# Patient Record
Sex: Male | Born: 1940
Health system: Southern US, Community
[De-identification: ages and names within clinical notes are randomized; demographics above are authoritative.]

## PROBLEM LIST (undated history)

## (undated) DIAGNOSIS — Z8679 Personal history of other diseases of the circulatory system: Secondary | ICD-10-CM

## (undated) DIAGNOSIS — I639 Cerebral infarction, unspecified: Secondary | ICD-10-CM

## (undated) DIAGNOSIS — E785 Hyperlipidemia, unspecified: Secondary | ICD-10-CM

## (undated) DIAGNOSIS — G4733 Obstructive sleep apnea (adult) (pediatric): Secondary | ICD-10-CM

## (undated) DIAGNOSIS — F329 Major depressive disorder, single episode, unspecified: Secondary | ICD-10-CM

## (undated) DIAGNOSIS — H9190 Unspecified hearing loss, unspecified ear: Secondary | ICD-10-CM

## (undated) DIAGNOSIS — F4310 Post-traumatic stress disorder, unspecified: Secondary | ICD-10-CM

## (undated) HISTORY — PX: KNEE SURGERY: SHX244

## (undated) HISTORY — PX: HAND SURGERY: SHX662

## (undated) HISTORY — DX: Personal history of other diseases of the circulatory system: Z86.79

## (undated) HISTORY — DX: Hyperlipidemia, unspecified: E78.5

## (undated) HISTORY — PX: SHOULDER ARTHROSCOPY WITH ROTATOR CUFF REPAIR: SHX5685

## (undated) HISTORY — DX: Obstructive sleep apnea (adult) (pediatric): G47.33

## (undated) HISTORY — DX: Major depressive disorder, single episode, unspecified: F32.9

## (undated) HISTORY — DX: Unspecified hearing loss, unspecified ear: H91.90

## (undated) HISTORY — DX: Cerebral infarction, unspecified: I63.9

---

## 1960-11-04 HISTORY — PX: BACK SURGERY: SHX140

## 2004-04-18 ENCOUNTER — Encounter: Admission: RE | Admit: 2004-04-18 | Discharge: 2004-06-29 | Payer: Self-pay | Admitting: Orthopedic Surgery

## 2010-05-16 ENCOUNTER — Encounter: Payer: Self-pay | Admitting: Family Medicine

## 2010-05-20 ENCOUNTER — Encounter: Payer: Self-pay | Admitting: Emergency Medicine

## 2010-05-20 ENCOUNTER — Inpatient Hospital Stay (HOSPITAL_COMMUNITY): Admission: EM | Admit: 2010-05-20 | Discharge: 2010-05-22 | Payer: Self-pay | Admitting: Internal Medicine

## 2010-05-21 ENCOUNTER — Encounter (INDEPENDENT_AMBULATORY_CARE_PROVIDER_SITE_OTHER): Payer: Self-pay | Admitting: Internal Medicine

## 2010-05-22 ENCOUNTER — Ambulatory Visit: Payer: Self-pay | Admitting: Physical Medicine & Rehabilitation

## 2010-11-29 ENCOUNTER — Ambulatory Visit
Admission: RE | Admit: 2010-11-29 | Discharge: 2010-11-29 | Payer: Self-pay | Source: Home / Self Care | Attending: Family Medicine | Admitting: Family Medicine

## 2010-11-29 DIAGNOSIS — Z8679 Personal history of other diseases of the circulatory system: Secondary | ICD-10-CM | POA: Insufficient documentation

## 2010-11-29 DIAGNOSIS — F3289 Other specified depressive episodes: Secondary | ICD-10-CM

## 2010-11-29 DIAGNOSIS — G4733 Obstructive sleep apnea (adult) (pediatric): Secondary | ICD-10-CM | POA: Insufficient documentation

## 2010-11-29 DIAGNOSIS — F329 Major depressive disorder, single episode, unspecified: Secondary | ICD-10-CM | POA: Insufficient documentation

## 2010-11-29 DIAGNOSIS — F32A Depression, unspecified: Secondary | ICD-10-CM | POA: Insufficient documentation

## 2010-11-29 DIAGNOSIS — E785 Hyperlipidemia, unspecified: Secondary | ICD-10-CM | POA: Insufficient documentation

## 2010-11-29 HISTORY — DX: Hyperlipidemia, unspecified: E78.5

## 2010-11-29 HISTORY — DX: Other specified depressive episodes: F32.89

## 2010-11-29 HISTORY — DX: Obstructive sleep apnea (adult) (pediatric): G47.33

## 2010-11-29 HISTORY — DX: Major depressive disorder, single episode, unspecified: F32.9

## 2010-11-29 HISTORY — DX: Personal history of other diseases of the circulatory system: Z86.79

## 2010-12-06 NOTE — Assessment & Plan Note (Signed)
Summary: to be est/medicare pt/njr   Vital Signs:  Patient profile:   70 year old male Height:      68.25 inches Weight:      234 pounds BMI:     35.45 Temp:     98.1 degrees F oral Pulse rate:   72 / minute Pulse rhythm:   regular Resp:     12 per minute BP sitting:   100 / 70  (left arm) Cuff size:   large  Vitals Entered By: Sid Falcon LPN (November 29, 2010 11:17 AM)  Nutrition Counseling: Patient's BMI is greater than 25 and therefore counseled on weight management options.   History of Present Illness: New pt to establish care.  PMH reviewed. Had R CVA 7/11 with L hemiplegia.  Has had about 75 % recovery.. Pt had OT and PT and continues with several; home exercises. Recent change Simvastatin to Lipitor for lipids.  Tolerating well. This was just changed about one month ago and pt is nonfasting at this time. LDL was above goal of <100 prompting change. Hx hyperlipidema, CVA, depression, and OSA.  Uses CPAP and that is working well.  Depression is stable on medication.  Preventive Screening-Counseling & Management  Alcohol-Tobacco     Smoking Status: quit     Year Started: 1953     Year Quit: 1985  Caffeine-Diet-Exercise     Does Patient Exercise: no  Allergies (verified): No Known Drug Allergies  Past History:  Family History: Last updated: 11/29/2010 Adopted.  Social History: Last updated: 11/29/2010 Married Former Smoker Alcohol use-no Regular exercise-no  Risk Factors: Exercise: no (11/29/2010)  Risk Factors: Smoking Status: quit (11/29/2010)  Past Medical History: Chicken pos depression  Hyperlipidemia Cerebrovascular accident, hx of, 05/18/2010, left arm weakness left hemiplegia Obstructive sleep apnea PMH-FH-SH reviewed for relevance  Family History: Adopted.  Social History: Married Former Smoker Alcohol use-no Regular exercise-no Smoking Status:  quit Does Patient Exercise:  no  Review of Systems  The patient denies  anorexia, fever, weight loss, weight gain, vision loss, hoarseness, chest pain, syncope, dyspnea on exertion, peripheral edema, prolonged cough, headaches, hemoptysis, abdominal pain, melena, hematochezia, severe indigestion/heartburn, hematuria, incontinence, muscle weakness, suspicious skin lesions, transient blindness, difficulty walking, depression, unusual weight change, abnormal bleeding, enlarged lymph nodes, and testicular masses.    Physical Exam  General:  Well-developed,well-nourished,in no acute distress; alert,appropriate and cooperative throughout examination Head:  normocephalic and atraumatic.   Eyes:  pupils equal, pupils round, and pupils reactive to light.   Ears:  External ear exam shows no significant lesions or deformities.  Otoscopic examination reveals clear canals, tympanic membranes are intact bilaterally without bulging, retraction, inflammation or discharge. Hearing is grossly normal bilaterally. Mouth:  Oral mucosa and oropharynx without lesions or exudates.  Teeth in good repair. Neck:  No deformities, masses, or tenderness noted. Lungs:  Normal respiratory effort, chest expands symmetrically. Lungs are clear to auscultation, no crackles or wheezes. Heart:  normal rate and regular rhythm.   Neurologic:  mild weakness LUE and LLE c/w R side.alert & oriented X3 and cranial nerves II-XII intact.     Impression & Recommendations:  Problem # 1:  CEREBROVASCULAR ACCIDENT, HX OF (ICD-V12.50)  Problem # 2:  HYPERLIPIDEMIA (ICD-272.4)  His updated medication list for this problem includes:    Lipitor 40 Mg Tabs (Atorvastatin calcium) ..... Once daily  Problem # 3:  OBSTRUCTIVE SLEEP APNEA (ICD-327.23)  Problem # 4:  DEPRESSION (ICD-311)  His updated medication list for this problem includes:  Celexa 20 Mg Tabs (Citalopram hydrobromide) ..... Once daily  Complete Medication List: 1)  Lipitor 40 Mg Tabs (Atorvastatin calcium) .... Once daily 2)  Plavix 75 Mg Tabs  (Clopidogrel bisulfate) .... Once daily 3)  Celexa 20 Mg Tabs (Citalopram hydrobromide) .... Once daily 4)  Mens Multivitamin Plus Tabs (Multiple vitamins-minerals) .... Once daily 5)  Flonase 50 Mcg/act Susp (Fluticasone propionate) .... 2 spays to each nostril at bedtime 6)  Vitamin D3 5000 Unit Tabs (Cholecalciferol) .... Once daily 7)  Fish Oil 1200 Mg Caps (Omega-3 fatty acids) .... 2 daily 8)  Super B Complex Tabs (B complex-c) .... Once daily 9)  Calcium 600 1500 Mg Tabs (Calcium carbonate) .... Once daily  Patient Instructions: 1)  Please schedule a follow-up appointment in 3 months .    Orders Added: 1)  New Patient Level III [99203]   Immunization History:  Tetanus/Td Immunization History:    Tetanus/Td:  historical (11/04/2005)  Influenza Immunization History:    Influenza:  historical (09/04/2010)  Pneumovax Immunization History:    Pneumovax:  historical (11/04/2009)   Immunization History:  Tetanus/Td Immunization History:    Tetanus/Td:  Historical (11/04/2005)  Influenza Immunization History:    Influenza:  Historical (09/04/2010)  Pneumovax Immunization History:    Pneumovax:  Historical (11/04/2009)

## 2011-01-19 LAB — DIFFERENTIAL
Eosinophils Relative: 1 % (ref 0–5)
Lymphocytes Relative: 20 % (ref 12–46)
Monocytes Absolute: 0.4 10*3/uL (ref 0.1–1.0)
Monocytes Relative: 6 % (ref 3–12)
Neutrophils Relative %: 73 % (ref 43–77)
Smear Review: ADEQUATE

## 2011-01-19 LAB — COMPREHENSIVE METABOLIC PANEL
ALT: 19 U/L (ref 0–53)
AST: 19 U/L (ref 0–37)
Albumin: 3.3 g/dL — ABNORMAL LOW (ref 3.5–5.2)
CO2: 25 mEq/L (ref 19–32)
Calcium: 8.2 mg/dL — ABNORMAL LOW (ref 8.4–10.5)
Chloride: 101 mEq/L (ref 96–112)
Creatinine, Ser: 0.82 mg/dL (ref 0.4–1.5)
GFR calc Af Amer: >60 mL/min (ref >60)
GFR calc non Af Amer: >60 mL/min (ref >60)
Sodium: 134 mEq/L — ABNORMAL LOW (ref 135–145)

## 2011-01-19 LAB — CBC
MCH: 34.7 pg — ABNORMAL HIGH (ref 26.0–34.0)
MCV: 98.1 fL (ref 78.0–100.0)
WBC: 6.6 10*3/uL (ref 4.0–10.5)

## 2011-01-19 LAB — CARDIAC PANEL(CRET KIN+CKTOT+MB+TROPI)
CK, MB: 1.2 ng/mL (ref 0.3–4.0)
Relative Index: INVALID (ref 0.0–2.5)
Relative Index: INVALID (ref 0.0–2.5)
Relative Index: INVALID (ref 0.0–2.5)
Total CK: 85 U/L (ref 7–232)
Total CK: 91 U/L (ref 7–232)
Troponin I: 0.01 ng/mL (ref 0.00–0.06)
Troponin I: <0.01 ng/mL (ref 0.00–0.06)

## 2011-01-19 LAB — LIPID PANEL: HDL: 47 mg/dL (ref >39)

## 2011-01-19 LAB — PROTIME-INR
INR: 0.98 (ref 0.00–1.49)
Prothrombin Time: 12.9 seconds (ref 11.6–15.2)

## 2011-01-19 LAB — BASIC METABOLIC PANEL
Chloride: 98 mEq/L (ref 96–112)
GFR calc Af Amer: >60 mL/min (ref >60)
GFR calc non Af Amer: >60 mL/min (ref >60)

## 2011-01-19 LAB — POCT CARDIAC MARKERS: Myoglobin, poc: 83.4 ng/mL (ref 12–200)

## 2011-01-19 LAB — HOMOCYSTEINE: Homocysteine: 6.9 umol/L (ref 4.0–15.4)

## 2011-02-22 ENCOUNTER — Encounter: Payer: Self-pay | Admitting: Family Medicine

## 2011-02-27 ENCOUNTER — Ambulatory Visit: Payer: Self-pay | Admitting: Family Medicine

## 2011-02-28 ENCOUNTER — Encounter: Payer: Self-pay | Admitting: Family Medicine

## 2011-03-01 ENCOUNTER — Ambulatory Visit (INDEPENDENT_AMBULATORY_CARE_PROVIDER_SITE_OTHER): Payer: Medicare Other | Admitting: Family Medicine

## 2011-03-01 ENCOUNTER — Encounter: Payer: Self-pay | Admitting: Family Medicine

## 2011-03-01 DIAGNOSIS — G4733 Obstructive sleep apnea (adult) (pediatric): Secondary | ICD-10-CM

## 2011-03-01 DIAGNOSIS — E785 Hyperlipidemia, unspecified: Secondary | ICD-10-CM

## 2011-03-01 DIAGNOSIS — R7309 Other abnormal glucose: Secondary | ICD-10-CM

## 2011-03-01 DIAGNOSIS — Z8679 Personal history of other diseases of the circulatory system: Secondary | ICD-10-CM

## 2011-03-01 DIAGNOSIS — R739 Hyperglycemia, unspecified: Secondary | ICD-10-CM

## 2011-03-01 NOTE — Progress Notes (Signed)
  Subjective:    Patient ID: Richard Fernandez, male    DOB: April 17, 1941, 70 y.o.   MRN: [id number]  HPI Patient has history of cerebrovascular disease with stroke last summer, hyperlipidemia, depression, and obstructive sleep apnea. He takes Nuvigil per neurologist for daytime somnolence and that seems to be helping. Uses CPAP. Had changed from simvastatin to Lipitor several months ago secondary to elevated LDL. No followup lipid since then. Also a prior history of hyperglycemia but no diagnosis of diabetes. No symptoms of hyperglycemia. Continues to do home exercises. No recent focal weakness   Review of Systems  Constitutional: Negative for appetite change and unexpected weight change.  Respiratory: Negative for shortness of breath.   Cardiovascular: Negative for chest pain.  Neurological: Negative for dizziness and weakness.       Objective:   Physical Exam  Constitutional: He appears well-developed and well-nourished.  HENT:  Mouth/Throat: Oropharynx is clear and moist. No oropharyngeal exudate.  Cardiovascular: Normal rate, regular rhythm and normal heart sounds.   Pulmonary/Chest: Effort normal and breath sounds normal. No respiratory distress. He has no wheezes. He has no rales.  Musculoskeletal: He exhibits no edema.          Assessment & Plan:  #1 hyperlipidemia. Schedule fasting lipid and hepatic panel. #2 history cerebrovascular disease. #3 history of reported prediabetes. Schedule followup basic metabolic panel fasting

## 2011-03-14 ENCOUNTER — Other Ambulatory Visit (INDEPENDENT_AMBULATORY_CARE_PROVIDER_SITE_OTHER): Payer: Medicare Other | Admitting: Family Medicine

## 2011-03-14 DIAGNOSIS — R739 Hyperglycemia, unspecified: Secondary | ICD-10-CM

## 2011-03-14 DIAGNOSIS — E785 Hyperlipidemia, unspecified: Secondary | ICD-10-CM

## 2011-03-14 DIAGNOSIS — R7309 Other abnormal glucose: Secondary | ICD-10-CM

## 2011-03-14 LAB — BASIC METABOLIC PANEL
BUN: 13 mg/dL (ref 6–23)
Calcium: 9 mg/dL (ref 8.4–10.5)
GFR: 124.67 mL/min (ref >60.00)
Glucose, Bld: 97 mg/dL (ref 70–99)
Sodium: 138 mEq/L (ref 135–145)

## 2011-03-14 LAB — HEPATIC FUNCTION PANEL
ALT: 28 U/L (ref 0–53)
AST: 20 U/L (ref 0–37)
Bilirubin, Direct: 0.1 mg/dL (ref 0.0–0.3)
Total Bilirubin: 0.6 mg/dL (ref 0.3–1.2)

## 2011-03-15 NOTE — Progress Notes (Signed)
Quick Note:  Pt informed on home VM ______ 

## 2011-03-21 ENCOUNTER — Telehealth: Payer: Self-pay | Admitting: Family Medicine

## 2011-03-21 NOTE — Telephone Encounter (Signed)
Requesting lab results from last week.

## 2011-03-21 NOTE — Telephone Encounter (Signed)
Message left on [phone] (number was not on his chart, so I added it as his cell).  Informed pt on VM that his labs are OK and message was left on him home phon VM on 5/11.

## 2011-03-29 ENCOUNTER — Other Ambulatory Visit: Payer: Self-pay | Admitting: Family Medicine

## 2011-04-22 ENCOUNTER — Other Ambulatory Visit: Payer: Self-pay | Admitting: Family Medicine

## 2011-05-20 ENCOUNTER — Ambulatory Visit (HOSPITAL_BASED_OUTPATIENT_CLINIC_OR_DEPARTMENT_OTHER)
Admission: RE | Admit: 2011-05-20 | Discharge: 2011-05-20 | Disposition: A | Discharge status: Home / Self Care | Payer: Medicare Other | Source: Ambulatory Visit | Attending: Orthopedic Surgery | Admitting: Orthopedic Surgery

## 2011-05-20 DIAGNOSIS — M65839 Other synovitis and tenosynovitis, unspecified forearm: Secondary | ICD-10-CM | POA: Insufficient documentation

## 2011-05-20 LAB — POCT I-STAT, CHEM 8
BUN: 14 mg/dL (ref 6–23)
Calcium, Ion: 0.97 mmol/L — ABNORMAL LOW (ref 1.12–1.32)
Chloride: 104 meq/L (ref 96–112)
Creatinine, Ser: 0.7 mg/dL (ref 0.50–1.35)
Glucose, Bld: 103 mg/dL — ABNORMAL HIGH (ref 70–99)
HCT: 41 % (ref 39.0–52.0)
Hemoglobin: 13.9 g/dL (ref 13.0–17.0)
Potassium: 4.2 meq/L (ref 3.5–5.1)
Sodium: 135 meq/L (ref 135–145)
TCO2: 26 mmol/L (ref 0–100)

## 2011-05-24 NOTE — Op Note (Signed)
Richard Fernandez, Richard Fernandez                ACCOUNT NO.:  [id number]  MEDICAL RECORD NO.:  [id number]  LOCATION:                                 FACILITY:  PHYSICIAN:  Cindee Salt, M.D.            DATE OF BIRTH:  DATE OF PROCEDURE:  05/20/2011 DATE OF DISCHARGE:                              OPERATIVE REPORT   PREOPERATIVE DIAGNOSIS:  Stenosing tenosynovitis, left middle, ring and little fingers.  POSTOPERATIVE DIAGNOSIS:  Stenosing tenosynovitis, left middle, ring and little fingers.  OPERATION:  Release of A1 pulley, left middle, ring, and little fingers.  SURGEON:  Cindee Salt, MD  ANESTHESIA:  Forearm-based IV regional with local infiltration.  HISTORY:  The patient is a 70 year old male with a history of triggering of his left middle, ring, and little finger.  This has not responded to conservative treatment.  He has elected to undergo surgical release of each.  Pre and postoperative course have been discussed along with risks and complications.  He is aware that there is no guarantee with surgery, possibility of infection, recurrence, injury to arteries, nerves, and tendons, incomplete relief of symptoms, and dystrophy.  In the preoperative area, the patient is seen.  The extremity marked by both the patient and surgeon.  Antibiotic given.  PROCEDURE:  The patient is brought to the operating room where a forearm- based IV regional anesthetic was carried out without difficulty.  He was prepped using ChloraPrep, supine position, left arm free.  A 3-minute dry time was allowed.  Time-out taken confirming the patient and procedure.  An oblique incision was made over the left little finger, carried down through subcutaneous tissue.  Bleeders were electrocauterized with bipolar.  The neurovascular bundle was identified.  The A1 pulley identified and released in its radial aspect. Small incision made centrally in A2.  A partial tenosynovectomy was performed and there was adherence of  the superficialis profundus. Finger placed through full range of motion.  No further triggering was noted.  A separate incision was then made over the ring finger and again carried down through subcutaneous tissue.  Bleeders again electrocauterized with bipolar.  Neurovascular structures identified and protected with retractors.  An incision was made on the radial aspect of the A1 pulley.  A small incision made centrally in A2.  Partial tenosynovectomy performed.  Again, full range of motion was noted.  A separate incision was then made over the left middle finger A1 pulley, carried down through subcutaneous tissue.  Bleeders were again electrocauterized with bipolar.  The A1 pulley incised on its radial aspect after protection of neurovascular bundles radially and ulnarly. A small incision was made centrally in A2.  Partial tenosynovectomy was performed.  The finger placed through full range motion.  No further triggering was noted.  The wounds were copiously irrigated with saline and closed with interrupted 4-0 Vicryl Rapide suture.  Local infiltration with 0.25% Marcaine without epinephrine, 7 mL was used.  A sterile compressive dressing was applied.  On deflation of tourniquet, all fingers immediately pinked. He could fully flex and extend his fingers without triggering.  The patient tolerated the procedure well and was taken  to the recovery room for observation in satisfactory condition.  He will be discharged home to return to [facility] [place] in 1 week on Vicodin.          ______________________________ Cindee Salt, M.D.     GK/MEDQ  D:  05/20/2011  T:  05/21/2011  Job:  [id number]  Electronically Signed by Cindee Salt M.D. on 05/24/2011 09:20:28 AM

## 2011-05-28 ENCOUNTER — Other Ambulatory Visit: Payer: Self-pay | Admitting: Family Medicine

## 2011-07-23 ENCOUNTER — Telehealth: Payer: Self-pay | Admitting: Family Medicine

## 2011-07-23 NOTE — Telephone Encounter (Signed)
Would not recommend going up to 60mg .  Too much risk of side effects, esp fatigue at this dose. Recommend office follow up to discuss options.

## 2011-07-23 NOTE — Telephone Encounter (Signed)
Pt would like to increase generic celexa 40 mg to ?60 mg. Pharm [facility] [address] [phone]

## 2011-07-24 NOTE — Telephone Encounter (Signed)
Pt is sch for ov 07-25-2011 115pm

## 2011-07-25 ENCOUNTER — Encounter: Payer: Self-pay | Admitting: Family Medicine

## 2011-07-25 ENCOUNTER — Telehealth: Payer: Self-pay | Admitting: Family Medicine

## 2011-07-25 ENCOUNTER — Ambulatory Visit (INDEPENDENT_AMBULATORY_CARE_PROVIDER_SITE_OTHER): Payer: Medicare Other | Admitting: Family Medicine

## 2011-07-25 VITALS — BP 110/68 | Temp 97.7°F | Wt 236.0 lb

## 2011-07-25 DIAGNOSIS — Z23 Encounter for immunization: Secondary | ICD-10-CM

## 2011-07-25 DIAGNOSIS — F329 Major depressive disorder, single episode, unspecified: Secondary | ICD-10-CM

## 2011-07-25 MED ORDER — CITALOPRAM HYDROBROMIDE 40 MG PO TABS: 40.0000 mg | ORAL_TABLET | Freq: Every day | ORAL | Status: DC

## 2011-07-25 MED ORDER — BUPROPION HCL ER (XL) 150 MG PO TB24: 150.0000 mg | ORAL_TABLET | Freq: Every day | ORAL | Status: DC

## 2011-07-25 NOTE — Telephone Encounter (Signed)
Pt called and said that his Celexa is supposed to be 40 mg not 20 mg. Pls correct to 40 mg and send to [facility] on [address].

## 2011-07-25 NOTE — Telephone Encounter (Signed)
Updated dose sent to pt pharmacy

## 2011-07-25 NOTE — Progress Notes (Signed)
  Subjective:    Patient ID: Richard Fernandez, male    DOB: 11/16/1940, 70 y.o.   MRN: [id number]  HPI Patient has history of obstructive sleep apnea, depression, hyperlipidemia, and CVA. He has seen neurologist regarding his obstructive sleep apnea. He takes Provigil but has had some recent issues of cost. He has had some recent depression issues but no suicidal ideation. Currently takes Celexa 40 mg daily. Still feels anxious at times and depressed mood. General sense of fatigue.  Medications reviewed.  Compliant with all with the exception of difficulties getting Provigil as above   Review of Systems  Constitutional: Positive for fatigue. Negative for fever, chills and unexpected weight change.  Respiratory: Negative for shortness of breath.   Cardiovascular: Negative for chest pain, palpitations and leg swelling.  Gastrointestinal: Negative for abdominal pain.  Neurological: Negative for seizures and headaches.  Psychiatric/Behavioral: Positive for dysphoric mood.       Objective:   Physical Exam  Constitutional: He appears well-developed and well-nourished. No distress.  Cardiovascular: Normal rate and regular rhythm.   Pulmonary/Chest: Effort normal and breath sounds normal. No respiratory distress. He has no wheezes. He has no rales.  Psychiatric: His behavior is normal. Thought content normal.          Assessment & Plan:  #1 depression. Recent increase in depressed mood and some increased anxiety as well. Add Wellbutrin XL 150 mg once daily and reassess one month. Flu vaccine given

## 2011-08-23 ENCOUNTER — Ambulatory Visit: Payer: Medicare Other | Admitting: Family Medicine

## 2011-08-27 ENCOUNTER — Encounter: Payer: Self-pay | Admitting: Family Medicine

## 2011-08-27 ENCOUNTER — Ambulatory Visit (INDEPENDENT_AMBULATORY_CARE_PROVIDER_SITE_OTHER): Payer: Medicare Other | Admitting: Family Medicine

## 2011-08-27 VITALS — BP 110/70 | Temp 98.3°F | Wt 236.0 lb

## 2011-08-27 DIAGNOSIS — F329 Major depressive disorder, single episode, unspecified: Secondary | ICD-10-CM

## 2011-08-27 NOTE — Progress Notes (Signed)
  Subjective:    Patient ID: Richard Fernandez, male    DOB: 12-21-1940, 70 y.o.   MRN: [id number]  HPI  Patient seen for followup. History of depression. Added Wellbutrin last visit and is seeing great improvement. No side effects. Previously had taken Provigil per neurologist but cost was prohibitive and he had side effects. He is very pleased with combination of Celexa and Wellbutrin. Patient is focused now on losing some weight. Requesting information regarding this. Exercising 3 days per week.   Review of Systems  Constitutional: Negative for appetite change and unexpected weight change.  Respiratory: Negative for cough and shortness of breath.   Cardiovascular: Negative for chest pain.  Neurological: Negative for headaches.  Psychiatric/Behavioral: Negative for dysphoric mood and agitation. The patient is not nervous/anxious.        Objective:   Physical Exam  Constitutional: He is oriented to person, place, and time. He appears well-developed and well-nourished.  Neck: Neck supple.  Cardiovascular: Normal rate and regular rhythm.   Pulmonary/Chest: Effort normal and breath sounds normal. No respiratory distress. He has no wheezes. He has no rales.  Neurological: He is alert and oriented to person, place, and time.  Psychiatric: He has a normal mood and affect. His behavior is normal.          Assessment & Plan:  Depression improved. Continue current medication. Patient given information on low calorie diet

## 2011-08-27 NOTE — Patient Instructions (Signed)

## 2011-11-28 ENCOUNTER — Other Ambulatory Visit: Payer: Self-pay | Admitting: *Deleted

## 2011-11-28 MED ORDER — CLOPIDOGREL BISULFATE 75 MG PO TABS: 75.0000 mg | ORAL_TABLET | Freq: Every day | ORAL | Status: DC

## 2012-01-28 ENCOUNTER — Encounter: Payer: Self-pay | Admitting: Internal Medicine

## 2012-01-28 ENCOUNTER — Ambulatory Visit (INDEPENDENT_AMBULATORY_CARE_PROVIDER_SITE_OTHER): Payer: Medicare Other | Admitting: Internal Medicine

## 2012-01-28 VITALS — BP 120/70 | Temp 98.3°F | Wt 243.0 lb

## 2012-01-28 DIAGNOSIS — Z8679 Personal history of other diseases of the circulatory system: Secondary | ICD-10-CM

## 2012-01-28 DIAGNOSIS — J069 Acute upper respiratory infection, unspecified: Secondary | ICD-10-CM

## 2012-01-28 MED ORDER — HYDROCODONE-HOMATROPINE 5-1.5 MG/5ML PO SYRP: 5.0000 mL | ORAL_SOLUTION | Freq: Four times a day (QID) | ORAL | Status: AC | PRN

## 2012-01-28 NOTE — Patient Instructions (Signed)
Get plenty of rest, Drink lots of  clear liquids, and use Tylenol or ibuprofen for fever and discomfort.    Use saline irrigation, warm  moist compresses and over-the-counter decongestants only as directed.  Call if there is no improvement in 5 to 7 days, or sooner if you develop increasing pain, fever, or any new symptoms. 

## 2012-01-28 NOTE — Progress Notes (Signed)
  Subjective:    Patient ID: Richard Fernandez, male    DOB: Feb 02, 1941, 71 y.o.   MRN: [id number]  HPI   71 year old patient who presents with a three-day history of cough congestion and episodic nausea. There's been considerable secretions with postnasal drip and he has been experiencing some early morning nausea. 2 days ago there is some brief fever but more recently has been afebrile. He generally feels a bit unwell. He has not missed any work. He has a history of cerebral vascular disease   Review of Systems  Constitutional: Negative for fever, chills, appetite change and fatigue.  HENT: Positive for congestion, rhinorrhea and postnasal drip. Negative for hearing loss, ear pain, sore throat, trouble swallowing, neck stiffness, dental problem, voice change and tinnitus.   Eyes: Negative for pain, discharge and visual disturbance.  Respiratory: Positive for cough. Negative for chest tightness, wheezing and stridor.   Cardiovascular: Negative for chest pain, palpitations and leg swelling.  Gastrointestinal: Positive for nausea. Negative for vomiting, abdominal pain, diarrhea, constipation, blood in stool and abdominal distention.  Genitourinary: Negative for urgency, hematuria, flank pain, discharge, difficulty urinating and genital sores.  Musculoskeletal: Negative for myalgias, back pain, joint swelling, arthralgias and gait problem.  Skin: Negative for rash.  Neurological: Negative for dizziness, syncope, speech difficulty, weakness, numbness and headaches.  Hematological: Negative for adenopathy. Does not bruise/bleed easily.  Psychiatric/Behavioral: Negative for behavioral problems and dysphoric mood. The patient is not nervous/anxious.        Objective:   Physical Exam  Constitutional: He is oriented to person, place, and time. He appears well-developed.       Overweight. No distress Afebrile. Blood pressure 120/70  HENT:  Head: Normocephalic.  Right Ear: External ear normal.  Left  Ear: External ear normal.       Hearing aid present on the right  Eyes: Conjunctivae and EOM are normal.  Neck: Normal range of motion.  Cardiovascular: Normal rate and regular rhythm.   Murmur heard.      Grade 2/6 systolic murmur heard loudest at the base  Pulmonary/Chest: Breath sounds normal.  Abdominal: Bowel sounds are normal. He exhibits no distension. There is no tenderness. There is no rebound.  Musculoskeletal: Normal range of motion. He exhibits no edema and no tenderness.  Neurological: He is alert and oriented to person, place, and time.  Psychiatric: He has a normal mood and affect. His behavior is normal.          Assessment & Plan:   Viral syndrome with cough. Nausea probably secondary to copious secretions. We'll treat with Hydromet and Tylenol and clinically observe and

## 2012-02-25 ENCOUNTER — Ambulatory Visit: Payer: Medicare Other | Admitting: Family Medicine

## 2012-02-26 ENCOUNTER — Encounter: Payer: Self-pay | Admitting: Family Medicine

## 2012-02-26 ENCOUNTER — Ambulatory Visit (INDEPENDENT_AMBULATORY_CARE_PROVIDER_SITE_OTHER): Payer: Medicare Other | Admitting: Family Medicine

## 2012-02-26 VITALS — BP 100/70 | Temp 98.0°F | Wt 239.0 lb

## 2012-02-26 DIAGNOSIS — Z8679 Personal history of other diseases of the circulatory system: Secondary | ICD-10-CM

## 2012-02-26 DIAGNOSIS — E785 Hyperlipidemia, unspecified: Secondary | ICD-10-CM

## 2012-02-26 DIAGNOSIS — F329 Major depressive disorder, single episode, unspecified: Secondary | ICD-10-CM

## 2012-02-26 LAB — BASIC METABOLIC PANEL
CO2: 29 mEq/L (ref 19–32)
Calcium: 8.8 mg/dL (ref 8.4–10.5)
Creatinine, Ser: 0.7 mg/dL (ref 0.4–1.5)
Glucose, Bld: 71 mg/dL (ref 70–99)
Sodium: 136 mEq/L (ref 135–145)

## 2012-02-26 LAB — HEPATIC FUNCTION PANEL
AST: 22 U/L (ref 0–37)
Alkaline Phosphatase: 49 U/L (ref 39–117)
Total Bilirubin: 0.6 mg/dL (ref 0.3–1.2)

## 2012-02-26 LAB — LIPID PANEL: Total CHOL/HDL Ratio: 3

## 2012-02-26 MED ORDER — BUPROPION HCL ER (XL) 300 MG PO TB24: 300.0000 mg | ORAL_TABLET | Freq: Every day | ORAL | Status: AC

## 2012-02-26 NOTE — Progress Notes (Signed)
  Subjective:    Patient ID: Richard Fernandez, male    DOB: [birth year], 71 y.o.   MRN: [id number]  HPI  Patient seen for followup regarding his hyperlipidemia, history of cerebrovascular disease, and depression. Also has obstructive sleep apnea and uses CPAP regularly.  Has later day fatigue > somnolence.  He's had some late day fatigue. Overall mood fairly stable. He would like to consider titrating Wellbutrin. He noticed some improvement in reduction of fatigue when starting 150 mg. No side effects.  Blood pressures have never been elevated. No history of diabetes. Compliant with all medications. Denies side effects.  Past Medical History  Diagnosis Date  . HYPERLIPIDEMIA 11/29/2010  . DEPRESSION 11/29/2010  . OBSTRUCTIVE SLEEP APNEA 11/29/2010  . CEREBROVASCULAR ACCIDENT, HX OF 11/29/2010   No past surgical history on file.  reports that he has quit smoking. He does not have any smokeless tobacco history on file. He reports that he does not drink alcohol or use illicit drugs. family history is not on file.  He is adopted. No Known Allergies    Review of Systems  Constitutional: Negative for appetite change, fatigue and unexpected weight change.  Eyes: Negative for visual disturbance.  Respiratory: Negative for cough, chest tightness and shortness of breath.   Cardiovascular: Negative for chest pain, palpitations and leg swelling.  Gastrointestinal: Negative for abdominal pain.  Neurological: Negative for dizziness, syncope, weakness, light-headedness and headaches.  Psychiatric/Behavioral: Positive for dysphoric mood.       Objective:   Physical Exam  Constitutional: He is oriented to person, place, and time. He appears well-developed and well-nourished.  Neck: Neck supple. No thyromegaly present.  Cardiovascular: Normal rate and regular rhythm.   Pulmonary/Chest: Effort normal and breath sounds normal. No respiratory distress. He has no wheezes. He has no rales.  Musculoskeletal:  He exhibits no edema.  Neurological: He is alert and oriented to person, place, and time. No cranial nerve deficit.          Assessment & Plan:  #1 hyperlipidemia. Check lipid and hepatic panel #2 history of depression. He has ongoing fatigue issues and some low motivation. Titrate Wellbutrin XL 300 mg daily. Follow up 3-4 weeks if no improvement #3 history of cerebrovascular disease. Continue Plavix. Blood pressure well-controlled.  #4 obstructive sleep apnea on CPAP

## 2012-02-27 ENCOUNTER — Telehealth: Payer: Self-pay | Admitting: Family Medicine

## 2012-02-27 NOTE — Progress Notes (Signed)
Quick Note:  Pt informed ______ 

## 2012-02-27 NOTE — Telephone Encounter (Signed)
Pt called and said that he had his shingles vax on 02/11/09. Pt said that Dr Caryl Never nurse had called him earlier today and had asked for this info.

## 2012-02-28 NOTE — Telephone Encounter (Signed)
Documented in vaccines

## 2012-04-15 ENCOUNTER — Other Ambulatory Visit: Payer: Self-pay | Admitting: Family Medicine

## 2012-05-19 ENCOUNTER — Telehealth: Payer: Self-pay | Admitting: Family Medicine

## 2012-05-19 NOTE — Telephone Encounter (Signed)
Get form for handicap parking and I will complete.

## 2012-05-19 NOTE — Telephone Encounter (Signed)
Pt requesting a form to be filled out for handicap tag. Please contact pt

## 2012-05-20 NOTE — Telephone Encounter (Signed)
Pt informed ready to pick up

## 2012-06-17 ENCOUNTER — Other Ambulatory Visit: Payer: Self-pay | Admitting: Family Medicine

## 2012-06-18 ENCOUNTER — Other Ambulatory Visit: Payer: Self-pay | Admitting: *Deleted

## 2012-06-18 MED ORDER — SIMVASTATIN 40 MG PO TABS: 40.0000 mg | ORAL_TABLET | Freq: Every day | ORAL | Status: DC

## 2012-08-12 ENCOUNTER — Encounter: Payer: Self-pay | Admitting: Family Medicine

## 2012-08-12 ENCOUNTER — Ambulatory Visit (INDEPENDENT_AMBULATORY_CARE_PROVIDER_SITE_OTHER): Payer: Medicare Other | Admitting: Family Medicine

## 2012-08-12 VITALS — BP 110/70 | Temp 98.2°F | Wt 230.0 lb

## 2012-08-12 DIAGNOSIS — Z8679 Personal history of other diseases of the circulatory system: Secondary | ICD-10-CM

## 2012-08-12 DIAGNOSIS — R5381 Other malaise: Secondary | ICD-10-CM

## 2012-08-12 DIAGNOSIS — Z23 Encounter for immunization: Secondary | ICD-10-CM

## 2012-08-12 DIAGNOSIS — R5383 Other fatigue: Secondary | ICD-10-CM

## 2012-08-12 DIAGNOSIS — E785 Hyperlipidemia, unspecified: Secondary | ICD-10-CM

## 2012-08-12 NOTE — Progress Notes (Signed)
  Subjective:    Patient ID: Richard Fernandez, male    DOB: 1941-01-16, 71 y.o.   MRN: [id number]  HPI  Medical followup. Patient history of hyperlipidemia, depression, obstructive sleep apnea, and remote history of stroke. Medications reviewed. We titrated his Wellbutrin last visit. Initially this seemed to help his fatigue issues. His mood is relatively stable. He's having some progressive fatigue. Generally sleeping well. Uses CPAP consistently. Frequently has fatigue throughout the day. Some daytime somnolence. No chest pains. No dyspnea. Appetite is fair. Has lost 9 pounds since April due to his efforts. He is trying to exercise couple days per week.   Review of Systems  Constitutional: Positive for fatigue. Negative for fever, chills and appetite change.  Respiratory: Negative for cough and shortness of breath.   Cardiovascular: Negative for chest pain, palpitations and leg swelling.  Gastrointestinal: Negative for abdominal pain.  Genitourinary: Negative for dysuria.  Neurological: Negative for dizziness.  Psychiatric/Behavioral: Negative for dysphoric mood.       Objective:   Physical Exam  Constitutional: He appears well-developed and well-nourished.  Neck: Neck supple.  Cardiovascular: Normal rate and regular rhythm.   Pulmonary/Chest: Effort normal and breath sounds normal. No respiratory distress. He has no wheezes. He has no rales.  Musculoskeletal: He exhibits no edema.  Lymphadenopathy:    He has no cervical adenopathy.          Assessment & Plan:  Fatigue. Question multifactorial. Depression appears to be stable. Check TSH and CBC. Consider reassessing sleep apnea and CPAP if symptoms persist. Increase activity levels  Depression stable -continue current medications  Hyperlipidemia. Lipids were checked last visit and near goal. Continue simvastatin 40 mg daily

## 2012-08-13 ENCOUNTER — Other Ambulatory Visit: Payer: Self-pay | Admitting: *Deleted

## 2012-08-13 DIAGNOSIS — R5383 Other fatigue: Secondary | ICD-10-CM

## 2012-08-13 LAB — CBC WITH DIFFERENTIAL/PLATELET
Basophils Absolute: 0.1 10*3/uL (ref 0.0–0.1)
Eosinophils Absolute: 0.1 10*3/uL (ref 0.0–0.7)
Hemoglobin: 13.6 g/dL (ref 13.0–17.0)
Lymphocytes Relative: 30.5 % (ref 12.0–46.0)
MCHC: 34.8 g/dL (ref 30.0–36.0)
Monocytes Absolute: 0.5 10*3/uL (ref 0.1–1.0)
Neutro Abs: 3.6 10*3/uL (ref 1.4–7.7)
RDW: 14 % (ref 11.5–14.6)

## 2012-08-13 NOTE — Progress Notes (Signed)
Quick Note:  Pt informed, B-12 will be added, pt scheduled for lab 10:30 am Friday ______

## 2012-08-14 ENCOUNTER — Telehealth: Payer: Self-pay | Admitting: Family Medicine

## 2012-08-14 ENCOUNTER — Other Ambulatory Visit (INDEPENDENT_AMBULATORY_CARE_PROVIDER_SITE_OTHER): Payer: Medicare Other

## 2012-08-14 DIAGNOSIS — R5381 Other malaise: Secondary | ICD-10-CM

## 2012-08-14 DIAGNOSIS — Z79899 Other long term (current) drug therapy: Secondary | ICD-10-CM

## 2012-08-14 DIAGNOSIS — R5383 Other fatigue: Secondary | ICD-10-CM

## 2012-08-14 NOTE — Telephone Encounter (Signed)
Pt stated a rx was suppose to be call into [facility] yesterday.

## 2012-08-14 NOTE — Telephone Encounter (Signed)
Pt informed we need his b-12 lab result first and then discuss need for med.

## 2012-08-17 NOTE — Progress Notes (Signed)
Quick Note:  Pt informed ______ 

## 2012-08-24 ENCOUNTER — Telehealth: Payer: Self-pay | Admitting: *Deleted

## 2012-08-24 DIAGNOSIS — G4733 Obstructive sleep apnea (adult) (pediatric): Secondary | ICD-10-CM

## 2012-08-24 NOTE — Telephone Encounter (Signed)
Received fax from pt, "Tests came back normal, I'm still having tired and sleepy feeling, what now"?   [phone]

## 2012-08-25 NOTE — Telephone Encounter (Signed)
If patient willing, let's refer to Dr Marcelyn Bruins (specializes in sleep apnea) to reassess his OSA.

## 2012-08-25 NOTE — Telephone Encounter (Signed)
I spoke with pt, and he would like referral to Marcelyn Bruins MD to reassess OSA.  Will refer

## 2012-09-17 ENCOUNTER — Encounter: Payer: Self-pay | Admitting: Pulmonary Disease

## 2012-09-17 ENCOUNTER — Ambulatory Visit (INDEPENDENT_AMBULATORY_CARE_PROVIDER_SITE_OTHER): Payer: Medicare Other | Admitting: Pulmonary Disease

## 2012-09-17 VITALS — BP 140/70 | HR 65 | Temp 97.6°F | Ht 68.0 in | Wt 228.0 lb

## 2012-09-17 DIAGNOSIS — G4733 Obstructive sleep apnea (adult) (pediatric): Secondary | ICD-10-CM

## 2012-09-17 NOTE — Patient Instructions (Addendum)
Will have the pressure output checked on your machine Will use an auto device for 2 weeks to re-optimize your pressure.  Will call you with results. If we see that your machine is working properly, and the pressure is adequate, your symptoms may be coming from something other than your sleep apnea.

## 2012-09-17 NOTE — Assessment & Plan Note (Signed)
The patient has a history of mild to moderate obstructive sleep apnea, and has been doing well in the past on CPAP.  Currently he is having increased sleepiness and fatigue during the day, and is not as rested in the mornings upon arising.  He thinks his CPAP machine is in good working order, and denies any issues with his mask or leaking.  I would like to optimize his pressure again on an automatic device, and if he is at an adequate pressure, more than likely his symptoms are coming from something other than his sleep apnea.  I have also encouraged him to work aggressively on weight loss.

## 2012-09-17 NOTE — Progress Notes (Signed)
  Subjective:    Patient ID: Richard Fernandez, male    DOB: Jun 12, 1941, 71 y.o.   MRN: [id number]  HPI The patient is a very pleasant 71 year old male who I've been asked to see for management of obstructive sleep apnea.  He was diagnosed in 2011 with mild obstructive sleep apnea, with an AHI of 16 events per hour.  He was titrated to an optimal CPAP pressure of 12 cm, and has been wearing CPAP since that time.  He feels that he has responded well to therapy, but most recently has been having issues with significant fatigue and sleepiness during the day.  He uses a full face mask but is working well, with no significant leaking.  His wife has not heard breakthrough snoring, but he feels unrested at least 50% of the mornings.  He notes sleep pressure with any period of inactivity, but has no sleepiness with driving.  The patient states that his weight is down 15-20 pounds over the last 2 years, and his Epworth score today is 3  Sleep Questionnaire: What time do you typically go to bed?( Between what hours) 1030pm to 5:30 am How long does it take you to fall asleep? no long How many times during the night do you wake up? 1 What time do you get out of bed to start your day? 0530 Do you drive or operate heavy machinery in your occupation? No How much has your weight changed (up or down) over the past two years? (In pounds) Have you ever had a sleep study before? Yes If yes, location of study? [facility] If yes, date of study? Do you currently use CPAP? Yes If so, what pressure? 12 Do you wear oxygen at any time? No    Review of Systems  Constitutional: Negative for fever and unexpected weight change.  HENT: Negative for ear pain, nosebleeds, congestion, sore throat, rhinorrhea, sneezing, trouble swallowing, dental problem, postnasal drip and sinus pressure.   Eyes: Negative for redness and itching.  Respiratory: Negative for cough, chest tightness, shortness of breath and wheezing.   Cardiovascular:  Positive for leg swelling. Negative for palpitations.  Gastrointestinal: Negative for nausea and vomiting.  Genitourinary: Negative for dysuria.  Musculoskeletal: Positive for joint swelling.  Skin: Negative for rash.  Neurological: Negative for headaches.  Hematological: Bruises/bleeds easily.  Psychiatric/Behavioral: Positive for dysphoric mood. The patient is nervous/anxious.        Objective:   Physical Exam Constitutional:  Overweight male, no acute distress  HENT:  Nares patent without discharge, but septal deviation to left with narrowing.   Oropharynx without exudate, palate and uvula are moderately elongated.  Eyes:  Perrla, eomi, no scleral icterus  Neck:  No JVD, no TMG  Cardiovascular:  Normal rate, regular rhythm, no rubs or gallops.  2/6 sem        Intact distal pulses but decreased.  Pulmonary :  Normal breath sounds, no stridor or respiratory distress   No rales, rhonchi, or wheezing  Abdominal:  Soft, nondistended, bowel sounds present.  No tenderness noted.   Musculoskeletal:  2+ lower extremity edema noted.  Lymph Nodes:  No cervical lymphadenopathy noted  Skin:  No cyanosis noted  Neurologic:  Alert, appropriate, moves all 4 extremities without obvious deficit.         Assessment & Plan:

## 2012-09-23 ENCOUNTER — Telehealth: Payer: Self-pay | Admitting: Pulmonary Disease

## 2012-09-23 NOTE — Telephone Encounter (Signed)
refaxed order to [facility]

## 2012-09-23 NOTE — Telephone Encounter (Signed)
Called, spoke with pt.  States [facility] he is saying they never received order from 09/17/12 for cpap titration.  Looks like order was faxed.  [facility], can you pls refax this and make sure it is received.  Thank you.

## 2012-10-09 ENCOUNTER — Telehealth: Payer: Self-pay | Admitting: Family Medicine

## 2012-10-09 NOTE — Telephone Encounter (Signed)
Will watch for faxed request form

## 2012-10-09 NOTE — Telephone Encounter (Signed)
Pt called to check on status of request for diabetic shoes from [employer]. Pt has an appt with them today. Pls fax over a script for diabetic tech. Fax # [phone].

## 2012-10-16 NOTE — Telephone Encounter (Signed)
I called pt, he states this has all been taken care of.

## 2012-10-19 ENCOUNTER — Encounter: Payer: Self-pay | Admitting: Family Medicine

## 2012-10-19 ENCOUNTER — Ambulatory Visit (INDEPENDENT_AMBULATORY_CARE_PROVIDER_SITE_OTHER): Payer: Medicare Other | Admitting: Family Medicine

## 2012-10-19 VITALS — BP 120/64 | Temp 97.0°F | Wt 238.0 lb

## 2012-10-19 DIAGNOSIS — R609 Edema, unspecified: Secondary | ICD-10-CM

## 2012-10-19 DIAGNOSIS — R600 Localized edema: Secondary | ICD-10-CM

## 2012-10-19 NOTE — Progress Notes (Signed)
  Subjective:    Patient ID: Richard Fernandez, male    DOB: [birth year], 71 y.o.   MRN: [id number]  HPI  Patient complains of some mild increased edema legs bilaterally. Present for several weeks and possibly months. No dyspnea. No orthopnea. No dietary changes. No medication changes other than addition of VESIcare per urology for urinary urgency symptoms. Patient has history of obstructive sleep apnea. Recent auto titration study. He feels he is sleeping fairly well.  Weight stable. No history of CHF.     Review of Systems  Respiratory: Negative for cough, shortness of breath and wheezing.   Cardiovascular: Positive for leg swelling. Negative for chest pain and palpitations.  Genitourinary: Negative for dysuria.  Neurological: Negative for dizziness and weakness.       Objective:   Physical Exam  Constitutional: He appears well-developed and well-nourished.  Neck: Neck supple. No thyromegaly present.  Cardiovascular: Normal rate and regular rhythm.   Pulmonary/Chest: Effort normal and breath sounds normal. No respiratory distress. He has no wheezes. He has no rales.  Musculoskeletal:       Only very trace edema lower legs bilaterally-nonpitting          Assessment & Plan:  Lower leg edema. Mild and chronic. Our computer system was down today so we did not obtain any labs. Suspect venous stasis. Elevate legs frequently. Venous compression garments prescribed knee-high

## 2012-10-23 ENCOUNTER — Telehealth: Payer: Self-pay | Admitting: Pulmonary Disease

## 2012-10-23 ENCOUNTER — Telehealth: Payer: Self-pay | Admitting: Family Medicine

## 2012-10-23 NOTE — Telephone Encounter (Signed)
Patient called stating that he would like a call back with cpap results and is getting anxious because he continues to fall asleep in the chair. Please assist.

## 2012-10-23 NOTE — Telephone Encounter (Signed)
Spoke with patient, patient requesting results from CPAP download.  Patient would like to know what this download showed and where to go from here because he says he still "falling asleep in the chair" and needs this fixed.  Dr. Shelle Iron, do you have this download and an interpretation for patient? Please advise, thank you  Download paper is in your download folder!

## 2012-10-26 ENCOUNTER — Other Ambulatory Visit: Payer: Self-pay | Admitting: Pulmonary Disease

## 2012-10-26 DIAGNOSIS — G4733 Obstructive sleep apnea (adult) (pediatric): Secondary | ICD-10-CM

## 2012-10-26 NOTE — Telephone Encounter (Signed)
Pt informed

## 2012-10-26 NOTE — Telephone Encounter (Signed)
Have sent order to [facility].

## 2012-10-26 NOTE — Telephone Encounter (Signed)
I have not seen any autotitration results.  He is followed by Dr Shelle Iron and should check with their office since they are managing his CPAP.

## 2012-10-27 NOTE — Telephone Encounter (Signed)
Note Please let pt and dme know that his optimal pressure is 14cm ---- I spoke with patient about results and he verbalized understanding and had no questions.

## 2012-11-13 ENCOUNTER — Ambulatory Visit: Payer: Medicare Other | Admitting: Family Medicine

## 2012-11-16 ENCOUNTER — Encounter: Payer: Self-pay | Admitting: Family Medicine

## 2012-11-16 ENCOUNTER — Ambulatory Visit (INDEPENDENT_AMBULATORY_CARE_PROVIDER_SITE_OTHER): Payer: Medicare Other | Admitting: Family Medicine

## 2012-11-16 VITALS — BP 110/72 | Temp 97.8°F | Wt 239.0 lb

## 2012-11-16 DIAGNOSIS — E785 Hyperlipidemia, unspecified: Secondary | ICD-10-CM

## 2012-11-16 DIAGNOSIS — G4733 Obstructive sleep apnea (adult) (pediatric): Secondary | ICD-10-CM

## 2012-11-16 DIAGNOSIS — R5383 Other fatigue: Secondary | ICD-10-CM

## 2012-11-16 DIAGNOSIS — R5381 Other malaise: Secondary | ICD-10-CM

## 2012-11-16 NOTE — Patient Instructions (Addendum)
Calorie Counting Diet A calorie counting diet requires you to eat the number of calories that are right for you in a day. Calories are the measurement of how much energy you get from the food you eat. Eating the right amount of calories is important for staying at a healthy weight. If you eat too many calories, your body will store them as fat and you may gain weight. If you eat too few calories, you may lose weight. Counting the number of calories you eat during a day will help you know if you are eating the right amount. A Registered Dietitian can determine how many calories you need in a day. The amount of calories needed varies from person to person. If your goal is to lose weight, you will need to eat fewer calories. Losing weight can benefit you if you are overweight or have health problems such as heart disease, high blood pressure, or diabetes. If your goal is to gain weight, you will need to eat more calories. Gaining weight may be necessary if you have a certain health problem that causes your body to need more energy. TIPS Whether you are increasing or decreasing the number of calories you eat during a day, it may be hard to get used to changes in what you eat and drink. The following are tips to help you keep track of the number of calories you eat.  Measure foods at home with measuring cups. This helps you know the amount of food and number of calories you are eating.  Restaurants often serve food in amounts that are larger than 1 serving. While eating out, estimate how many servings of a food you are given. For example, a serving of cooked rice is  cup or about the size of half of a fist. Knowing serving sizes will help you be aware of how much food you are eating at restaurants.  Ask for smaller portion sizes or child-size portions at restaurants.  Plan to eat half of a meal at a restaurant. Take the rest home or share the other half with a friend.  Read the Nutrition Facts panel on  food labels for calorie content and serving size. You can find out how many servings are in a package, the size of a serving, and the number of calories each serving has.  For example, a package might contain 3 cookies. The Nutrition Facts panel on that package says that 1 serving is 1 cookie. Below that, it will say there are 3 servings in the container. The calories section of the Nutrition Facts label says there are 90 calories. This means there are 90 calories in 1 cookie (1 serving). If you eat 1 cookie you have eaten 90 calories. If you eat all 3 cookies, you have eaten 270 calories (3 servings x 90 calories = 270 calories). The list below tells you how big or small some common portion sizes are.  1 oz.........4 stacked dice.  3 oz.........Deck of cards.  1 tsp........Tip of little finger.  1 tbs........Thumb.  2 tbs........Golf ball.   cup.......Half of a fist.  1 cup........A fist. KEEP A FOOD LOG Write down every food item you eat, the amount you eat, and the number of calories in each food you eat during the day. At the end of the day, you can add up the total number of calories you have eaten. It may help to keep a list like the one below. Find out the calorie information by reading the   Nutrition Facts panel on food labels. Breakfast  Bran cereal (1 cup, 110 calories).  Fat-free milk ( cup, 45 calories). Snack  Apple (1 medium, 80 calories). Lunch  Spinach (1 cup, 20 calories).  Tomato ( medium, 20 calories).  Chicken breast strips (3 oz, 165 calories).  Shredded cheddar cheese ( cup, 110 calories).  Light Italian dressing (2 tbs, 60 calories).  Whole-wheat bread (1 slice, 80 calories).  Tub margarine (1 tsp, 35 calories).  Vegetable soup (1 cup, 160 calories). Dinner  Pork chop (3 oz, 190 calories).  Brown rice (1 cup, 215 calories).  Steamed broccoli ( cup, 20 calories).  Strawberries (1  cup, 65 calories).  Whipped cream (1 tbs, 50  calories). Daily Calorie Total: 1425 Document Released: 10/21/2005 Document Revised: 01/13/2012 Document Reviewed: 04/17/2007 [employer] Patient Information 2013 [employer].  

## 2012-11-16 NOTE — Progress Notes (Signed)
  Subjective:    Patient ID: Richard Fernandez, male    DOB: August 18, 1941, 72 y.o.   MRN: [id number]  HPI Patient continues to complain of fatigue and daytime somnolence. Obstructive sleep apnea. Recent assessment per pulmonary. CPAP titrated from 12 to 14. Generally feels he is sleeping okay at night. No recent chest pains. No dyspnea. Depression stable on Celexa and Wellbutrin. He has some daytime somnolence but more than anything daytime fatigue. Recent TSH unremarkable.  B12 normal. He has gained some weight in recent years and very little exercise. No chest pain and no exertional dyspnea.  Past Medical History  Diagnosis Date  . HYPERLIPIDEMIA 11/29/2010  . DEPRESSION 11/29/2010  . OBSTRUCTIVE SLEEP APNEA 11/29/2010  . CEREBROVASCULAR ACCIDENT, HX OF 11/29/2010   Past Surgical History  Procedure Date  . Knee surgery   . Hand surgery   . Shoulder arthroscopy with rotator cuff repair   . Back surgery 1962    reports that he quit smoking about 24 years ago. His smoking use included Cigarettes. He has a 30 pack-year smoking history. He has never used smokeless tobacco. He reports that he does not drink alcohol or use illicit drugs. family history is not on file.  He is adopted. No Known Allergies    Review of Systems  Constitutional: Positive for fatigue. Negative for fever, chills, appetite change and unexpected weight change.  Respiratory: Negative for cough and shortness of breath.   Cardiovascular: Negative for chest pain, palpitations and leg swelling.  Gastrointestinal: Negative for abdominal pain.  Genitourinary: Negative for dysuria.  Neurological: Negative for dizziness.  Psychiatric/Behavioral: Negative for dysphoric mood.       Objective:   Physical Exam  Constitutional: He appears well-developed and well-nourished.  HENT:  Mouth/Throat: Oropharynx is clear and moist.  Neck: Neck supple. No thyromegaly present.  Cardiovascular: Normal rate and regular rhythm.     Pulmonary/Chest: Effort normal and breath sounds normal. No respiratory distress. He has no wheezes. He has no rales.  Psychiatric: He has a normal mood and affect. His behavior is normal. Thought content normal.          Assessment & Plan:  Fatigue. Recent CPAP adjusted. Doubt related to obstructive sleep apnea at this point. Recent lab work unremarkable. We discussed trial of weight loss and establishing more consistent exercise first. Set up nutrition counseling. He plans to start with walking on treadmill as a first step.

## 2012-11-17 ENCOUNTER — Encounter: Payer: Medicare Other | Attending: Family Medicine | Admitting: Dietician

## 2012-11-17 ENCOUNTER — Encounter: Payer: Self-pay | Admitting: Dietician

## 2012-11-17 VITALS — Ht 68.0 in | Wt 234.4 lb

## 2012-11-17 DIAGNOSIS — E663 Overweight: Secondary | ICD-10-CM | POA: Insufficient documentation

## 2012-11-17 DIAGNOSIS — E785 Hyperlipidemia, unspecified: Secondary | ICD-10-CM | POA: Insufficient documentation

## 2012-11-17 DIAGNOSIS — Z713 Dietary counseling and surveillance: Secondary | ICD-10-CM | POA: Insufficient documentation

## 2012-11-17 DIAGNOSIS — G4730 Sleep apnea, unspecified: Secondary | ICD-10-CM | POA: Insufficient documentation

## 2012-11-17 DIAGNOSIS — R634 Abnormal weight loss: Secondary | ICD-10-CM

## 2012-11-17 NOTE — Patient Instructions (Addendum)
   Use canned fruit packed in lite syrup and/or juice, then pour off the juice.  Leave the waffle and pancakes in the store.  Try the Weight Watcher's trick of dipping your fork into the salad dressing.  Try getting the grinder veggies and meat on a plate rather than a bun.  When out and having the tea, get the unsweetened and add your sweet and low.  Keep the pasta/starch/potatoes 2/3-1 cup of starch.  Continue with lean meat, doing poultry and fish more often that the red meats.  Continue with the baking, broiling, roasting, grilling of meats.  For the nut snacks, use the roasted nuts that are not shelled.  Measure your nut servings, aim for 1/4 cup of nuts.   Use the salad plate for serving your meal.  Read your food labels.  Try to keep the sugar content to (0-9 gm) per serving.

## 2012-11-17 NOTE — Progress Notes (Signed)
Medical Nutrition Therapy:  Appt start time: 1530 end time:  1630.   Assessment:  Primary concerns today: MD wants him to lose weight and he needs some help with his diet and meal plans. 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  Given his issues with walking and the use of the brace, he wants to lose some weight for possibly helping with mobility.  Weight loss Shadai Mcclane well help with the daytime somnolence and the ensuing fatigue. Has a history of hyperlipidemia.  Reports that he is about getting back to exercise which will help with increasing his energy level.   On weighing with his brace, his weight is at 234.4 lb on my scales.  He reports that his leg brace weighs 8 lb.  This would make his weight at 226 lb (102.7 kg).    MEDICATIONS: Completed med review   DIETARY INTAKE:  Usual eating pattern includes 3 meals and 1-3 snacks per day.  Everyday foods include likes about any kind of veggies.  Avoided foods include brussels sprouts.    24-hr recall:  B ( AM): 6:00 cup of tea, sweet and low.  7-8:00 eggs poach/scrambled, boiled , fried 2 eggs, and maybe an English muffin or a bagel. OR oatmeal made from oats, 1 cup with butter, milk in there oatmeal and sometimes an english muffin (regular)  OR fruit blend of canned fruit 1 cup with the juice glass of water or cup of tea..  Rarely pancakes or waffles.  Snk ( AM): Sip on water with a cup of tea or coffee about 11:00 AM  L ( PM): 12:00-1:00 salad large lunch salad and maybe french fries and a glass of tea.  Enjoys a grinder.  (subway, club with [place] sausage, pastrami, ham and swiss cheese, lettuce, tomato, banana peppers, black olives. Tea sweetened with sugar, at home use sweet and low. Snk ( PM): cup of tea or cup of coffee. D ( PM): 6-7:00 starch pasta rice, potatoes and veggie Will use one of the many, usually chicken, infrequently beef, and sometimes pork.  Water  or tea, crystal light.   Snk ( PM): sometimes snack Try to not eat past 7:00 Natalya Domzalski have fruit or peanuts. Beverages: tea, coffee, water, crystal light.  Usual physical activity: Usually not an exercise person.  But, starting to walk on a treadmill.  Starting tomorrow at 15 minutes.  Does sometimes walk in his neighborhood with his dog.  Generally, he will plan to use the treadmill.    Estimated energy needs: Ht: 68 in  WT: 234.4 lb with his brace and shoes on.  BMI: 35.7 kg/m2  Adj WT: 181 lb. (83 kg) 1700-1800 calories for weight loss 195-200 g carbohydrates 130-135 g protein 46-49 g fat  Progress Towards Goal(s):  No progress.   Nutritional Diagnosis:  [place]ell-3.3 Overweight/obesity As related to limited physical activity, frequent use of simple sugars in the diet, at times increased servings of starches/grains..  As evidenced by weight of about 226 lb (without the 8 lb brace), and BMI of 34.4.    Intervention:  Nutrition Review of his dietary intake and made a number of recommendations to help with cutting out some of the calories that are slipping into his diet each day.  Recommended that he plan to get back to the gym and back to walking to help with increasing his metabolic rate and assist with weight loss and  increase his energy level.  Goals:  Use canned fruit packed in lite syrup and/or juice, then pour off the juice.  Leave the waffle and pancakes in the store.  Try the Weight Watcher's trick of dipping your fork into the salad dressing.  Try getting the grinder veggies and meat on a plate rather than a bun.  When out and having the tea, get the unsweetened and add your sweet and low.  Keep the pasta/starch/potatoes 2/3-1 cup of starch.  Continue with lean meat, doing poultry and fish more often that the red meats.  Continue with the baking, broiling, roasting, grilling of meats.  For the nut snacks, use the roasted nuts that are not shelled.  Measure your nut servings, aim  for 1/4 cup of nuts.   Use the salad plate for serving your meal.  Read your food labels.  Try to keep the sugar content to (0-9 gm) per serving.    Handouts given during visit include:  Snack list  Weight Loss Suggestions handout  Monitoring/Evaluation:  Dietary intake, exercise, and body weight in 8-12 weeks, he is to call and schedule the appointment.Marland Kitchen

## 2012-12-06 ENCOUNTER — Other Ambulatory Visit: Payer: Self-pay | Admitting: Family Medicine

## 2012-12-21 ENCOUNTER — Other Ambulatory Visit: Payer: Self-pay | Admitting: Family Medicine

## 2013-02-10 ENCOUNTER — Ambulatory Visit: Payer: Medicare Other | Admitting: Family Medicine

## 2013-03-24 ENCOUNTER — Other Ambulatory Visit: Payer: Self-pay | Admitting: Family Medicine

## 2013-03-30 ENCOUNTER — Other Ambulatory Visit: Payer: Self-pay | Admitting: Family Medicine

## 2013-05-03 ENCOUNTER — Other Ambulatory Visit: Payer: Self-pay | Admitting: Family Medicine

## 2013-06-02 ENCOUNTER — Other Ambulatory Visit: Payer: Self-pay | Admitting: Family Medicine

## 2013-07-07 ENCOUNTER — Telehealth: Payer: Self-pay | Admitting: Family Medicine

## 2013-07-07 NOTE — Telephone Encounter (Signed)
Pt informed

## 2013-07-07 NOTE — Telephone Encounter (Signed)
Would like to know if they can stop clopidogrel (PLAVIX) 75 MG tablet    for a small surgery. pls call

## 2013-07-07 NOTE — Telephone Encounter (Signed)
Could hold for 5 days prior to surgery but try to start back ASAP after surgery (as advised by surgeon)

## 2013-07-13 ENCOUNTER — Telehealth: Payer: Self-pay | Admitting: Family Medicine

## 2013-07-13 NOTE — Telephone Encounter (Signed)
Pt following up on on clearance letter that should have been faxed to [facility].that states it is ok for him to go off clopidogrel (PLAVIX) 75 MG tablet for 5 days.    Fax  Attn: Raylene Miyamoto.

## 2013-07-14 NOTE — Telephone Encounter (Signed)
Let pt know this has been signed.

## 2013-07-14 NOTE — Telephone Encounter (Signed)
Faxed paper this morning at 8:18am

## 2013-07-14 NOTE — Telephone Encounter (Signed)
Do you have this form in your folder?

## 2013-07-14 NOTE — Telephone Encounter (Signed)
Has this form been faxed? Pt states Richard Fernandez had not received as of yesterday. Everything is on hold until they get this form. Pt needs asap.

## 2013-09-03 ENCOUNTER — Ambulatory Visit (INDEPENDENT_AMBULATORY_CARE_PROVIDER_SITE_OTHER): Payer: Medicare Other

## 2013-09-03 DIAGNOSIS — Z23 Encounter for immunization: Secondary | ICD-10-CM

## 2013-10-06 ENCOUNTER — Other Ambulatory Visit: Payer: Self-pay | Admitting: Family Medicine

## 2013-10-18 ENCOUNTER — Ambulatory Visit (INDEPENDENT_AMBULATORY_CARE_PROVIDER_SITE_OTHER): Payer: Medicare Other | Admitting: Family Medicine

## 2013-10-18 ENCOUNTER — Encounter: Payer: Self-pay | Admitting: Family Medicine

## 2013-10-18 VITALS — BP 116/64 | HR 95 | Temp 97.9°F | Ht 68.0 in | Wt 250.0 lb

## 2013-10-18 DIAGNOSIS — F3289 Other specified depressive episodes: Secondary | ICD-10-CM

## 2013-10-18 DIAGNOSIS — Z8679 Personal history of other diseases of the circulatory system: Secondary | ICD-10-CM

## 2013-10-18 DIAGNOSIS — Z0000 Encounter for general adult medical examination without abnormal findings: Secondary | ICD-10-CM

## 2013-10-18 DIAGNOSIS — E785 Hyperlipidemia, unspecified: Secondary | ICD-10-CM

## 2013-10-18 DIAGNOSIS — G4733 Obstructive sleep apnea (adult) (pediatric): Secondary | ICD-10-CM

## 2013-10-18 DIAGNOSIS — E669 Obesity, unspecified: Secondary | ICD-10-CM

## 2013-10-18 DIAGNOSIS — F329 Major depressive disorder, single episode, unspecified: Secondary | ICD-10-CM

## 2013-10-18 NOTE — Progress Notes (Signed)
Subjective:    Patient ID: Richard Fernandez, male    DOB: Jul 16, 1941, 72 y.o.   MRN: [id number]  HPI Patient seen for Medicare wellness exam and medical followup He has history of hyperlipidemia, depression, obstructive sleep apnea, remote history of stroke. He also has history of some urinary urgency and sees urologist for that. His urologist is checking his PSAs yearly. meds reviewed and compliant with all.  He has never had colonoscopy and refuses at this time. Symptomatically stable other than the fact he has some sedation which she attributes to Celexa. He does take his Celexa during the day. His mood is stable. He uses CPAP regularly.  Past Medical History  Diagnosis Date  . HYPERLIPIDEMIA 11/29/2010  . DEPRESSION 11/29/2010  . OBSTRUCTIVE SLEEP APNEA 11/29/2010  . CEREBROVASCULAR ACCIDENT, HX OF 11/29/2010   Past Surgical History  Procedure Laterality Date  . Knee surgery    . Hand surgery    . Shoulder arthroscopy with rotator cuff repair    . Back surgery  1962    reports that he quit smoking about 24 years ago. His smoking use included Cigarettes. He has a 30 pack-year smoking history. He has never used smokeless tobacco. He reports that he does not drink alcohol or use illicit drugs. family history is not on file. He was adopted. No Known Allergies  1.  Risk factors based on Past Medical , Social, and Family history reviewed and as above 2.  Limitations in physical activities very little exercise. No recent falls 3.  Depression/mood depression stable 4.  Hearing chronic hearing loss and has hearing aids in followed by audiology 5.  ADLs independent in all 6.  Cognitive function (orientation to time and place, language, writing, speech,memory) memory intact. Judgment and language intact 7.  Home Safety no issues 8.  Height, weight, and visual acuity. Weight is up slightly by our scales but not by his 9.  Counseling discussed importance of weight loss 10. Recommendation of  preventive services. Colonoscopy recommended the patient declines. Flu vaccine already given. 11. Labs based on risk factors lipid, hepatic, basic metabolic panel 12. Care Plan as above    Review of Systems  Constitutional: Negative for fever, activity change, appetite change and unexpected weight change.  HENT: Negative for congestion, ear pain and trouble swallowing.   Eyes: Negative for pain and visual disturbance.  Respiratory: Negative for cough, shortness of breath and wheezing.   Cardiovascular: Negative for chest pain and palpitations.  Gastrointestinal: Negative for nausea, vomiting, abdominal pain, diarrhea, constipation, blood in stool, abdominal distention and rectal pain.  Endocrine: Negative for polydipsia and polyuria.  Genitourinary: Negative for dysuria, hematuria and testicular pain.  Musculoskeletal: Negative for arthralgias and joint swelling.  Skin: Negative for rash.  Neurological: Negative for dizziness, syncope and headaches.  Hematological: Negative for adenopathy.  Psychiatric/Behavioral: Negative for confusion and dysphoric mood.       Objective:   Physical Exam  Constitutional: He is oriented to person, place, and time. He appears well-developed and well-nourished. No distress.  HENT:  Head: Normocephalic and atraumatic.  Right Ear: External ear normal.  Left Ear: External ear normal.  Mouth/Throat: Oropharynx is clear and moist.  Eyes: Conjunctivae and EOM are normal. Pupils are equal, round, and reactive to light.  Neck: Normal range of motion. Neck supple. No thyromegaly present.  Cardiovascular: Normal rate, regular rhythm and normal heart sounds.   No murmur heard. Pulmonary/Chest: No respiratory distress. He has no wheezes. He has no rales.  Abdominal: Soft. Bowel sounds are normal. He exhibits no distension and no mass. There is no tenderness. There is no rebound and no guarding.  Genitourinary:  Deferred per recent exam with urology    Musculoskeletal: He exhibits no edema.  Lymphadenopathy:    He has no cervical adenopathy.  Neurological: He is alert and oriented to person, place, and time. He displays normal reflexes. No cranial nerve deficit.  Skin: No rash noted.  Psychiatric: He has a normal mood and affect.          Assessment & Plan:  #1 physical exam. Labs ordered as above. Colonoscopy declined. Flu vaccine already given. Pneumovax up-to-date. We discussed Prevnar 13 and he declines #2 hyperlipidemia. Labs ordered. He is not fasting today. #3 depression. Stable. Try taking Celexa at night. If no improvement in sedation after 2 weeks try reducing to 20 mg

## 2013-10-18 NOTE — Patient Instructions (Signed)
Try taking your Celexa at night.   If after 2 weeks sedation not improved, try reducing Celexa to one half tablet daily (20 mg).

## 2013-10-18 NOTE — Progress Notes (Signed)
Pre visit review using our clinic review tool, if applicable. No additional management support is needed unless otherwise documented below in the visit note. 

## 2013-10-22 ENCOUNTER — Other Ambulatory Visit: Payer: Medicare Other

## 2013-11-13 ENCOUNTER — Other Ambulatory Visit: Payer: Self-pay | Admitting: Family Medicine

## 2013-11-19 ENCOUNTER — Telehealth: Payer: Self-pay

## 2013-11-19 NOTE — Telephone Encounter (Signed)
Pt stated that his Wellbutrin cost $134 he stated that is to much. He also wants to know why he is on Celexa and Wellbutrin which are depression meds. He wants to know if he can take only the Celexa

## 2013-11-22 NOTE — Telephone Encounter (Signed)
Celexa and Wellbutrin are frequently used to compliment each other as they target different neurotransmitters in the brain.  We had added the Wellbutrin as his depression was not controlled with the Celexa alone.  If he feels his depression is stable, we could look at stopping the Wellbutrin at this time.

## 2013-11-22 NOTE — Telephone Encounter (Signed)
Pt is fine with just taking the Celexa, and pt informed that if he feels that the Celexa is not controlling his depression that he will give the office a call back.

## 2013-12-28 ENCOUNTER — Other Ambulatory Visit: Payer: Self-pay | Admitting: Family Medicine

## 2014-01-03 ENCOUNTER — Other Ambulatory Visit: Payer: Self-pay

## 2014-01-03 MED ORDER — SIMVASTATIN 40 MG PO TABS: ORAL_TABLET | ORAL | Status: DC

## 2014-01-11 ENCOUNTER — Other Ambulatory Visit: Payer: Self-pay | Admitting: Family Medicine

## 2014-01-22 ENCOUNTER — Encounter: Payer: Self-pay | Admitting: *Deleted

## 2014-01-27 ENCOUNTER — Other Ambulatory Visit: Payer: Self-pay | Admitting: Family Medicine

## 2014-03-31 ENCOUNTER — Telehealth: Payer: Self-pay | Admitting: Family Medicine

## 2014-03-31 DIAGNOSIS — M25562 Pain in left knee: Secondary | ICD-10-CM

## 2014-03-31 NOTE — Telephone Encounter (Signed)
Referral is ordered

## 2014-03-31 NOTE — Telephone Encounter (Signed)
Ok to refer.

## 2014-03-31 NOTE — Telephone Encounter (Signed)
Pt states he has hurt his left knee due to the right leg knee brace and the pressure it puts on his leg. Pt would like a referral to [facility] .[phone].  Pt has seen them before. Pt states his gait and balance have been aftected

## 2014-08-24 ENCOUNTER — Other Ambulatory Visit: Payer: Self-pay | Admitting: Family Medicine

## 2014-09-21 ENCOUNTER — Telehealth: Payer: Self-pay | Admitting: Family Medicine

## 2014-09-21 NOTE — Telephone Encounter (Signed)
Pt needs referral to [facility] : [address], [place], [place] [address]  [phone]  Pt has appt 12/1 at 10 am Pt has [employer] and has seen these folks before  Pt having left knee brace made.

## 2014-10-04 NOTE — Telephone Encounter (Signed)
Contacted [employer] and spoke with Joelene Millin who stated pt has a Medicare PPO plan that does not require a referral.  Ref# [id number].  Called and spoke with Gregary Signs at [facility] and informed her pt's plan does not require a referral.  Gregary Signs stated she thinks the patient needs an order instead but that she would check with Gerald Stabs and call back to advise.  Pt was also made aware that his plan does not require a referral for his appt with [facility] today.

## 2014-10-05 ENCOUNTER — Telehealth: Payer: Self-pay | Admitting: Family Medicine

## 2014-10-05 NOTE — Telephone Encounter (Signed)
Pt was seen in the office yesterday and the practitioner is requesting an order from PCP for an ACL Knee Brace.  Please include dx code and fax order to [phone].

## 2014-10-06 NOTE — Telephone Encounter (Signed)
OK 

## 2014-10-06 NOTE — Telephone Encounter (Addendum)
Pt needs order for left knee braces to be made please send to [facility] phone # [phone] fax # [phone]

## 2014-10-07 NOTE — Telephone Encounter (Signed)
Order completed for a left knee brace (ACL) due to left knee pain and faxed to [place] at [phone]. I left a detailed message with this information on the pts cell number.

## 2014-10-31 ENCOUNTER — Telehealth: Payer: Self-pay | Admitting: Pulmonary Disease

## 2014-10-31 NOTE — Telephone Encounter (Signed)
Spoke with the pt He is requesting to switch to Dr Annamaria Boots or Dr Halford Chessman for sleep  [facility], please advise if this is okay thanks!

## 2014-11-03 ENCOUNTER — Ambulatory Visit (INDEPENDENT_AMBULATORY_CARE_PROVIDER_SITE_OTHER): Payer: Medicare HMO | Admitting: Family Medicine

## 2014-11-03 ENCOUNTER — Ambulatory Visit (INDEPENDENT_AMBULATORY_CARE_PROVIDER_SITE_OTHER): Payer: Medicare HMO

## 2014-11-03 ENCOUNTER — Encounter: Payer: Self-pay | Admitting: Family Medicine

## 2014-11-03 VITALS — BP 120/70 | HR 64 | Temp 97.8°F | Wt 246.0 lb

## 2014-11-03 DIAGNOSIS — E785 Hyperlipidemia, unspecified: Secondary | ICD-10-CM

## 2014-11-03 DIAGNOSIS — Z23 Encounter for immunization: Secondary | ICD-10-CM

## 2014-11-03 DIAGNOSIS — G4733 Obstructive sleep apnea (adult) (pediatric): Secondary | ICD-10-CM

## 2014-11-03 DIAGNOSIS — L810 Postinflammatory hyperpigmentation: Secondary | ICD-10-CM

## 2014-11-03 DIAGNOSIS — F329 Major depressive disorder, single episode, unspecified: Secondary | ICD-10-CM

## 2014-11-03 DIAGNOSIS — Z0000 Encounter for general adult medical examination without abnormal findings: Secondary | ICD-10-CM

## 2014-11-03 DIAGNOSIS — Z125 Encounter for screening for malignant neoplasm of prostate: Secondary | ICD-10-CM

## 2014-11-03 DIAGNOSIS — F32A Depression, unspecified: Secondary | ICD-10-CM

## 2014-11-03 LAB — CBC WITH DIFFERENTIAL/PLATELET
BASOS ABS: 0 10*3/uL (ref 0.0–0.1)
Basophils Relative: 0.7 % (ref 0.0–3.0)
EOS PCT: 5.1 % — AB (ref 0.0–5.0)
Eosinophils Absolute: 0.3 10*3/uL (ref 0.0–0.7)
HEMATOCRIT: 40.3 % (ref 39.0–52.0)
Hemoglobin: 14.1 g/dL (ref 13.0–17.0)
LYMPHS ABS: 1.6 10*3/uL (ref 0.7–4.0)
Lymphocytes Relative: 31 % (ref 12.0–46.0)
MCHC: 34.9 g/dL (ref 30.0–36.0)
MCV: 101.4 fl — ABNORMAL HIGH (ref 78.0–100.0)
MONOS PCT: 9.7 % (ref 3.0–12.0)
Monocytes Absolute: 0.5 10*3/uL (ref 0.1–1.0)
Neutro Abs: 2.8 10*3/uL (ref 1.4–7.7)
Neutrophils Relative %: 53.5 % (ref 43.0–77.0)
Platelets: 229 10*3/uL (ref 150.0–400.0)
RBC: 3.97 Mil/uL — ABNORMAL LOW (ref 4.22–5.81)
RDW: 14.3 % (ref 11.5–15.5)
WBC: 5.3 10*3/uL (ref 4.0–10.5)

## 2014-11-03 LAB — COMPREHENSIVE METABOLIC PANEL
ALBUMIN: 3.8 g/dL (ref 3.5–5.2)
ALT: 26 U/L (ref 0–53)
AST: 22 U/L (ref 0–37)
Alkaline Phosphatase: 57 U/L (ref 39–117)
BUN: 15 mg/dL (ref 6–23)
CALCIUM: 9.1 mg/dL (ref 8.4–10.5)
CHLORIDE: 101 meq/L (ref 96–112)
CO2: 29 mEq/L (ref 19–32)
Creatinine, Ser: 0.7 mg/dL (ref 0.4–1.5)
GFR: 111.76 mL/min (ref >60.00)
Glucose, Bld: 105 mg/dL — ABNORMAL HIGH (ref 70–99)
Potassium: 4.2 mEq/L (ref 3.5–5.1)
Sodium: 136 mEq/L (ref 135–145)
Total Bilirubin: 0.5 mg/dL (ref 0.2–1.2)
Total Protein: 7.1 g/dL (ref 6.0–8.3)

## 2014-11-03 LAB — LIPID PANEL
CHOL/HDL RATIO: 4
Cholesterol: 206 mg/dL — ABNORMAL HIGH (ref 0–200)
HDL: 53 mg/dL (ref >39.00)
LDL Cholesterol: 136 mg/dL — ABNORMAL HIGH (ref 0–99)
NONHDL: 153
Triglycerides: 83 mg/dL (ref 0.0–149.0)
VLDL: 16.6 mg/dL (ref 0.0–40.0)

## 2014-11-03 LAB — PSA: PSA: 1.36 ng/mL (ref 0.10–4.00)

## 2014-11-03 LAB — TSH: TSH: 2.39 u[IU]/mL (ref 0.35–4.50)

## 2014-11-03 NOTE — Progress Notes (Signed)
Pre visit review using our clinic review tool, if applicable. No additional management support is needed unless otherwise documented below in the visit note. 

## 2014-11-03 NOTE — Progress Notes (Signed)
   Subjective:    Patient ID: Richard Fernandez, male    DOB: Dec 17, 1940, 73 y.o.   MRN: [id number]  HPI Patient is seen for medical follow-up. He has medical problems including history of obesity, past history of stroke, dyslipidemia, history of chronic recurrent depression, obstructive sleep apnea, urinary urgency. Medications reviewed. Compliant with all. He is overdue for lab work. He needs flu vaccine and also Prevnar 13 and is willing to get both of these today. He is followed by pulmonary regarding his obstructive sleep apnea.  Approximate 1 year ago he had a fall and had bruising left lower leg. Saw orthopedist and x-rays reportedly negative. He still has some pigmentary change medial aspect left leg. No major pain with ambulation. Denies any recent falls.  His depression is stable on combination therapy with Wellbutrin and Celexa.  Past Medical History  Diagnosis Date  . HYPERLIPIDEMIA 11/29/2010  . DEPRESSION 11/29/2010  . OBSTRUCTIVE SLEEP APNEA 11/29/2010  . CEREBROVASCULAR ACCIDENT, HX OF 11/29/2010   Past Surgical History  Procedure Laterality Date  . Knee surgery    . Hand surgery    . Shoulder arthroscopy with rotator cuff repair    . Back surgery  1962    reports that he quit smoking about 26 years ago. His smoking use included Cigarettes. He has a 30 pack-year smoking history. He has never used smokeless tobacco. He reports that he does not drink alcohol or use illicit drugs. family history is not on file. He was adopted. No Known Allergies    Review of Systems  Constitutional: Negative for fatigue.  Eyes: Negative for visual disturbance.  Respiratory: Negative for cough, chest tightness and shortness of breath.   Cardiovascular: Negative for chest pain and palpitations.  Endocrine: Negative for polydipsia and polyuria.  Neurological: Negative for dizziness, syncope, weakness, light-headedness and headaches.       Objective:   Physical Exam  Constitutional: He  appears well-developed and well-nourished.  Cardiovascular: Normal rate and regular rhythm.   Pulmonary/Chest: Effort normal and breath sounds normal. No respiratory distress. He has no wheezes. He has no rales.  Musculoskeletal:  Left leg reveals no pitting edema. He has area approximate 6 x 6 cm of very faint nonblanching hyperpigmentation. No ulceration. No hematoma          Assessment & Plan:  #1 history of recurrent depression stable on 2 medications as above. Continue current medications #2 hyperlipidemia. Past history of CVA. Recheck lipid and hepatic panel #3 hyperpigmentation changes from old injury. No concerning features otherwise. Reassurance #4 health maintenance. Flu vaccine and Prevnar 13 given #5 history of obstructive sleep apnea. Continue follow-up with pulmonary

## 2014-11-04 NOTE — Telephone Encounter (Signed)
Haven't seen in 2 yrs. [place] with me.

## 2014-11-07 NOTE — Telephone Encounter (Signed)
Spoke with pt - Sleep Consult scheduled for Dec 20, 2014 at 9:30 am with Dr. Annamaria Boots.  Pt confirmed appt, was very appreciative of this, and voiced no further questions or concerns at this time.

## 2014-11-07 NOTE — Telephone Encounter (Signed)
Dr young, please advise if ok to accept pt as sleep pt.

## 2014-11-07 NOTE — Telephone Encounter (Signed)
Ok for a routine new when slot available

## 2014-11-10 ENCOUNTER — Telehealth: Payer: Self-pay | Admitting: Family Medicine

## 2014-11-10 MED ORDER — AMOXICILLIN 875 MG PO TABS: 875.0000 mg | ORAL_TABLET | Freq: Two times a day (BID) | ORAL | Status: DC

## 2014-11-10 NOTE — Telephone Encounter (Signed)
Pt needs new rx for  Right caliper replacement in shoes fax to [facility] [phone]. Pt dx foot drop . Pt shoe broke

## 2014-11-10 NOTE — Telephone Encounter (Signed)
Pt was seen on 12-31 and was told try OTC for sinuses and no relief. Pt new pharm [facility] [address]. Please advise

## 2014-11-10 NOTE — Telephone Encounter (Signed)
RX sent to pharmacy. Pt is aware. Faxed Rx to [facility].

## 2014-11-10 NOTE — Telephone Encounter (Signed)
OK to write for replacement piece.  If no allergy, let's start Amoxicillin 875 mg po bid for 10 days.

## 2014-11-23 ENCOUNTER — Other Ambulatory Visit: Payer: Self-pay | Admitting: *Deleted

## 2014-11-23 MED ORDER — SIMVASTATIN 40 MG PO TABS: ORAL_TABLET | ORAL | Status: DC

## 2014-12-20 ENCOUNTER — Telehealth: Payer: Self-pay | Admitting: Internal Medicine

## 2014-12-20 ENCOUNTER — Institutional Professional Consult (permissible substitution): Payer: Medicare HMO | Admitting: Internal Medicine

## 2014-12-20 NOTE — Telephone Encounter (Signed)
Spoke with pt. Advised him that it would be 2 months before we could get him in with [facility]. [facility] has openings this week and first week of March. I offered these appointments to pt and he stated that he would talk with his wife and get back with [place]. Will await call back.

## 2014-12-22 NOTE — Telephone Encounter (Signed)
Spoke with pt .  He states that he does not want to schedule with Dr Gwenette Greet at this time.  He is going to try to figure something out and will call us back if he would like to reschedule with Dr young or Dr Gwenette Greet.

## 2014-12-22 NOTE — Telephone Encounter (Signed)
lmtcb x1 

## 2014-12-22 NOTE — Telephone Encounter (Signed)
Pt cb, please call at previous number listed

## 2014-12-23 ENCOUNTER — Telehealth: Payer: Self-pay | Admitting: Family Medicine

## 2014-12-23 NOTE — Telephone Encounter (Signed)
Patient would like a referral to [facility] to see Dr. Asencion Partridge Dohmeier due to him having a stroke. He has [employer]. Address: [address], [place], [place] [address]  [phone]

## 2014-12-25 ENCOUNTER — Telehealth: Payer: Self-pay | Admitting: Family Medicine

## 2014-12-25 NOTE — Telephone Encounter (Signed)
OK to refer.

## 2014-12-26 ENCOUNTER — Other Ambulatory Visit: Payer: Self-pay | Admitting: Family Medicine

## 2014-12-26 DIAGNOSIS — I639 Cerebral infarction, unspecified: Secondary | ICD-10-CM

## 2014-12-26 MED ORDER — CITALOPRAM HYDROBROMIDE 40 MG PO TABS: 40.0000 mg | ORAL_TABLET | Freq: Every day | ORAL | Status: DC

## 2014-12-26 NOTE — Telephone Encounter (Signed)
Patient is requesting the below re-fill to go to [facility] [address] [place], [place]  not [facility].   He said he is completely out of medication.  citalopram (CELEXA) 40 MG tablet

## 2014-12-26 NOTE — Telephone Encounter (Signed)
Referral is ordered

## 2014-12-26 NOTE — Telephone Encounter (Signed)
Rx sent to pharmacy   

## 2014-12-28 ENCOUNTER — Telehealth: Payer: Self-pay | Admitting: Family Medicine

## 2014-12-28 MED ORDER — AMOXICILLIN 875 MG PO TABS: 875.0000 mg | ORAL_TABLET | Freq: Two times a day (BID) | ORAL | Status: DC

## 2014-12-28 NOTE — Telephone Encounter (Signed)
Pt states his sinus issue has come back again, just had one in Jan.  Pt states he got better on the amoxi, but has reoccurred. Would like to ask if you will refill before he makes an appt.  Also, pt states you had mentioned increasing his citalopram (CELEXA) 40 MG tablet, pt thinks that might not be a bad idea. Pt just picked up rx on monday, but can cut in half if ok. pls advise.

## 2014-12-28 NOTE — Telephone Encounter (Signed)
Pt states the Celexa is not working anymore is there any thing else he can take.

## 2014-12-28 NOTE — Telephone Encounter (Signed)
May refill Amoxicillin once.  Please clarify- Increase or DECREASE Celexa.  He is on 40 mg which is top dose so cant increase.  If he wants to try reducing to 20 mg can cut in half.

## 2014-12-29 ENCOUNTER — Encounter: Payer: Self-pay | Admitting: Neurology

## 2014-12-29 ENCOUNTER — Ambulatory Visit (INDEPENDENT_AMBULATORY_CARE_PROVIDER_SITE_OTHER): Payer: Medicare HMO | Admitting: Neurology

## 2014-12-29 VITALS — BP 118/61 | HR 78 | Resp 16 | Ht 69.0 in | Wt 242.0 lb

## 2014-12-29 DIAGNOSIS — G4733 Obstructive sleep apnea (adult) (pediatric): Secondary | ICD-10-CM

## 2014-12-29 DIAGNOSIS — E669 Obesity, unspecified: Secondary | ICD-10-CM

## 2014-12-29 DIAGNOSIS — Z9989 Dependence on other enabling machines and devices: Secondary | ICD-10-CM

## 2014-12-29 DIAGNOSIS — R2689 Other abnormalities of gait and mobility: Secondary | ICD-10-CM

## 2014-12-29 DIAGNOSIS — I6302 Cerebral infarction due to thrombosis of basilar artery: Secondary | ICD-10-CM

## 2014-12-29 MED ORDER — FLUOXETINE HCL 20 MG PO CAPS: 20.0000 mg | ORAL_CAPSULE | Freq: Every day | ORAL | Status: DC

## 2014-12-29 NOTE — Telephone Encounter (Signed)
Rx sent to pharmacy and pt is aware. Appt set.

## 2014-12-29 NOTE — Telephone Encounter (Signed)
Lets try stopping the Celexa and starting Fluoxetine (Prozac) 20 mg once daily and office follow up in 4 weeks .
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
## 2014-12-29 NOTE — Telephone Encounter (Signed)
Left message for patient to return call.

## 2015-01-26 ENCOUNTER — Encounter: Payer: Self-pay | Admitting: Family Medicine

## 2015-01-26 ENCOUNTER — Ambulatory Visit (INDEPENDENT_AMBULATORY_CARE_PROVIDER_SITE_OTHER): Payer: Medicare HMO | Admitting: Family Medicine

## 2015-01-26 VITALS — BP 120/70 | HR 62 | Temp 98.0°F | Wt 244.0 lb

## 2015-01-26 DIAGNOSIS — F411 Generalized anxiety disorder: Secondary | ICD-10-CM

## 2015-01-26 MED ORDER — FLUOXETINE HCL 10 MG PO CAPS: 10.0000 mg | ORAL_CAPSULE | Freq: Every day | ORAL | Status: DC

## 2015-01-26 NOTE — Progress Notes (Signed)
   Subjective:    Patient ID: Richard Fernandez, male    DOB: 1941-09-23, 74 y.o.   MRN: [id number]  HPI Patient here for medical follow-up. Has history depression and some chronic anxiety. He feels his depression is stable but he has some daily pervasive anxiety symptoms. He has improved on Prozac but would like to consider possible titration of dosage. No side effects from current dose. He has history of obstructive apnea and recently saw neurology regarding that. He has follow-up sleep study pending. Hyperlipidemia on simvastatin with recent lipids at goal.  Past Medical History  Diagnosis Date  . HYPERLIPIDEMIA 11/29/2010  . DEPRESSION 11/29/2010  . OBSTRUCTIVE SLEEP APNEA 11/29/2010  . CEREBROVASCULAR ACCIDENT, HX OF 11/29/2010  . Stroke   . Hearing loss    Past Surgical History  Procedure Laterality Date  . Knee surgery    . Hand surgery    . Shoulder arthroscopy with rotator cuff repair    . Back surgery  1962    reports that he quit smoking about 26 years ago. His smoking use included Cigarettes. He has a 30 pack-year smoking history. He has never used smokeless tobacco. He reports that he does not drink alcohol or use illicit drugs. family history includes Breast cancer in his mother; Heart failure in his father. He was adopted. No Known Allergies    Review of Systems  Constitutional: Negative for appetite change and unexpected weight change.  Respiratory: Negative for cough and shortness of breath.   Cardiovascular: Negative for chest pain.  Psychiatric/Behavioral: Negative for suicidal ideas, dysphoric mood and agitation. The patient is nervous/anxious.        Objective:   Physical Exam  Constitutional: He appears well-developed and well-nourished. No distress.  Cardiovascular: Normal rate and regular rhythm.   Pulmonary/Chest: Effort normal and breath sounds normal. No respiratory distress. He has no wheezes. He has no rales.  Psychiatric: He has a normal mood and affect.  His behavior is normal. Judgment and thought content normal.          Assessment & Plan:  Anxiety and depression. Depression currently stable. Patient requesting titration of Prozac for anxiety symptoms. Increased to 30 mg daily. Touch base 2-3 weeks if no improvement.

## 2015-01-26 NOTE — Patient Instructions (Signed)
Let's go ahead and add the Prozac 10 once daily (in addition to the 20 mg) Give me some feedback in 2-3 weeks if no better.

## 2015-01-26 NOTE — Progress Notes (Signed)
Pre visit review using our clinic review tool, if applicable. No additional management support is needed unless otherwise documented below in the visit note. 

## 2015-02-17 ENCOUNTER — Other Ambulatory Visit: Payer: Self-pay | Admitting: Family Medicine

## 2015-02-21 ENCOUNTER — Ambulatory Visit (INDEPENDENT_AMBULATORY_CARE_PROVIDER_SITE_OTHER): Payer: Medicare HMO | Admitting: Neurology

## 2015-02-21 VITALS — BP 125/65

## 2015-02-21 DIAGNOSIS — E669 Obesity, unspecified: Secondary | ICD-10-CM

## 2015-02-21 DIAGNOSIS — I6302 Cerebral infarction due to thrombosis of basilar artery: Secondary | ICD-10-CM

## 2015-02-21 DIAGNOSIS — G4733 Obstructive sleep apnea (adult) (pediatric): Secondary | ICD-10-CM

## 2015-02-21 DIAGNOSIS — R2689 Other abnormalities of gait and mobility: Secondary | ICD-10-CM

## 2015-02-21 DIAGNOSIS — Z9989 Dependence on other enabling machines and devices: Secondary | ICD-10-CM

## 2015-02-21 DIAGNOSIS — IMO0001 Reserved for inherently not codable concepts without codable children: Secondary | ICD-10-CM

## 2015-02-21 NOTE — Sleep Study (Signed)
Please see the scanned sleep study interpretation located in the Procedure tab within the Chart Review section. 

## 2015-02-24 ENCOUNTER — Other Ambulatory Visit: Payer: Self-pay | Admitting: Family Medicine

## 2015-03-03 ENCOUNTER — Other Ambulatory Visit: Payer: Self-pay | Admitting: Family Medicine

## 2015-03-09 ENCOUNTER — Other Ambulatory Visit: Payer: Self-pay

## 2015-03-09 ENCOUNTER — Telehealth: Payer: Self-pay

## 2015-03-09 DIAGNOSIS — G4733 Obstructive sleep apnea (adult) (pediatric): Secondary | ICD-10-CM

## 2015-03-09 NOTE — Telephone Encounter (Signed)
Called pt with results of sleep study. Informed him that study showed he had severe osa and that Dr. Brett Fairy recommended restarting cpap and expanding the window of the auto titrator from 7-20 cm H2O. Pt stated to contact [facility] with new orders. Encouraged pt to call back with any questions or concerns.

## 2015-03-23 ENCOUNTER — Telehealth: Payer: Self-pay

## 2015-03-23 NOTE — Telephone Encounter (Signed)
Called pt re: his auto cpap study. Michela Pitcher it was not over yet. Encouraged pt to call us if he hasn't heard from [place] two weeks after the study is finished so we can schedule his f/u. Pt verbalized understanding.

## 2015-04-01 ENCOUNTER — Other Ambulatory Visit: Payer: Self-pay | Admitting: Family Medicine

## 2015-04-07 NOTE — Telephone Encounter (Addendum)
Patient called inquiring about his sleep study results. Please call and advise. Patient can be reached at [phone].

## 2015-04-10 ENCOUNTER — Telehealth: Payer: Self-pay

## 2015-04-10 NOTE — Telephone Encounter (Signed)
Returned pt's phone call. He is asking to schedule a follow up after his "test" is finished at [facility]. He says [facility] was doing a test on his cpap with regards to his settings. Will contact [facility] to find out those results. Made an appt with him 7/11 at 8:30. Pt wanted sooner appt but no available slots. Will call pt if a sooner appt comes available.

## 2015-05-10 ENCOUNTER — Telehealth: Payer: Self-pay

## 2015-05-10 NOTE — Telephone Encounter (Signed)
Called pt to remind him to bring his cpap for his appointment on Monday. Pt verbalized understanding.

## 2015-05-15 ENCOUNTER — Encounter: Payer: Self-pay | Admitting: Neurology

## 2015-05-15 ENCOUNTER — Ambulatory Visit (INDEPENDENT_AMBULATORY_CARE_PROVIDER_SITE_OTHER): Payer: Medicare HMO | Admitting: Neurology

## 2015-05-15 VITALS — BP 110/60 | HR 76 | Resp 20 | Ht 68.0 in | Wt 236.5 lb

## 2015-05-15 DIAGNOSIS — G4733 Obstructive sleep apnea (adult) (pediatric): Secondary | ICD-10-CM

## 2015-05-15 DIAGNOSIS — Z9989 Dependence on other enabling machines and devices: Principal | ICD-10-CM
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
## 2015-05-22 ENCOUNTER — Other Ambulatory Visit: Payer: Self-pay | Admitting: Family Medicine

## 2015-05-22 ENCOUNTER — Other Ambulatory Visit: Payer: Self-pay

## 2015-05-22 DIAGNOSIS — G4733 Obstructive sleep apnea (adult) (pediatric): Secondary | ICD-10-CM

## 2015-05-22 NOTE — Progress Notes (Signed)
[facility] is asking for an auto bipap titration order. Dr. Brett Fairy approved.

## 2015-06-06 ENCOUNTER — Telehealth: Payer: Self-pay | Admitting: Neurology

## 2015-06-06 NOTE — Telephone Encounter (Signed)
Patient called stating the settings on his CPAP machine are great and does not want them changed. Please call and advise. Patient can be reached at [phone].

## 2015-06-06 NOTE — Telephone Encounter (Signed)
Spoke to pt. He said the auto bipap titration worked great and he likes it. He says the titration study ends on 8/8 and he wants to know how is he going to get the new machine since he has to give the auto bipap back to [place]? And is he going to have to go back to his old clunker cpap? I advised pt that when we get the results of his auto bipap titration, dr. Brett Fairy will look at it and write a prescription to get a new bipap machine, if his results warrant it. Until then, he is to continue his cpap. Pt verbalized understanding.

## 2015-06-06 NOTE — Telephone Encounter (Signed)
Pt just missed your call, he is returning the call

## 2015-07-13 ENCOUNTER — Telehealth: Payer: Self-pay | Admitting: Neurology

## 2015-07-13 DIAGNOSIS — G4731 Primary central sleep apnea: Secondary | ICD-10-CM

## 2015-07-13 NOTE — Telephone Encounter (Signed)
Pt called and has questions for the Dr.  Abbott Pao had sleep test done and has not heard results about it. He also has some other concerns and would rather speak with someone. Please call and advise [phone]

## 2015-07-13 NOTE — Telephone Encounter (Signed)
Please contact [employer] [facility] respiratory therapist at [facility] his durable medical equipment company and request another download from his current BiPAP auto. If his AHI is less than 6 his insomnia and daytime hypersomnia is no longer related to sleep apnea since it would be considered sufficiently treated. This is what Mr. Richard Fernandez is waiting for to see if the differential diagnosis of causes of hypersomnia Has to be expanded. I will forward this to [place] since my nurse is currently on vacation - C Nikyah Lackman

## 2015-07-17 NOTE — Telephone Encounter (Signed)
Spoke to pt. I advised him that per Dr. Brett Fairy, his 3 week auto titration on bipap looked good, and she wants to start him on bipap with 16/20 cm H2O with 4 cm spread.   Pt is agreeable to this. I advised him that I would send an order to [facility] and he is to call us and let us know how it works. Pt verbalized understanding.

## 2015-07-19 ENCOUNTER — Telehealth: Payer: Self-pay

## 2015-07-19 NOTE — Telephone Encounter (Signed)
Called [facility] and confirmed with them that they are able to change pt's bipap to a vpap with ipap of 20 and epap of 16. They confirmed that they are able to make these changes.

## 2015-08-25 ENCOUNTER — Encounter: Payer: Self-pay | Admitting: Adult Health

## 2015-08-25 ENCOUNTER — Ambulatory Visit (INDEPENDENT_AMBULATORY_CARE_PROVIDER_SITE_OTHER): Payer: Medicare HMO | Admitting: Adult Health

## 2015-08-25 VITALS — BP 102/60 | HR 70 | Temp 98.1°F | Ht 68.0 in | Wt 240.8 lb

## 2015-08-25 DIAGNOSIS — J209 Acute bronchitis, unspecified: Secondary | ICD-10-CM

## 2015-08-25 MED ORDER — PREDNISONE 20 MG PO TABS: 20.0000 mg | ORAL_TABLET | Freq: Every day | ORAL | Status: DC

## 2015-08-25 NOTE — Progress Notes (Signed)
Subjective:    Patient ID: Richard Fernandez, male    DOB: [birth year], 74 y.o.   MRN: [id number]  HPI  74 year old male, patient of Dr. Elease Hashimoto. He presents to the office today for 4 days of occasionally productive cough, chest congestion and hoarseness.   He has only been using Mucinex, which he endorses helps with the cough and chest congestion.   Review of Systems  Constitutional: Negative.   HENT: Negative.   Respiratory: Positive for cough. Negative for choking, chest tightness, shortness of breath and wheezing.   Cardiovascular: Negative.   Musculoskeletal:       Uses a service dog  Skin: Negative.   Neurological: Negative.   All other systems reviewed and are negative.  Past Medical History  Diagnosis Date  . HYPERLIPIDEMIA 11/29/2010  . DEPRESSION 11/29/2010  . OBSTRUCTIVE SLEEP APNEA 11/29/2010  . CEREBROVASCULAR ACCIDENT, HX OF 11/29/2010  . Stroke ([place])   . Hearing loss     Social History   Social History  . Marital Status: Married    Spouse Name: [name]   . Number of Children: 1  . Years of Education: college   Occupational History  . Not on file.   Social History Main Topics  . Smoking status: Former Smoker -- 1.50 packs/day for 20 years    Types: Cigarettes    Quit date: 11/04/1988  . Smokeless tobacco: Never Used  . Alcohol Use: No  . Drug Use: No  . Sexual Activity: Not on file   Other Topics Concern  . Not on file   Social History Narrative   Pts is adopted. Patient lives at home with his [name].   Retired but still working some.   Education some college.   Left handed.   Caffeine four cups of coffee daily.          Past Surgical History  Procedure Laterality Date  . Knee surgery    . Hand surgery    . Shoulder arthroscopy with rotator cuff repair    . Back surgery  1962    Family History  Problem Relation Age of Onset  . Adopted: Yes  . Breast cancer Mother   . Heart failure Father     No Known Allergies  Current Outpatient  Prescriptions on File Prior to Visit  Medication Sig Dispense Refill  . calcium-vitamin D (OSCAL WITH D) 500-200 MG-UNIT per tablet Take 1 tablet by mouth daily.      . clopidogrel (PLAVIX) 75 MG tablet TAKE ONE TABLET BY MOUTH ONCE DAILY 90 tablet 1  . fish oil-omega-3 fatty acids 1000 MG capsule Take 2 g by mouth daily.    Marland Kitchen FLUoxetine (PROZAC) 20 MG capsule TAKE ONE CAPSULE BY MOUTH ONCE DAILY 30 capsule 5  . Multiple Vitamin (MULTI VITAMIN MENS PO) Take by mouth.    . simvastatin (ZOCOR) 40 MG tablet TAKE ONE TABLET BY MOUTH ONCE DAILY 90 tablet 2   No current facility-administered medications on file prior to visit.    BP 102/60 mmHg  Pulse 70  Temp(Src) 98.1 F (36.7 C) (Oral)  Ht 5\' 8"  (1.727 m)  Wt 240 lb 12.8 oz (109.226 kg)  BMI 36.62 kg/m2  SpO2 95%       Objective:   Physical Exam  Constitutional: He is oriented to person, place, and time. He appears well-developed and well-nourished. No distress.  Cardiovascular: Normal rate, regular rhythm, normal heart sounds and intact distal pulses.  Exam reveals no gallop and  no friction rub.   No murmur heard. Pulmonary/Chest: Effort normal and breath sounds normal. No respiratory distress. He has no wheezes. He has no rales. He exhibits no tenderness.  Extended expiratory phase  Musculoskeletal: Normal range of motion. He exhibits no edema or tenderness.  Lymphadenopathy:    He has no cervical adenopathy.  Neurological: He is alert and oriented to person, place, and time.  Skin: Skin is warm and dry. No rash noted. He is not diaphoretic. No erythema. No pallor.  Psychiatric: He has a normal mood and affect. His behavior is normal. Judgment and thought content normal.  Nursing note and vitals reviewed.      Assessment & Plan:  1. Acute bronchitis, unspecified organism - predniSONE (DELTASONE) 20 MG tablet; Take 1 tablet (20 mg total) by mouth daily with breakfast.  Dispense: 9 tablet; Refill: 0 40mg  x3 day, 20 mg x 3  days - Continue with Mucinex.  - Did not want any cough syrup at this time.  - Follow up if no improvement in the next 2-3 days.

## 2015-08-25 NOTE — Patient Instructions (Addendum)
It was great meeting you today!  Your exam is consistent with Bronchitis. Continue with the Mucinex. Take the prednisone as directed  Day 1 40 mg Day 2 40 mg Day 3 40 mg Day 4 20 mg Day 5 20 mg Day 6 20 mg  If you do not notice any improvement over the weekend, please let me know.   Acute Bronchitis Bronchitis is inflammation of the airways that extend from the windpipe into the lungs (bronchi). The inflammation often causes mucus to develop. This leads to a cough, which is the most common symptom of bronchitis.  In acute bronchitis, the condition usually develops suddenly and goes away over time, usually in a couple weeks. Smoking, allergies, and asthma can make bronchitis worse. Repeated episodes of bronchitis may cause further lung problems.  CAUSES Acute bronchitis is most often caused by the same virus that causes a cold. The virus can spread from person to person (contagious) through coughing, sneezing, and touching contaminated objects. SIGNS AND SYMPTOMS   Cough.   Fever.   Coughing up mucus.   Body aches.   Chest congestion.   Chills.   Shortness of breath.   Sore throat.  DIAGNOSIS  Acute bronchitis is usually diagnosed through a physical exam. Your health care provider will also ask you questions about your medical history. Tests, such as chest X-rays, are sometimes done to rule out other conditions.  TREATMENT  Acute bronchitis usually goes away in a couple weeks. Oftentimes, no medical treatment is necessary. Medicines are sometimes given for relief of fever or cough. Antibiotic medicines are usually not needed but may be prescribed in certain situations. In some cases, an inhaler may be recommended to help reduce shortness of breath and control the cough. A cool mist vaporizer may also be used to help thin bronchial secretions and make it easier to clear the chest.  HOME CARE INSTRUCTIONS  Get plenty of rest.   Drink enough fluids to keep your urine  clear or pale yellow (unless you have a medical condition that requires fluid restriction). Increasing fluids may help thin your respiratory secretions (sputum) and reduce chest congestion, and it will prevent dehydration.   Take medicines only as directed by your health care provider.  If you were prescribed an antibiotic medicine, finish it all even if you start to feel better.  Avoid smoking and secondhand smoke. Exposure to cigarette smoke or irritating chemicals will make bronchitis worse. If you are a smoker, consider using nicotine gum or skin patches to help control withdrawal symptoms. Quitting smoking will help your lungs heal faster.   Reduce the chances of another bout of acute bronchitis by washing your hands frequently, avoiding people with cold symptoms, and trying not to touch your hands to your mouth, nose, or eyes.   Keep all follow-up visits as directed by your health care provider.  SEEK MEDICAL CARE IF: Your symptoms do not improve after 1 week of treatment.  SEEK IMMEDIATE MEDICAL CARE IF:  You develop an increased fever or chills.   You have chest pain.   You have severe shortness of breath.  You have bloody sputum.   You develop dehydration.  You faint or repeatedly feel like you are going to pass out.  You develop repeated vomiting.  You develop a severe headache. MAKE SURE YOU:   Understand these instructions.  Will watch your condition.  Will get help right away if you are not doing well or get worse.   This information  is not intended to replace advice given to you by your health care provider. Make sure you discuss any questions you have with your health care provider.   Document Released: 11/28/2004 Document Revised: 11/11/2014 Document Reviewed: 04/13/2013 Elsevier Interactive Patient Education [employer].

## 2015-08-25 NOTE — Addendum Note (Signed)
Addended by: Colleen Can on: 08/25/2015 11:51 AM   Modules accepted: Orders

## 2015-09-27 ENCOUNTER — Other Ambulatory Visit: Payer: Self-pay | Admitting: Family Medicine

## 2015-11-15 ENCOUNTER — Ambulatory Visit: Payer: Medicare HMO | Admitting: Neurology

## 2015-11-20 DIAGNOSIS — H903 Sensorineural hearing loss, bilateral: Secondary | ICD-10-CM | POA: Diagnosis not present

## 2015-11-27 ENCOUNTER — Other Ambulatory Visit: Payer: Self-pay | Admitting: Family Medicine

## 2015-12-08 ENCOUNTER — Other Ambulatory Visit: Payer: Self-pay | Admitting: Family Medicine

## 2015-12-14 ENCOUNTER — Other Ambulatory Visit: Payer: Self-pay | Admitting: Family Medicine

## 2015-12-14 MED ORDER — SIMVASTATIN 40 MG PO TABS: 40.0000 mg | ORAL_TABLET | Freq: Every day | ORAL | Status: DC

## 2015-12-14 MED ORDER — CLOPIDOGREL BISULFATE 75 MG PO TABS: 75.0000 mg | ORAL_TABLET | Freq: Every day | ORAL | Status: DC

## 2015-12-14 MED ORDER — FLUOXETINE HCL 20 MG PO CAPS
20.0000 mg | ORAL_CAPSULE | Freq: Every day | ORAL | Status: DC
Start: 2015-12-14 — End: 2016-10-07

## 2015-12-27 ENCOUNTER — Encounter: Payer: Self-pay | Admitting: Neurology

## 2015-12-27 ENCOUNTER — Ambulatory Visit (INDEPENDENT_AMBULATORY_CARE_PROVIDER_SITE_OTHER): Payer: Commercial Managed Care - HMO | Admitting: Neurology

## 2015-12-27 VITALS — BP 132/82 | HR 74 | Resp 20 | Ht 68.0 in | Wt 237.0 lb

## 2015-12-27 DIAGNOSIS — R413 Other amnesia: Secondary | ICD-10-CM | POA: Diagnosis not present

## 2015-12-27 DIAGNOSIS — G4733 Obstructive sleep apnea (adult) (pediatric): Secondary | ICD-10-CM | POA: Diagnosis not present

## 2015-12-27 DIAGNOSIS — Z9989 Dependence on other enabling machines and devices: Principal | ICD-10-CM
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
## 2016-01-03 ENCOUNTER — Ambulatory Visit (INDEPENDENT_AMBULATORY_CARE_PROVIDER_SITE_OTHER): Payer: Commercial Managed Care - HMO | Admitting: Family Medicine

## 2016-01-03 ENCOUNTER — Encounter: Payer: Self-pay | Admitting: Family Medicine

## 2016-01-03 VITALS — BP 102/64 | HR 63 | Temp 97.4°F | Wt 239.4 lb

## 2016-01-03 DIAGNOSIS — Z0000 Encounter for general adult medical examination without abnormal findings: Secondary | ICD-10-CM

## 2016-01-03 LAB — BASIC METABOLIC PANEL
BUN: 15 mg/dL (ref 6–23)
CALCIUM: 9 mg/dL (ref 8.4–10.5)
CO2: 27 mEq/L (ref 19–32)
CREATININE: 0.62 mg/dL (ref 0.40–1.50)
Chloride: 99 mEq/L (ref 96–112)
GFR: 134.51 mL/min (ref >60.00)
GLUCOSE: 106 mg/dL — AB (ref 70–99)
POTASSIUM: 4.2 meq/L (ref 3.5–5.1)
Sodium: 137 mEq/L (ref 135–145)

## 2016-01-03 LAB — CBC WITH DIFFERENTIAL/PLATELET
BASOS ABS: 0 10*3/uL (ref 0.0–0.1)
Basophils Relative: 0.5 % (ref 0.0–3.0)
EOS ABS: 0.1 10*3/uL (ref 0.0–0.7)
Eosinophils Relative: 1.5 % (ref 0.0–5.0)
HEMATOCRIT: 41.4 % (ref 39.0–52.0)
HEMOGLOBIN: 14.2 g/dL (ref 13.0–17.0)
Lymphocytes Relative: 25.6 % (ref 12.0–46.0)
Lymphs Abs: 1.1 10*3/uL (ref 0.7–4.0)
MCHC: 34.2 g/dL (ref 30.0–36.0)
MCV: 98.6 fl (ref 78.0–100.0)
MONOS PCT: 10.3 % (ref 3.0–12.0)
Monocytes Absolute: 0.4 10*3/uL (ref 0.1–1.0)
Neutro Abs: 2.6 10*3/uL (ref 1.4–7.7)
Neutrophils Relative %: 62.1 % (ref 43.0–77.0)
PLATELETS: 217 10*3/uL (ref 150.0–400.0)
RBC: 4.2 Mil/uL — AB (ref 4.22–5.81)
RDW: 15.1 % (ref 11.5–15.5)
WBC: 4.2 10*3/uL (ref 4.0–10.5)

## 2016-01-03 LAB — HEPATIC FUNCTION PANEL
ALBUMIN: 4.1 g/dL (ref 3.5–5.2)
ALT: 19 U/L (ref 0–53)
AST: 17 U/L (ref 0–37)
Alkaline Phosphatase: 58 U/L (ref 39–117)
BILIRUBIN TOTAL: 0.5 mg/dL (ref 0.2–1.2)
Bilirubin, Direct: 0.1 mg/dL (ref 0.0–0.3)
Total Protein: 7 g/dL (ref 6.0–8.3)

## 2016-01-03 LAB — LIPID PANEL
CHOLESTEROL: 190 mg/dL (ref 0–200)
HDL: 59.2 mg/dL (ref >39.00)
LDL CALC: 114 mg/dL — AB (ref 0–99)
NonHDL: 130.48
TRIGLYCERIDES: 80 mg/dL (ref 0.0–149.0)
Total CHOL/HDL Ratio: 3
VLDL: 16 mg/dL (ref 0.0–40.0)

## 2016-01-03 LAB — TSH: TSH: 1.39 u[IU]/mL (ref 0.35–4.50)

## 2016-01-03 NOTE — Progress Notes (Signed)
   Subjective:    Patient ID: Richard Fernandez, male    DOB: [birth year], 75 y.o.   MRN: [id number]  HPI Patient seen for complete physical. His chronic problems include history of obstructive sleep apnea, obesity, hyperlipidemia, and history of CVA. Sleep apnea is managed by neurology. He remains on Plavix, fluoxetine, and simvastatin. He denies any recent issues with depression. He continues to work full time. Last tetanus 2007. Other immunizations up-to-date. Has never had colonoscopy screening. He is not sure he wishes to pursue colonoscopy. He is interested in considering DNA-based stool testing  Past Medical History  Diagnosis Date  . HYPERLIPIDEMIA 11/29/2010  . DEPRESSION 11/29/2010  . OBSTRUCTIVE SLEEP APNEA 11/29/2010  . CEREBROVASCULAR ACCIDENT, HX OF 11/29/2010  . Stroke ([place])   . Hearing loss    Past Surgical History  Procedure Laterality Date  . Knee surgery    . Hand surgery    . Shoulder arthroscopy with rotator cuff repair    . Back surgery  1962    reports that he quit smoking about 27 years ago. His smoking use included Cigarettes. He has a 30 pack-year smoking history. He has never used smokeless tobacco. He reports that he does not drink alcohol or use illicit drugs. family history includes Breast cancer in his mother; Heart failure in his father. He was adopted. No Known Allergies    Review of Systems  Constitutional: Negative for fever, activity change, appetite change and fatigue.  HENT: Negative for congestion, ear pain and trouble swallowing.   Eyes: Negative for pain and visual disturbance.  Respiratory: Negative for cough, shortness of breath and wheezing.   Cardiovascular: Negative for chest pain and palpitations.  Gastrointestinal: Negative for nausea, vomiting, abdominal pain, diarrhea, constipation, blood in stool, abdominal distention and rectal pain.  Genitourinary: Negative for dysuria, hematuria and testicular pain.  Musculoskeletal: Positive for  arthralgias. Negative for joint swelling.  Skin: Negative for rash.  Neurological: Negative for dizziness, syncope and headaches.  Hematological: Negative for adenopathy.  Psychiatric/Behavioral: Negative for confusion and dysphoric mood.       Objective:   Physical Exam  Constitutional: He is oriented to person, place, and time. He appears well-developed and well-nourished.  HENT:  Right Ear: External ear normal.  Left Ear: External ear normal.  Mouth/Throat: Oropharynx is clear and moist.  Neck: Neck supple. No thyromegaly present.  Cardiovascular: Normal rate and regular rhythm.  Exam reveals no gallop.   Pulmonary/Chest: Effort normal and breath sounds normal. No respiratory distress. He has no wheezes. He has no rales.  Abdominal: Soft. Bowel sounds are normal. He exhibits no distension and no mass. There is no tenderness. There is no rebound and no guarding.  Musculoskeletal: He exhibits no edema.  Lymphadenopathy:    He has no cervical adenopathy.  Neurological: He is alert and oriented to person, place, and time. No cranial nerve deficit.  Psychiatric: He has a normal mood and affect. His behavior is normal.          Assessment & Plan:  Physical exam. Patient needs tetanus booster but apparently his insurance company will not cover this. He defers. Information given on Cologuard and he will check on coverage and let us know if interested. Obtain screening lab work.

## 2016-01-03 NOTE — Patient Instructions (Signed)
Check on coverage for Cologuard and let us know if interested.

## 2016-01-03 NOTE — Progress Notes (Signed)
Pre visit review using our clinic review tool, if applicable. No additional management support is needed unless otherwise documented below in the visit note. 

## 2016-01-08 ENCOUNTER — Encounter: Payer: Self-pay | Admitting: Family Medicine

## 2016-01-08 ENCOUNTER — Ambulatory Visit (INDEPENDENT_AMBULATORY_CARE_PROVIDER_SITE_OTHER): Payer: Commercial Managed Care - HMO | Admitting: Family Medicine

## 2016-01-08 VITALS — BP 110/60 | HR 73 | Temp 97.9°F | Ht 68.0 in | Wt 238.2 lb

## 2016-01-08 DIAGNOSIS — F418 Other specified anxiety disorders: Secondary | ICD-10-CM

## 2016-01-08 MED ORDER — LORAZEPAM 0.5 MG PO TABS: 0.5000 mg | ORAL_TABLET | Freq: Three times a day (TID) | ORAL | Status: DC | PRN

## 2016-01-08 NOTE — Progress Notes (Signed)
   Subjective:    Patient ID: Richard Fernandez, male    DOB: July 24, 1941, 75 y.o.   MRN: [id number]  HPI Here for anxiety symptoms. Patient has been on Prozac 30 mg daily for several years. He has history of recurrent depression. He served in [place] for several years also relates past history of posttraumatic stress disorder. He's had previous counseling for that.  Comes in today with couple stress issues that occurred last Friday. His son is currently out of work and he has been dealing with some stresses regarding that. Patient also relates some stress issues with his work last Friday. He had job performance review and feels that did not go well. He feels as though some work problems were unfairly credited to him.  Past Medical History  Diagnosis Date  . HYPERLIPIDEMIA 11/29/2010  . DEPRESSION 11/29/2010  . OBSTRUCTIVE SLEEP APNEA 11/29/2010  . CEREBROVASCULAR ACCIDENT, HX OF 11/29/2010  . Stroke ([place])   . Hearing loss    Past Surgical History  Procedure Laterality Date  . Knee surgery    . Hand surgery    . Shoulder arthroscopy with rotator cuff repair    . Back surgery  1962    reports that he quit smoking about 27 years ago. His smoking use included Cigarettes. He has a 30 pack-year smoking history. He has never used smokeless tobacco. He reports that he does not drink alcohol or use illicit drugs. family history includes Breast cancer in his mother; Heart failure in his father. He was adopted. No Known Allergies   Review of Systems  Constitutional: Negative for appetite change and unexpected weight change.  Respiratory: Negative for shortness of breath.   Cardiovascular: Negative for chest pain.  Neurological: Negative for dizziness.  Psychiatric/Behavioral: Positive for sleep disturbance. Negative for suicidal ideas and confusion.       Objective:   Physical Exam  Constitutional: He is oriented to person, place, and time. He appears well-developed and well-nourished.    Cardiovascular: Normal rate and regular rhythm.   Pulmonary/Chest: Effort normal and breath sounds normal. No respiratory distress. He has no wheezes. He has no rales.  Neurological: He is alert and oriented to person, place, and time.  Psychiatric: He has a normal mood and affect. His behavior is normal. Judgment and thought content normal.          Assessment & Plan:  Anxiety. Patient has history of situational anxiety and also past history of post traumatic stress disorder. We have strongly advise that he set up counseling. Continue Prozac. Agreed to very limited lorazepam 0.5 mg 1 daily at bedtime for severe anxiety symptoms #20 with no refills. Avoid regular use.

## 2016-01-08 NOTE — Progress Notes (Signed)
Pre visit review using our clinic review tool, if applicable. No additional management support is needed unless otherwise documented below in the visit note. 

## 2016-01-19 ENCOUNTER — Telehealth: Payer: Self-pay | Admitting: Family Medicine

## 2016-01-19 NOTE — Telephone Encounter (Signed)
Chart Up to Date.

## 2016-01-19 NOTE — Telephone Encounter (Signed)
Pt no longer want his medication from [facility] mail order. Pt will use [facility]. Pt will call us when he needs refills

## 2016-01-25 ENCOUNTER — Telehealth: Payer: Self-pay | Admitting: Family Medicine

## 2016-01-25 NOTE — Telephone Encounter (Signed)
Pt said the following med is working and would like to have some more LORazepam (ATIVAN) 0.5 MG tablet     [facility]

## 2016-01-26 NOTE — Telephone Encounter (Signed)
Last refill was 01/08/2016 #20. Please advise.

## 2016-01-26 NOTE — Telephone Encounter (Signed)
Refill once but I do not advise regular/chronic use.

## 2016-01-27 ENCOUNTER — Other Ambulatory Visit: Payer: Self-pay | Admitting: Family Medicine

## 2016-01-29 ENCOUNTER — Other Ambulatory Visit: Payer: Self-pay | Admitting: Family Medicine

## 2016-01-29 MED ORDER — LORAZEPAM 0.5 MG PO TABS: 0.5000 mg | ORAL_TABLET | Freq: Three times a day (TID) | ORAL | Status: DC | PRN

## 2016-01-29 NOTE — Telephone Encounter (Signed)
Verbally called into the pharmacy.  

## 2016-02-05 ENCOUNTER — Other Ambulatory Visit: Payer: Self-pay | Admitting: Family Medicine

## 2016-02-05 ENCOUNTER — Ambulatory Visit (INDEPENDENT_AMBULATORY_CARE_PROVIDER_SITE_OTHER): Payer: Medicare HMO | Admitting: Psychology

## 2016-02-05 DIAGNOSIS — F4323 Adjustment disorder with mixed anxiety and depressed mood: Secondary | ICD-10-CM | POA: Diagnosis not present

## 2016-02-21 ENCOUNTER — Ambulatory Visit: Payer: Commercial Managed Care - HMO | Admitting: Psychology

## 2016-02-27 ENCOUNTER — Other Ambulatory Visit: Payer: Self-pay | Admitting: Family Medicine

## 2016-02-27 ENCOUNTER — Telehealth: Payer: Self-pay | Admitting: *Deleted

## 2016-02-27 NOTE — Telephone Encounter (Signed)
[facility] [address] ([phone] Refill request LORazepam (ATIVAN) 0.5 MG tablet

## 2016-02-27 NOTE — Telephone Encounter (Signed)
Duplicate medication request

## 2016-03-18 ENCOUNTER — Other Ambulatory Visit: Payer: Self-pay | Admitting: Family Medicine

## 2016-04-19 ENCOUNTER — Other Ambulatory Visit: Payer: Self-pay | Admitting: Family Medicine

## 2016-04-19 NOTE — Telephone Encounter (Signed)
Refill once.  Avoid regular use. 

## 2016-04-19 NOTE — Telephone Encounter (Signed)
Last OV 3\6\17... Last filled 4\27\17, #20 with 0 refills... Okay to refill?

## 2016-05-20 ENCOUNTER — Telehealth: Payer: Self-pay | Admitting: Family Medicine

## 2016-05-20 MED ORDER — LORAZEPAM 0.5 MG PO TABS: ORAL_TABLET | ORAL | Status: DC

## 2016-05-20 NOTE — Telephone Encounter (Signed)
Pt needs a refill on lorazepam #20 w/refills send to [facility]

## 2016-05-20 NOTE — Telephone Encounter (Signed)
After speaking with pt he did confirm all the medications that were on his list. He is unsure of the medication he is wanting but feels that he has left overs from when he got it last. He will call back in the morning to let us know the name. He says the swelling is very minimal and will also consider a follow up if its a big concern.  

## 2016-05-20 NOTE — Telephone Encounter (Signed)
Message below was entered into patients chart in error. Back to his request, I have review his chart and appts are UTD. I have verbally called in for him.

## 2016-06-10 ENCOUNTER — Other Ambulatory Visit: Payer: Self-pay | Admitting: Family Medicine

## 2016-06-12 ENCOUNTER — Other Ambulatory Visit: Payer: Self-pay | Admitting: Family Medicine

## 2016-06-12 NOTE — Telephone Encounter (Signed)
Last OV 01/08/2016 Pending 06/25/2016 Last refill 05/20/2016 #20

## 2016-06-12 NOTE — Telephone Encounter (Signed)
Last OV 01/08/2016 Pending 06/25/2016  Last refill

## 2016-06-13 NOTE — Telephone Encounter (Signed)
I left a message for the pt to return my call. 

## 2016-06-13 NOTE — Telephone Encounter (Signed)
I recommend against chronic use. Can be associated with much higher risk of falls- especially in elderly.  I know he was taking only prn but we should try to explore other options.  Offer follow up to discuss.  We might wish to consider titrating his Fluoxetine.

## 2016-06-14 ENCOUNTER — Ambulatory Visit (INDEPENDENT_AMBULATORY_CARE_PROVIDER_SITE_OTHER): Payer: Commercial Managed Care - HMO | Admitting: Family Medicine

## 2016-06-14 ENCOUNTER — Encounter: Payer: Self-pay | Admitting: Family Medicine

## 2016-06-14 VITALS — BP 110/78 | HR 76 | Temp 98.3°F | Ht 68.0 in | Wt 235.0 lb

## 2016-06-14 DIAGNOSIS — F411 Generalized anxiety disorder: Secondary | ICD-10-CM | POA: Diagnosis not present

## 2016-06-14 MED ORDER — BUSPIRONE HCL 7.5 MG PO TABS: 7.5000 mg | ORAL_TABLET | Freq: Two times a day (BID) | ORAL | 1 refills | Status: DC

## 2016-06-14 NOTE — Patient Instructions (Signed)
Start Buspar 7.5 mg twice daily After 2 weeks if no improvement, increase to two twice daily.

## 2016-06-14 NOTE — Progress Notes (Signed)
Subjective:     Patient ID: Richard Fernandez, male   DOB: November 08, 1940, 75 y.o.   MRN: [id number]  HPI Patient is here to discuss anxiety issues. He relates most of this to work anxiety He has taken low-dose lorazepam in the past and we did discourage regular benzodiazepine use.  Already takes Prozac 30 mg daily but does not feel this is helping his anxiety symptoms much. He feels his depression is stable on Prozac He has had significant counseling in the past.  Past Medical History:  Diagnosis Date  . CEREBROVASCULAR ACCIDENT, HX OF 11/29/2010  . DEPRESSION 11/29/2010  . Hearing loss   . HYPERLIPIDEMIA 11/29/2010  . OBSTRUCTIVE SLEEP APNEA 11/29/2010  . Stroke [facility])    Past Surgical History:  Procedure Laterality Date  . [place]  . HAND SURGERY    . KNEE SURGERY    . SHOULDER ARTHROSCOPY WITH ROTATOR CUFF REPAIR      reports that he quit smoking about 27 years ago. His smoking use included Cigarettes. He has a 30.00 pack-year smoking history. He has never used smokeless tobacco. He reports that he does not drink alcohol or use drugs. family history includes Breast cancer in his mother; Heart failure in his father. He was adopted. No Known Allergies   Review of Systems  Psychiatric/Behavioral: Negative for agitation and dysphoric mood. The patient is nervous/anxious.        Objective:   Physical Exam  Constitutional: He appears well-developed and well-nourished.  Cardiovascular: Normal rate and regular rhythm.   Neurological: He is alert.  Psychiatric: He has a normal mood and affect. His behavior is normal. Judgment and thought content normal.       Assessment:     Anxiety. This appears to be more situational    Plan:     -We recommend he continue with regular counseling -Avoid regular use of benzodiazepines -Trial of BuSpar 7.5 mg twice daily after 2 weeks consider titration up to 15 mg twice daily if needed  [name] MD [facility] at  [facility]

## 2016-06-14 NOTE — Telephone Encounter (Signed)
Patient called back and I informed him of the message below.  Appt scheduled for today at 11am.

## 2016-06-14 NOTE — Progress Notes (Signed)
Pre visit review using our clinic review tool, if applicable. No additional management support is needed unless otherwise documented below in the visit note. 

## 2016-06-17 ENCOUNTER — Ambulatory Visit: Payer: Commercial Managed Care - HMO | Admitting: Family Medicine

## 2016-06-25 ENCOUNTER — Ambulatory Visit: Payer: Commercial Managed Care - HMO | Admitting: Adult Health

## 2016-06-26 ENCOUNTER — Telehealth: Payer: Self-pay | Admitting: Neurology

## 2016-06-26 NOTE — Telephone Encounter (Signed)
I spoke to pt. He is not having any problems with his cpap; he just has a loaner cpap while his cpap gets fixed. Pt is agreeable to pushing his appt out until October, instead of 8/28. Appt made. Pt verbalized understanding.

## 2016-06-26 NOTE — Telephone Encounter (Signed)
Patient called regarding card that he is to bring from CPAP machine to August 28th appointment, patient states his machine broke and he has a [employer]er[employer], machine does not have a card, please advise.

## 2016-07-01 ENCOUNTER — Ambulatory Visit: Payer: Commercial Managed Care - HMO | Admitting: Adult Health

## 2016-07-13 ENCOUNTER — Other Ambulatory Visit: Payer: Self-pay | Admitting: Family Medicine

## 2016-07-15 ENCOUNTER — Other Ambulatory Visit: Payer: Self-pay | Admitting: Family Medicine

## 2016-08-08 ENCOUNTER — Encounter: Payer: Self-pay | Admitting: Family Medicine

## 2016-08-08 DIAGNOSIS — H04123 Dry eye syndrome of bilateral lacrimal glands: Secondary | ICD-10-CM | POA: Diagnosis not present

## 2016-08-08 DIAGNOSIS — H2513 Age-related nuclear cataract, bilateral: Secondary | ICD-10-CM | POA: Diagnosis not present

## 2016-08-08 DIAGNOSIS — H40013 Open angle with borderline findings, low risk, bilateral: Secondary | ICD-10-CM | POA: Diagnosis not present

## 2016-08-08 DIAGNOSIS — H25013 Cortical age-related cataract, bilateral: Secondary | ICD-10-CM | POA: Diagnosis not present

## 2016-08-08 DIAGNOSIS — H40051 Ocular hypertension, right eye: Secondary | ICD-10-CM | POA: Diagnosis not present

## 2016-08-12 ENCOUNTER — Ambulatory Visit: Payer: Self-pay | Admitting: Neurology

## 2016-08-12 DIAGNOSIS — H5213 Myopia, bilateral: Secondary | ICD-10-CM | POA: Diagnosis not present

## 2016-08-12 DIAGNOSIS — H52209 Unspecified astigmatism, unspecified eye: Secondary | ICD-10-CM | POA: Diagnosis not present

## 2016-08-12 DIAGNOSIS — H524 Presbyopia: Secondary | ICD-10-CM | POA: Diagnosis not present

## 2016-08-19 ENCOUNTER — Other Ambulatory Visit: Payer: Self-pay | Admitting: Family Medicine

## 2016-09-20 ENCOUNTER — Other Ambulatory Visit: Payer: Self-pay | Admitting: Family Medicine

## 2016-09-30 ENCOUNTER — Other Ambulatory Visit: Payer: Self-pay | Admitting: Family Medicine

## 2016-10-07 ENCOUNTER — Observation Stay (HOSPITAL_COMMUNITY)
Admission: EM | Admit: 2016-10-07 | Discharge: 2016-10-09 | Disposition: A | Discharge status: Home / Self Care | Payer: Commercial Managed Care - HMO | Attending: Internal Medicine | Admitting: Internal Medicine

## 2016-10-07 ENCOUNTER — Emergency Department (HOSPITAL_COMMUNITY): Payer: Commercial Managed Care - HMO

## 2016-10-07 ENCOUNTER — Encounter (HOSPITAL_COMMUNITY): Payer: Self-pay | Admitting: Emergency Medicine

## 2016-10-07 DIAGNOSIS — G4733 Obstructive sleep apnea (adult) (pediatric): Secondary | ICD-10-CM | POA: Diagnosis not present

## 2016-10-07 DIAGNOSIS — F329 Major depressive disorder, single episode, unspecified: Secondary | ICD-10-CM | POA: Diagnosis present

## 2016-10-07 DIAGNOSIS — E785 Hyperlipidemia, unspecified: Secondary | ICD-10-CM | POA: Diagnosis present

## 2016-10-07 DIAGNOSIS — Z79899 Other long term (current) drug therapy: Secondary | ICD-10-CM | POA: Diagnosis not present

## 2016-10-07 DIAGNOSIS — M6281 Muscle weakness (generalized): Secondary | ICD-10-CM

## 2016-10-07 DIAGNOSIS — R531 Weakness: Principal | ICD-10-CM

## 2016-10-07 DIAGNOSIS — F4310 Post-traumatic stress disorder, unspecified: Secondary | ICD-10-CM | POA: Diagnosis not present

## 2016-10-07 DIAGNOSIS — E669 Obesity, unspecified: Secondary | ICD-10-CM | POA: Diagnosis present

## 2016-10-07 DIAGNOSIS — Z87891 Personal history of nicotine dependence: Secondary | ICD-10-CM | POA: Insufficient documentation

## 2016-10-07 DIAGNOSIS — R251 Tremor, unspecified: Secondary | ICD-10-CM | POA: Insufficient documentation

## 2016-10-07 DIAGNOSIS — F32A Depression, unspecified: Secondary | ICD-10-CM | POA: Diagnosis present

## 2016-10-07 DIAGNOSIS — Z7902 Long term (current) use of antithrombotics/antiplatelets: Secondary | ICD-10-CM | POA: Diagnosis not present

## 2016-10-07 DIAGNOSIS — R29818 Other symptoms and signs involving the nervous system: Secondary | ICD-10-CM | POA: Diagnosis not present

## 2016-10-07 DIAGNOSIS — F319 Bipolar disorder, unspecified: Secondary | ICD-10-CM | POA: Diagnosis not present

## 2016-10-07 DIAGNOSIS — R29898 Other symptoms and signs involving the musculoskeletal system: Secondary | ICD-10-CM | POA: Diagnosis present

## 2016-10-07 DIAGNOSIS — Z8673 Personal history of transient ischemic attack (TIA), and cerebral infarction without residual deficits: Secondary | ICD-10-CM

## 2016-10-07 DIAGNOSIS — K219 Gastro-esophageal reflux disease without esophagitis: Secondary | ICD-10-CM | POA: Diagnosis not present

## 2016-10-07 HISTORY — DX: Post-traumatic stress disorder, unspecified: F43.10

## 2016-10-07 LAB — DIFFERENTIAL
BAND NEUTROPHILS: 0 %
BASOS PCT: 1 %
Basophils Absolute: 0 10*3/uL (ref 0.0–0.1)
Blasts: 0 %
EOS ABS: 0 10*3/uL (ref 0.0–0.7)
Eosinophils Relative: 1 %
LYMPHS ABS: 0.9 10*3/uL (ref 0.7–4.0)
Lymphocytes Relative: 23 %
METAMYELOCYTES PCT: 0 %
MONO ABS: 0.2 10*3/uL (ref 0.1–1.0)
MYELOCYTES: 0 %
Monocytes Relative: 6 %
NEUTROS ABS: 2.9 10*3/uL (ref 1.7–7.7)
NRBC: 0 /100{WBCs}
Neutrophils Relative %: 69 %
OTHER: 0 %
PROMYELOCYTES ABS: 0 %

## 2016-10-07 LAB — I-STAT TROPONIN, ED: TROPONIN I, POC: 0 ng/mL (ref 0.00–0.08)

## 2016-10-07 LAB — COMPREHENSIVE METABOLIC PANEL
ALT: 21 U/L (ref 17–63)
ANION GAP: 8 (ref 5–15)
AST: 20 U/L (ref 15–41)
Albumin: 3.6 g/dL (ref 3.5–5.0)
Alkaline Phosphatase: 52 U/L (ref 38–126)
BUN: 9 mg/dL (ref 6–20)
CHLORIDE: 102 mmol/L (ref 101–111)
CO2: 26 mmol/L (ref 22–32)
CREATININE: 0.69 mg/dL (ref 0.61–1.24)
Calcium: 8.8 mg/dL — ABNORMAL LOW (ref 8.9–10.3)
Glucose, Bld: 112 mg/dL — ABNORMAL HIGH (ref 65–99)
POTASSIUM: 4 mmol/L (ref 3.5–5.1)
SODIUM: 136 mmol/L (ref 135–145)
Total Bilirubin: 0.8 mg/dL (ref 0.3–1.2)
Total Protein: 6.5 g/dL (ref 6.5–8.1)

## 2016-10-07 LAB — CBC
HEMATOCRIT: 41.5 % (ref 39.0–52.0)
Hemoglobin: 14.2 g/dL (ref 13.0–17.0)
MCH: 33.6 pg (ref 26.0–34.0)
MCHC: 34.2 g/dL (ref 30.0–36.0)
MCV: 98.3 fL (ref 78.0–100.0)
PLATELETS: 205 10*3/uL (ref 150–400)
RBC: 4.22 MIL/uL (ref 4.22–5.81)
RDW: 13.9 % (ref 11.5–15.5)
WBC: 4 10*3/uL (ref 4.0–10.5)

## 2016-10-07 LAB — PROTIME-INR
INR: 1.01
PROTHROMBIN TIME: 13.3 s (ref 11.4–15.2)

## 2016-10-07 LAB — I-STAT CHEM 8, ED
BUN: 10 mg/dL (ref 6–20)
Calcium, Ion: 1.07 mmol/L — ABNORMAL LOW (ref 1.15–1.40)
Chloride: 98 mmol/L — ABNORMAL LOW (ref 101–111)
Creatinine, Ser: 0.7 mg/dL (ref 0.61–1.24)
GLUCOSE: 110 mg/dL — AB (ref 65–99)
HCT: 42 % (ref 39.0–52.0)
HEMOGLOBIN: 14.3 g/dL (ref 13.0–17.0)
POTASSIUM: 4 mmol/L (ref 3.5–5.1)
SODIUM: 136 mmol/L (ref 135–145)
TCO2: 27 mmol/L (ref 0–100)

## 2016-10-07 LAB — APTT: aPTT: 25 seconds (ref 24–36)

## 2016-10-07 MED ORDER — SODIUM CHLORIDE 0.9 % IV SOLN
INTRAVENOUS | Status: AC
  Administered 2016-10-07: 15:00:00 via INTRAVENOUS

## 2016-10-07 MED ORDER — CLOPIDOGREL BISULFATE 75 MG PO TABS
75.0000 mg | ORAL_TABLET | Freq: Every day | ORAL | Status: DC
  Administered 2016-10-07 – 2016-10-09 (×3): 75 mg via ORAL
  Filled 2016-10-07 (×3): qty 1

## 2016-10-07 MED ORDER — ACETAMINOPHEN 325 MG PO TABS
650.0000 mg | ORAL_TABLET | Freq: Four times a day (QID) | ORAL | Status: DC | PRN
  Administered 2016-10-08: 650 mg via ORAL
  Filled 2016-10-07: qty 2

## 2016-10-07 MED ORDER — ONDANSETRON HCL 4 MG/2ML IJ SOLN: 4.0000 mg | Freq: Four times a day (QID) | INTRAMUSCULAR | Status: DC | PRN

## 2016-10-07 MED ORDER — SODIUM CHLORIDE 0.9% FLUSH
3.0000 mL | Freq: Two times a day (BID) | INTRAVENOUS | Status: DC
  Administered 2016-10-07: 3 mL via INTRAVENOUS
  Administered 2016-10-08: 10 mL via INTRAVENOUS

## 2016-10-07 MED ORDER — SIMVASTATIN 40 MG PO TABS
40.0000 mg | ORAL_TABLET | Freq: Every day | ORAL | Status: DC
  Administered 2016-10-07 – 2016-10-08 (×2): 40 mg via ORAL
  Filled 2016-10-07 (×2): qty 1

## 2016-10-07 MED ORDER — FLUOXETINE HCL 20 MG PO CAPS
30.0000 mg | ORAL_CAPSULE | Freq: Every day | ORAL | Status: DC
  Administered 2016-10-08 – 2016-10-09 (×2): 30 mg via ORAL
  Filled 2016-10-07 (×2): qty 1

## 2016-10-07 MED ORDER — BUSPIRONE HCL 15 MG PO TABS
7.5000 mg | ORAL_TABLET | Freq: Two times a day (BID) | ORAL | Status: DC
Start: 2016-10-07 — End: 2016-10-09
  Administered 2016-10-07 – 2016-10-09 (×4): 7.5 mg via ORAL
  Filled 2016-10-07 (×5): qty 1

## 2016-10-07 MED ORDER — FLUOXETINE HCL 10 MG PO CAPS: 10.0000 mg | ORAL_CAPSULE | Freq: Every day | ORAL | Status: DC

## 2016-10-07 MED ORDER — ACETAMINOPHEN 650 MG RE SUPP: 650.0000 mg | Freq: Four times a day (QID) | RECTAL | Status: DC | PRN

## 2016-10-07 MED ORDER — ONDANSETRON HCL 4 MG PO TABS: 4.0000 mg | ORAL_TABLET | Freq: Four times a day (QID) | ORAL | Status: DC | PRN

## 2016-10-07 MED ORDER — ARTIFICIAL TEARS OP OINT: TOPICAL_OINTMENT | Freq: Every day | OPHTHALMIC | Status: DC | PRN

## 2016-10-07 NOTE — Progress Notes (Signed)
Received from ED in a bed; patient is alert and cooperative; wife present at bedside; oriented patient and spouse to room and unit routine.
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
## 2016-10-07 NOTE — Consult Note (Signed)
Neurology Consultation Reason for Consult: Left-sided weakness Referring Physician: Lita Mains, D  CC: Left-sided weakness  History is obtained from: Patient  HPI: Richard Fernandez is a 75 y.o. male who states that he woke up around 4:30 AM and went back to bed. When he next awoke, he already had some symptoms. He complains that his stomach is "hard as a rock" and he is having trouble moving his left side. He also complains of some numbness of the left arm and leg. He has a history of hyperlipidemia and stroke.   LKW: 4:30 AM tpa given?: no, out of window   ROS: A 14 point ROS was performed and is negative except as noted in the HPI.   Past Medical History:  Diagnosis Date  . CEREBROVASCULAR ACCIDENT, HX OF 11/29/2010  . DEPRESSION 11/29/2010  . Hearing loss   . HYPERLIPIDEMIA 11/29/2010  . OBSTRUCTIVE SLEEP APNEA 11/29/2010  . Stroke [facility])      Family History  Problem Relation Age of Onset  . Adopted: Yes  . Breast cancer Mother   . Heart failure Father      Social History:  reports that he quit smoking about 27 years ago. His smoking use included Cigarettes. He has a 30.00 pack-year smoking history. He has never used smokeless tobacco. He reports that he does not drink alcohol or use drugs.   Exam: Current vital signs: BP 129/64 (BP Location: Right Arm)   Pulse (!) 57   Resp 14   SpO2 100%  Vital signs in last 24 hours: Pulse Rate:  [57] 57 (12/04 1031) Resp:  [14] 14 (12/04 1031) BP: (129)/(64) 129/64 (12/04 1031) SpO2:  [100 %] 100 % (12/04 1031)   Physical Exam  Constitutional: Appears well-developed and well-nourished.  Psych: Affect appropriate to situation Eyes: No scleral injection HENT: No OP obstrucion Head: Normocephalic.  Cardiovascular: Normal rate and regular rhythm.  Respiratory: Effort normal  GI: Soft.   Skin: WDI  Neuro: Mental Status: Patient is awake, alert, oriented to person, place, month, year, and situation. Patient is able to give a  clear and coherent history. No signs of aphasia or neglect Cranial Nerves: II: Visual Fields are full, though he does give slightly inconsistent responses. Pupils are equal, round, and reactive to light.   III,IV, VI: EOMI without ptosis or diploplia.  V: Facial sensation is decreased on the left side, he does split midline to vibration VII: Facial movement is symmetric.  VIII: hearing is intact to voice X: Uvula elevates symmetrically XI: Shoulder shrug is symmetric. XII: tongue is midline without atrophy or fasciculations.  Motor: Tone is normal. Bulk is normal. 5/5 strength was present on the right, on the left he has marked give way weakness on the left. When asking him to do heel-knee-shin, he is able to sustain his right leg out of the bed with seemingly full strength, but when asking to test his strength, he has 2/5 weakness of the leg with a clear give way character he also has clear give way weakness of his left arm, and does avoid his face when it is held above his face Sensory: Sensation is decreased throughout the left side Cerebellar: No clear ataxia, but doe snot cooperate with formal testing on the left  I have reviewed labs in epic and the results pertinent to this consultation are: cmp - unremarkable.   I have reviewed the images obtained: CT head-unremarkable  Impression: 75 year old male with findings on exam most consistent with psychogenic weakness. He  is outside the window for IV TPA in any case. I encouraged him to improve.   Recommendations: 1) encourage improvement 2) if he continues to have severe symptoms, could consider repeating head CT in 24-48 hours. 3) no further recommendations at this time, please call with any further questions or concerns.  Roland Rack, MD [facility] [phone]  If 7pm- 7am, please page neurology on call as listed in [place].
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
## 2016-10-07 NOTE — ED Triage Notes (Signed)
Patient complains of increased left-sided weakness that started when he woke up this morning.  Patient states he woke up at 0430 this morning and was normal, went back to sleep, and had the increased weakness.  Patient has a history of left sided weakness from a previous stroke.  Patient is alert and oriented at this time.

## 2016-10-07 NOTE — ED Notes (Signed)
Phlebotomy at bedside waiting on pt to come back from CT.

## 2016-10-07 NOTE — ED Notes (Signed)
Pt. 's wife came up to [facility] , she stated, "My husband is having a stroke,"  When asked what and when the symptoms are. LT. Arm is flaccid and they symptoms began 1 hour ago , 9:00 am.   Assisted pt. Into the Wheelchair out of the car. Pt. Is alert and oriented X4. Skin is warm, pink and dry.  Speech is clear.  Lt. Arm is faccid, pt was only able to shuffle to walk.

## 2016-10-07 NOTE — ED Notes (Signed)
Responded to cardiac monitor alarm indicating Vtach with rate in 190's.  Patient alert and oriented.  Patient left arm tremulous, right arm holding full urinal with minor tremor.  After I took the urinal from the patient, the severity of the tremor in the right arm became equal to the left arm.  Auscultated and palpated rate 74.  Patient asking "Why am I not hungry?"

## 2016-10-07 NOTE — ED Notes (Signed)
Activated Code Stroke 

## 2016-10-07 NOTE — Code Documentation (Signed)
75yo male arriving to [facility] via private vehicle at 5.  Patient from home where he woke up at 0430 with increased left sided weakness.  Patient presented to the ED where a code stroke was called.  Patient with h/o stroke with residual left sided weakness.  LKW yesterday 2200 prior to bed.  Patient to CT on arrival.  Stroke team to the bedside.  CT completed.  NIHSS 3, see documentation for details and code stroke times.  Patient with left arm weakness and decreased sensation on the left side on exam.  Patient is outside the window for treatment with tPA.  No acute stroke treatment at this time.  Bedside handoff with ED RN Carlis Abbott.

## 2016-10-08 ENCOUNTER — Observation Stay (HOSPITAL_COMMUNITY): Payer: Commercial Managed Care - HMO

## 2016-10-08 ENCOUNTER — Encounter (HOSPITAL_COMMUNITY): Payer: Self-pay | Admitting: *Deleted

## 2016-10-08 DIAGNOSIS — Z9889 Other specified postprocedural states: Secondary | ICD-10-CM | POA: Diagnosis not present

## 2016-10-08 DIAGNOSIS — Z79899 Other long term (current) drug therapy: Secondary | ICD-10-CM | POA: Diagnosis not present

## 2016-10-08 DIAGNOSIS — F4310 Post-traumatic stress disorder, unspecified: Secondary | ICD-10-CM | POA: Diagnosis not present

## 2016-10-08 DIAGNOSIS — M50222 Other cervical disc displacement at C5-C6 level: Secondary | ICD-10-CM | POA: Diagnosis not present

## 2016-10-08 DIAGNOSIS — F320 Major depressive disorder, single episode, mild: Secondary | ICD-10-CM | POA: Diagnosis not present

## 2016-10-08 DIAGNOSIS — R29898 Other symptoms and signs involving the musculoskeletal system: Secondary | ICD-10-CM

## 2016-10-08 DIAGNOSIS — F4312 Post-traumatic stress disorder, chronic: Secondary | ICD-10-CM

## 2016-10-08 DIAGNOSIS — R251 Tremor, unspecified: Secondary | ICD-10-CM | POA: Diagnosis not present

## 2016-10-08 DIAGNOSIS — Z87891 Personal history of nicotine dependence: Secondary | ICD-10-CM

## 2016-10-08 DIAGNOSIS — E785 Hyperlipidemia, unspecified: Secondary | ICD-10-CM | POA: Diagnosis not present

## 2016-10-08 DIAGNOSIS — Z7902 Long term (current) use of antithrombotics/antiplatelets: Secondary | ICD-10-CM | POA: Diagnosis not present

## 2016-10-08 DIAGNOSIS — K219 Gastro-esophageal reflux disease without esophagitis: Secondary | ICD-10-CM | POA: Diagnosis not present

## 2016-10-08 DIAGNOSIS — G4733 Obstructive sleep apnea (adult) (pediatric): Secondary | ICD-10-CM

## 2016-10-08 DIAGNOSIS — R531 Weakness: Secondary | ICD-10-CM | POA: Diagnosis not present

## 2016-10-08 DIAGNOSIS — F319 Bipolar disorder, unspecified: Secondary | ICD-10-CM | POA: Diagnosis not present

## 2016-10-08 DIAGNOSIS — Z8673 Personal history of transient ischemic attack (TIA), and cerebral infarction without residual deficits: Secondary | ICD-10-CM | POA: Diagnosis not present

## 2016-10-08 LAB — BASIC METABOLIC PANEL
Anion gap: 6 (ref 5–15)
BUN: 14 mg/dL (ref 6–20)
CHLORIDE: 101 mmol/L (ref 101–111)
CO2: 26 mmol/L (ref 22–32)
Calcium: 8.3 mg/dL — ABNORMAL LOW (ref 8.9–10.3)
Creatinine, Ser: 0.68 mg/dL (ref 0.61–1.24)
GFR calc non Af Amer: >60 mL/min (ref >60)
Glucose, Bld: 93 mg/dL (ref 65–99)
POTASSIUM: 3.8 mmol/L (ref 3.5–5.1)
SODIUM: 133 mmol/L — AB (ref 135–145)

## 2016-10-08 LAB — CBC
HEMATOCRIT: 37.8 % — AB (ref 39.0–52.0)
HEMOGLOBIN: 12.8 g/dL — AB (ref 13.0–17.0)
MCH: 33.5 pg (ref 26.0–34.0)
MCHC: 33.9 g/dL (ref 30.0–36.0)
MCV: 99 fL (ref 78.0–100.0)
Platelets: 190 10*3/uL (ref 150–400)
RBC: 3.82 MIL/uL — AB (ref 4.22–5.81)
RDW: 13.9 % (ref 11.5–15.5)
WBC: 4.7 10*3/uL (ref 4.0–10.5)

## 2016-10-08 LAB — TROPONIN I

## 2016-10-08 MED ORDER — PROPRANOLOL HCL ER 60 MG PO CP24
60.0000 mg | ORAL_CAPSULE | Freq: Every day | ORAL | Status: DC
  Filled 2016-10-08: qty 1

## 2016-10-08 MED ORDER — GI COCKTAIL ~~LOC~~: 30.0000 mL | Freq: Three times a day (TID) | ORAL | Status: DC | PRN

## 2016-10-08 MED ORDER — ENOXAPARIN SODIUM 40 MG/0.4ML ~~LOC~~ SOLN
40.0000 mg | SUBCUTANEOUS | Status: DC
  Administered 2016-10-08: 40 mg via SUBCUTANEOUS
  Filled 2016-10-08: qty 0.4

## 2016-10-08 MED ORDER — PANTOPRAZOLE SODIUM 40 MG PO TBEC
40.0000 mg | DELAYED_RELEASE_TABLET | Freq: Every day | ORAL | Status: DC
  Administered 2016-10-08 – 2016-10-09 (×2): 40 mg via ORAL
  Filled 2016-10-08 (×2): qty 1

## 2016-10-08 MED ORDER — GABAPENTIN 100 MG PO CAPS
100.0000 mg | ORAL_CAPSULE | Freq: Two times a day (BID) | ORAL | Status: DC
  Administered 2016-10-08 – 2016-10-09 (×2): 100 mg via ORAL
  Filled 2016-10-08 (×2): qty 1

## 2016-10-08 NOTE — Progress Notes (Signed)
Subjective: Still complaining of left-sided weakness.  Exam: Vitals:   10/08/16 0756 10/08/16 0949  BP: 126/68 110/66  Pulse: 100 (!) 54  Resp:  16  Temp: 98.1 F (36.7 C) 97.6 F (36.4 C)       Gen: In bed, NAD MS: She is alert and oriented following all commands without difficulty CN: Cranial nerves II through XII are grossly intact Motor: Patient has full strength with right upper and lower extremity, when asked to show left upper extremity strength patient has significant give way however when distracted and/or may didn't do movements very quickly he showed 5/5 strength. Today he had good 4/5 grip. Right lower extremity was able to lift off the bed with good 5/5 strength. Left lower extremity patient was able to hold above the bed approximately 1 foot however when he was placed on the leg he quickly allowed to fall to the ground. Sensory: Patient endorses patchy decreased sensation that does not follow any dermatomal distribution along the left arm and leg.   Pertinent Labs/Diagnostics: Previous CT of head was unremarkable. We'll obtain CT of of cervical spine.  Etta Quill PA-C [facility] [phone]  Impression:  75 year old male with findings on exam most consistent with psychogenic weakness. I encouraged him to improve.   Recommendations: 1) if CT of cervical spine is negative no further neurological diagnostic testing recommended. Patient will likely improve with physical therapy. If CT of cervical spine again is negative neurology will sign off.  Dr. Leonel Ramsay to cosign this note.  10/08/2016, 11:41 AM  Greatly improved by the time of my exam. He has essentially returned to baseline. I discussed with him the diagnosis of conversion reaction, and he was accepting of the diagnosis.  At this time no further recommendations, neurology will sign off.  Roland Rack, MD [facility] [phone]  If 7pm- 7am, please page neurology on  call as listed in [place].

## 2016-10-08 NOTE — Care Management Note (Signed)
Case Management Note  Patient Details  Name: Richard Fernandez MRN: [id number] Date of Birth: December 18, 1940  Subjective/Objective:   Pt in with lt sided weakness. He is from home with his wife.                  Action/Plan: Awaiting PT/OT recommendations. CM following for d/c needs.   Expected Discharge Date:                  Expected Discharge Plan:  Home/Self Care  In-House Referral:     Discharge planning Services     Post Acute Care Choice:    Choice offered to:     DME Arranged:    DME Agency:     HH Arranged:    HH Agency:     Status of Service:  In process, will continue to follow  If discussed at Long Length of Stay Meetings, dates discussed:    Additional Comments:  Pollie Friar, RN 10/08/2016, 2:32 PM

## 2016-10-08 NOTE — Progress Notes (Signed)
Placed patient on CPAP for the night.  Patient was tolerating well at this time.Richard Fernandez

## 2016-10-08 NOTE — Progress Notes (Signed)
Seen and examined Spouse at bedside Claims to have tremors/left sided weakness/dysarthria-but when he is distracted he has none of these signs on exam. CT head negative, when distracted-has significant more strength in his left arm.  He also claims to have epigastric pain-but seems mild. It seems that he is under a lot of stress after loosing his job about a month ago, and is anxious at being at home alone. Check Trop, start PPI Await PT Have consulted Psych Full note to follow
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
## 2016-10-08 NOTE — Evaluation (Signed)
Physical Therapy Evaluation Patient Details Name: Richard Fernandez MRN: [id number] DOB: [birth year] Today's Date: 10/08/2016   History of Present Illness  Pt is a 75 y/o male who presents with tremor in L hand/arm and L sided weakness. CT negative for acute abnormalities and per chart review, symptoms may have a psychogenic component.  Clinical Impression  Pt admitted with above diagnosis. Pt currently with functional limitations due to the deficits listed below (see PT Problem List). At the time of PT eval pt was able to perform transfers and ambulation with supervision to min guard assist for balance and safety. Pt states he is near baseline but points out the tremor in the L hand. MMT did not reveal significant strength deficits on the L side and pt reports no sensory deficits aside from neuropathy which was present PTA. Pt will benefit from skilled PT to increase their independence and safety with mobility to allow discharge to the venue listed below.       Follow Up Recommendations Home health PT    Equipment Recommendations  None recommended by PT    Recommendations for Other Services       Precautions / Restrictions Precautions Precautions: Fall Precaution Comments: Pt reports he has not fallen in 3 months. Previous stroke with R sided residual weakness - wears a full leg brace on the R side.  Restrictions Weight Bearing Restrictions: No      Mobility  Bed Mobility Overal bed mobility: Needs Assistance Bed Mobility: Supine to Sit     Supine to sit: Supervision     General bed mobility comments: No assist required.   Transfers Overall transfer level: Needs assistance Equipment used: Rolling walker (2 wheeled) Transfers: Sit to/from Stand Sit to Stand: Min guard         General transfer comment: Close  guard for safety. Pt was able to power up to full standing without assist.   Ambulation/Gait Ambulation/Gait assistance: Min guard;Supervision Ambulation Distance  (Feet): 100 Feet Assistive device: Rolling walker (2 wheeled) Gait Pattern/deviations: Step-through pattern;Decreased stride length;Trunk flexed Gait velocity: Decreased Gait velocity interpretation: Below normal speed for age/gender General Gait Details: Pt maanging well with the RW. Min guard initially progressing to supervision for safety.   Stairs            Wheelchair Mobility    Modified Rankin (Stroke Patients Only)       Balance Overall balance assessment: Needs assistance Sitting-balance support: Feet supported;No upper extremity supported Sitting balance-Leahy Scale: Good     Standing balance support: No upper extremity supported;During functional activity Standing balance-Leahy Scale: Fair Standing balance comment: with static standing activity                             Pertinent Vitals/Pain Pain Assessment: Faces Faces Pain Scale: Hurts a little bit Pain Descriptors / Indicators: Headache    Home Living Family/patient expects to be discharged to:: Private residence Living Arrangements: Spouse/significant other Available Help at Discharge: Family;Available PRN/intermittently Type of Home: Apartment Home Access: Level entry     Home Layout: One level Home Equipment: Shower seat;Cane - single point (Service dog)      Prior Function Level of Independence: Independent with assistive device(s)         Comments: Used a cane for walking.      Hand Dominance   Dominant Hand: Left    Extremity/Trunk Assessment   Upper Extremity Assessment: Defer to OT evaluation  Lower Extremity Assessment: RLE deficits/detail RLE Deficits / Details: Weakness consistent with prior stroke.     Cervical / Trunk Assessment: Normal  Communication   Communication: No difficulties  Cognition Arousal/Alertness: Awake/alert Behavior During Therapy: Anxious Overall Cognitive Status: Within Functional Limits for tasks assessed                       General Comments      Exercises     Assessment/Plan    PT Assessment Patient needs continued PT services  PT Problem List Decreased strength;Decreased range of motion;Decreased balance;Decreased activity tolerance;Decreased mobility;Decreased knowledge of use of DME;Decreased safety awareness;Decreased knowledge of precautions;Pain          PT Treatment Interventions DME instruction;Gait training;Stair training;Functional mobility training;Therapeutic activities;Therapeutic exercise;Neuromuscular re-education;Patient/family education    PT Goals (Current goals can be found in the Care Plan section)  Acute Rehab PT Goals PT Goal Formulation: With patient Time For Goal Achievement: 10/15/16 Potential to Achieve Goals: Good    Frequency Min 3X/week   Barriers to discharge        Co-evaluation               End of Session Equipment Utilized During Treatment: Gait belt;Other (comment) (R LE brace) Activity Tolerance: Patient tolerated treatment well Patient left: in chair;with call bell/phone within reach Nurse Communication: Mobility status    Functional Assessment Tool Used: Clinical judgement Functional Limitation: Mobility: Walking and moving around Mobility: Walking and Moving Around Current Status [id number]): At least 1 percent but less than 20 percent impaired, limited or restricted Mobility: Walking and Moving Around Goal Status [phone]): At least 1 percent but less than 20 percent impaired, limited or restricted    Time: 1135-1200 PT Time Calculation (min) (ACUTE ONLY): 25 min   Charges:   PT Evaluation $PT Eval Moderate Complexity: 1 Procedure PT Treatments $Gait Training: 8-22 mins   PT G Codes:   PT G-Codes **NOT FOR INPATIENT CLASS** Functional Assessment Tool Used: Clinical judgement Functional Limitation: Mobility: Walking and moving around Mobility: Walking and Moving Around Current Status [id number]): At least 1 percent but less  than 20 percent impaired, limited or restricted Mobility: Walking and Moving Around Goal Status [phone]): At least 1 percent but less than 20 percent impaired, limited or restricted    Thelma Comp 10/08/2016, 3:01 PM  Rolinda Roan, PT, DPT Acute Rehabilitation Services Pager: [phone]

## 2016-10-08 NOTE — Care Management Obs Status (Signed)
[place] NOTIFICATION   Patient Details  Name: Richard Fernandez MRN: [id number] Date of Birth: 1941-05-09   Medicare Observation Status Notification Given:  Yes    Pollie Friar, RN 10/08/2016, 12:59 PM

## 2016-10-08 NOTE — Consult Note (Signed)
[facility] Consult   Reason for Consult:  PTSD and anxiety Referring Physician: Dr. Sloan Leiter Patient Identification: Jama Krichbaum MRN:  [id number] Principal Diagnosis: Depression Diagnosis:   Patient Active Problem List   Diagnosis Date Noted  . Left-sided weakness [R53.1] 10/07/2016  . Weakness of left arm [R29.898] 10/07/2016  . PTSD (post-traumatic stress disorder) [F43.10]   . Bipolar I disorder ([place]) [F31.9]   . History of stroke [Z86.73]   . Morbid obesity due to excess calories ([place]) [E66.01] 12/27/2015  . OSA on CPAP [G47.33, Z99.89] 12/27/2015  . Memory difficulty [R41.3] 12/27/2015  . Obesity (BMI 30-39.9) [E66.9] 10/18/2013  . Hyperlipidemia [E78.5] 11/29/2010  . Depression [F32.9] 11/29/2010  . Obstructive sleep apnea [G47.33] 11/29/2010  . CEREBROVASCULAR ACCIDENT, HX OF [Z86.79] 11/29/2010    Total Time spent with patient: 45 minutes  Subjective:   Aayan Haskew is a 75 y.o. male patient admitted with Left-sided weakness.  HPI:  Elchanan Bob is a 75 y.o. male with medical history significant for CVA, depression, obstructive sleep apnea presents to the emergency department with chief complaint of left-sided weakness. Patient neurological examination ruled out cardiovascular accident. Patient complaining about being anxious, worried and stressed about losing the job about 3 weeks ago and not able to find a new job, reportedly being interviewed in several places. Patient also reportedly has increased shaking on his left hand which seems to be associated with anxiety. Patient reportedly diagnosed with the posttraumatic stress disorder since he has been involved with [place] [employer]. Patient denies active symptoms of posttraumatic stress disorder. Patient reportedly does not like to be involved with the [place] system. Most of his medication will be received from the primary care physician at [facility]. Patient has been taking Prozac 30 mg daily and also takes  BuSpar 7.5 mg twice daily. Patient has been seeing neurologist at [facility] noted as Associates and reportedly likes working with them. Patient has denied active symptoms of depression, mania, psychosis and also denied suicidal/homicidal ideation, intention or plans. Patient has plans to go back to work as soon as he gets a job. Spoke with the patient wife who is a Social worker who is also very supportive to him.  Past Psychiatric History: History of PTSD, has been receiving medication management from primary care physician and has no psychiatric services.  Risk to Self: Is patient at risk for suicide?: No Risk to Others:   Prior Inpatient Therapy:   Prior Outpatient Therapy:    Past Medical History:  Past Medical History:  Diagnosis Date  . CEREBROVASCULAR ACCIDENT, HX OF 11/29/2010  . DEPRESSION 11/29/2010  . Hearing loss   . HYPERLIPIDEMIA 11/29/2010  . OBSTRUCTIVE SLEEP APNEA 11/29/2010  . PTSD (post-traumatic stress disorder)   . Stroke [facility])     Past Surgical History:  Procedure Laterality Date  . [place]  . HAND SURGERY    . KNEE SURGERY    . SHOULDER ARTHROSCOPY WITH ROTATOR CUFF REPAIR     Family History:  Family History  Problem Relation Age of Onset  . Adopted: Yes  . Breast cancer Mother   . Heart failure Father    Family Psychiatric  History: Noncontributory Social History:  History  Alcohol Use No     History  Drug Use No    Social History   Social History  . Marital status: Married    Spouse name: [name]   . Number of children: 1  . Years of education: college   Social  History Main Topics  . Smoking status: Former Smoker    Packs/day: 1.50    Years: 20.00    Types: Cigarettes    Quit date: 11/04/1988  . Smokeless tobacco: Never Used  . Alcohol use No  . Drug use: No  . Sexual activity: Not Asked   Other Topics Concern  . None   Social History Narrative   Pts is adopted. Patient lives at home with his [name].   Retired but still working  some.   Education some college.   Left handed.   Caffeine four cups of coffee daily.         Additional Social History:    Allergies:  No Known Allergies  Labs:  Results for orders placed or performed during the hospital encounter of 10/07/16 (from the past 48 hour(s))  Protime-INR     Status: None   Collection Time: 10/07/16 10:28 AM  Result Value Ref Range   Prothrombin Time 13.3 11.4 - 15.2 seconds   INR 1.01   APTT     Status: None   Collection Time: 10/07/16 10:28 AM  Result Value Ref Range   aPTT 25 24 - 36 seconds  CBC     Status: None   Collection Time: 10/07/16 10:28 AM  Result Value Ref Range   WBC 4.0 4.0 - 10.5 K/uL   RBC 4.22 4.22 - 5.81 MIL/uL   Hemoglobin 14.2 13.0 - 17.0 g/dL   HCT 41.5 39.0 - 52.0 %   MCV 98.3 78.0 - 100.0 fL   MCH 33.6 26.0 - 34.0 pg   MCHC 34.2 30.0 - 36.0 g/dL   RDW 13.9 11.5 - 15.5 %   Platelets 205 150 - 400 K/uL  Differential     Status: None   Collection Time: 10/07/16 10:28 AM  Result Value Ref Range   Neutrophils Relative % 69 %   Lymphocytes Relative 23 %   Monocytes Relative 6 %   Eosinophils Relative 1 %   Basophils Relative 1 %   Band Neutrophils 0 %   Metamyelocytes Relative 0 %   Myelocytes 0 %   Promyelocytes Absolute 0 %   Blasts 0 %   nRBC 0 0 /100 WBC   Other 0 %   Neutro Abs 2.9 1.7 - 7.7 K/uL   Lymphs Abs 0.9 0.7 - 4.0 K/uL   Monocytes Absolute 0.2 0.1 - 1.0 K/uL   Eosinophils Absolute 0.0 0.0 - 0.7 K/uL   Basophils Absolute 0.0 0.0 - 0.1 K/uL   WBC Morphology ATYPICAL LYMPHOCYTES   Comprehensive metabolic panel     Status: Abnormal   Collection Time: 10/07/16 10:28 AM  Result Value Ref Range   Sodium 136 135 - 145 mmol/L   Potassium 4.0 3.5 - 5.1 mmol/L   Chloride 102 101 - 111 mmol/L   CO2 26 22 - 32 mmol/L   Glucose, Bld 112 (H) 65 - 99 mg/dL   BUN 9 6 - 20 mg/dL   Creatinine, Ser 0.69 0.61 - 1.24 mg/dL   Calcium 8.8 (L) 8.9 - 10.3 mg/dL   Total Protein 6.5 6.5 - 8.1 g/dL   Albumin 3.6 3.5 -  5.0 g/dL   AST 20 15 - 41 U/L   ALT 21 17 - 63 U/L   Alkaline Phosphatase 52 38 - 126 U/L   Total Bilirubin 0.8 0.3 - 1.2 mg/dL   GFR calc non Af Amer >60 >60 mL/min   GFR calc Af Amer >60 >60 mL/min  Comment: (NOTE) The eGFR has been calculated using the CKD EPI equation. This calculation has not been validated in all clinical situations. eGFR's persistently <60 mL/min signify possible Chronic Kidney Disease.    Anion gap 8 5 - 15  I-stat troponin, ED     Status: None   Collection Time: 10/07/16 10:31 AM  Result Value Ref Range   Troponin i, poc 0.00 0.00 - 0.08 ng/mL   Comment 3            Comment: Due to the release kinetics of cTnI, a negative result within the first hours of the onset of symptoms does not rule out myocardial infarction with certainty. If myocardial infarction is still suspected, repeat the test at appropriate intervals.   I-Stat Chem 8, ED     Status: Abnormal   Collection Time: 10/07/16 10:33 AM  Result Value Ref Range   Sodium 136 135 - 145 mmol/L   Potassium 4.0 3.5 - 5.1 mmol/L   Chloride 98 (L) 101 - 111 mmol/L   BUN 10 6 - 20 mg/dL   Creatinine, Ser 0.70 0.61 - 1.24 mg/dL   Glucose, Bld 110 (H) 65 - 99 mg/dL   Calcium, Ion 1.07 (L) 1.15 - 1.40 mmol/L   TCO2 27 0 - 100 mmol/L   Hemoglobin 14.3 13.0 - 17.0 g/dL   HCT 42.0 39.0 - 74.8 %  Basic metabolic panel     Status: Abnormal   Collection Time: 10/08/16  2:24 AM  Result Value Ref Range   Sodium 133 (L) 135 - 145 mmol/L   Potassium 3.8 3.5 - 5.1 mmol/L   Chloride 101 101 - 111 mmol/L   CO2 26 22 - 32 mmol/L   Glucose, Bld 93 65 - 99 mg/dL   BUN 14 6 - 20 mg/dL   Creatinine, Ser 0.68 0.61 - 1.24 mg/dL   Calcium 8.3 (L) 8.9 - 10.3 mg/dL   GFR calc non Af Amer >60 >60 mL/min   GFR calc Af Amer >60 >60 mL/min    Comment: (NOTE) The eGFR has been calculated using the CKD EPI equation. This calculation has not been validated in all clinical situations. eGFR's persistently <60 mL/min  signify possible Chronic Kidney Disease.    Anion gap 6 5 - 15  CBC     Status: Abnormal   Collection Time: 10/08/16  2:24 AM  Result Value Ref Range   WBC 4.7 4.0 - 10.5 K/uL   RBC 3.82 (L) 4.22 - 5.81 MIL/uL   Hemoglobin 12.8 (L) 13.0 - 17.0 g/dL   HCT 37.8 (L) 39.0 - 52.0 %   MCV 99.0 78.0 - 100.0 fL   MCH 33.5 26.0 - 34.0 pg   MCHC 33.9 30.0 - 36.0 g/dL   RDW 13.9 11.5 - 15.5 %   Platelets 190 150 - 400 K/uL  Troponin I     Status: None   Collection Time: 10/08/16 10:13 AM  Result Value Ref Range   Troponin I <0.03 <0.03 ng/mL    Current Facility-Administered Medications  Medication Dose Route Frequency Provider Last Rate Last Dose  . acetaminophen (TYLENOL) tablet 650 mg  650 mg Oral Q6H PRN Radene Gunning, NP   650 mg at 10/08/16 2707   Or  . acetaminophen (TYLENOL) suppository 650 mg  650 mg Rectal Q6H PRN Radene Gunning, NP      . artificial tears (LACRILUBE) ophthalmic ointment   Both Eyes Daily PRN Radene Gunning, NP      .  busPIRone (BUSPAR) tablet 7.5 mg  7.5 mg Oral BID Lezlie Octave Black, NP   7.5 mg at 10/08/16 1052  . clopidogrel (PLAVIX) tablet 75 mg  75 mg Oral Daily Radene Gunning, NP   75 mg at 10/08/16 1051  . FLUoxetine (PROZAC) capsule 30 mg  30 mg Oral Daily Lezlie Octave Black, NP   30 mg at 10/08/16 1050  . gi cocktail (Maalox,Lidocaine,Donnatal)  30 mL Oral TID PRN Jonetta Osgood, MD      . ondansetron [facility]) tablet 4 mg  4 mg Oral Q6H PRN Radene Gunning, NP       Or  . ondansetron [facility]) injection 4 mg  4 mg Intravenous Q6H PRN Radene Gunning, NP      . pantoprazole (PROTONIX) EC tablet 40 mg  40 mg Oral Q1200 Jonetta Osgood, MD   40 mg at 10/08/16 1313  . simvastatin (ZOCOR) tablet 40 mg  40 mg Oral q1800 Radene Gunning, NP   40 mg at 10/07/16 2005  . sodium chloride flush (NS) 0.9 % injection 3 mL  3 mL Intravenous Q12H Radene Gunning, NP   3 mL at 10/07/16 2227    Musculoskeletal: Strength & Muscle Tone: within normal limits Gait & Station:  unsteady Patient leans: Able to walk to the bathroom with a walker as suggested by the staff  Psychiatric Specialty Exam: Physical Exam as per history and physical   ROS left hand shake secondary to increased anxiety and worries and also right hand mild restlessness which seems to be chronic in nature.  No Fever-chills, No Headache, No changes with Vision or hearing, reports vertigo No problems swallowing food or Liquids, No Chest pain, Cough or Shortness of Breath, No Abdominal pain, No Nausea or Vommitting, Bowel movements are regular, No Blood in stool or Urine, No dysuria, No new skin rashes or bruises, No new joints pains-aches,  No new weakness, tingling, numbness in any extremity, No recent weight gain or loss, No polyuria, polydypsia or polyphagia,   A full 10 point Review of Systems was done, except as stated above, all other Review of Systems were negative.  Blood pressure 110/66, pulse (!) 54, temperature 97.6 F (36.4 C), temperature source Oral, resp. rate 16, weight 105.2 kg (231 lb 14.4 oz), SpO2 97 %.Body mass index is 35.26 kg/m.  General Appearance: Casual  Eye Contact:  Good  Speech:  Clear and Coherent  Volume:  Normal  Mood:  Anxious  Affect:  Appropriate and Congruent  Thought Process:  Coherent and Goal Directed  Orientation:  Full (Time, Place, and Person)  Thought Content:  WDL  Suicidal Thoughts:  No  Homicidal Thoughts:  No  Memory:  Immediate;   Good Recent;   Fair Remote;   Fair  Judgement:  Intact  Insight:  Fair  Psychomotor Activity:  Decreased  Concentration:  Concentration: Fair and Attention Span: Fair  Recall:  Good  Fund of Knowledge:  Good  Language:  Good  Akathisia:  No  Handed:  Right  AIMS (if indicated):     Assets:  Communication Skills Desire for Improvement Financial Resources/Insurance Housing Intimacy Leisure Time Resilience Social Support Transportation  ADL's:  Intact  Cognition:  WNL  Sleep:         Treatment Plan Summary:  Patient has been anxious and stressed about recent loss of job and unable to get into a new job Patient is willing to start a new medication to control her  anxiety and left hand shaking We start gabapentin 100 mg twice daily, patient was taken up to 600 mg in the past which made him more tired and drowsy so try to keep the area dose of the gabapentin in the near future We start Inderal LA 60 mg daily for controlling hand tremors and leg tremor, patient has no QTC prolongation and need to be monitored for the bradycardia Case discussed with the Dr. Sloan Leiter Daily contact with patient to assess and evaluate symptoms and progress in treatment and Medication management  Appreciate psychiatric consultation and we sign off as of today Please contact [phone] or [phone] if needs further assistance   Disposition: No evidence of imminent risk to self or others at present.   Patient does not meet criteria for psychiatric inpatient admission. Supportive therapy provided about ongoing stressors.  Ambrose Finland, MD 10/08/2016 1:57 PM

## 2016-10-09 ENCOUNTER — Encounter (HOSPITAL_COMMUNITY): Payer: Self-pay | Admitting: *Deleted

## 2016-10-09 DIAGNOSIS — F319 Bipolar disorder, unspecified: Secondary | ICD-10-CM | POA: Diagnosis not present

## 2016-10-09 DIAGNOSIS — F320 Major depressive disorder, single episode, mild: Secondary | ICD-10-CM | POA: Diagnosis not present

## 2016-10-09 DIAGNOSIS — G4733 Obstructive sleep apnea (adult) (pediatric): Secondary | ICD-10-CM | POA: Diagnosis not present

## 2016-10-09 DIAGNOSIS — R29898 Other symptoms and signs involving the musculoskeletal system: Secondary | ICD-10-CM | POA: Diagnosis not present

## 2016-10-09 DIAGNOSIS — R531 Weakness: Secondary | ICD-10-CM | POA: Diagnosis not present

## 2016-10-09 MED ORDER — GABAPENTIN 100 MG PO CAPS: 100.0000 mg | ORAL_CAPSULE | Freq: Two times a day (BID) | ORAL | 0 refills | Status: DC

## 2016-10-09 NOTE — Progress Notes (Signed)
Patient discharge home with wife. Discharge instructions were reviewed with patient, and wife. Patient verbalized understanding.

## 2016-10-09 NOTE — Progress Notes (Signed)
Physical Therapy Treatment Patient Details Name: Richard Fernandez MRN: [id number] DOB: 1941/04/28 Today's Date: 10/09/2016    History of Present Illness Pt is a 75 y/o male who presents with tremor in L hand/arm and L sided weakness. CT negative for acute abnormalities and per chart review, symptoms may have a psychogenic component.    PT Comments    Pt progressing towards physical therapy goals. Was able to perform ambulation with [facility] today and showed increased independence with mobility overall. Continue to recommend HHPT to follow up at d/c and pt/wife are agreeable. Will continue to follow and progress as able per POC.   Follow Up Recommendations  Home health PT;Supervision for mobility/OOB     Equipment Recommendations  None recommended by PT    Recommendations for Other Services       Precautions / Restrictions Precautions Precautions: Fall Precaution Comments: Pt reports he has not fallen in 3 months. Previous stroke with R sided residual weakness - wears a full leg brace on the R side.  Restrictions Weight Bearing Restrictions: No    Mobility  Bed Mobility Overal bed mobility: Needs Assistance Bed Mobility: Supine to Sit     Supine to sit: Supervision     General bed mobility comments: No assist required.   Transfers Overall transfer level: Needs assistance Equipment used: Straight cane Transfers: Sit to/from Stand Sit to Stand: Min guard         General transfer comment: Close guard for safety. Pt was able to power up to full standing without assist.   Ambulation/Gait Ambulation/Gait assistance: Min guard;Supervision Ambulation Distance (Feet): 200 Feet Assistive device: Straight cane Gait Pattern/deviations: Step-through pattern;Decreased stride length;Decreased dorsiflexion - right;Decreased dorsiflexion - left Gait velocity: Decreased Gait velocity interpretation: Below normal speed for age/gender General Gait Details: Attempted with the [facility] today. Pt  was able to ambulate an improved distance without noted fatigue. Pt with decreased floor clearance/heel strike bilaterally but pt was able to make corrective changes with cues.    Stairs Stairs:  (Deferred as pt does not have to negotiate stairs at home)          Wheelchair Mobility    Modified Rankin (Stroke Patients Only)       Balance Overall balance assessment: Needs assistance Sitting-balance support: Feet supported;No upper extremity supported Sitting balance-Leahy Scale: Good     Standing balance support: No upper extremity supported Standing balance-Leahy Scale: Fair                      Cognition Arousal/Alertness: Awake/alert Behavior During Therapy: WFL for tasks assessed/performed Overall Cognitive Status: Within Functional Limits for tasks assessed                      Exercises      General Comments        Pertinent Vitals/Pain Pain Assessment: No/denies pain    Home Living                      Prior Function            PT Goals (current goals can now be found in the care plan section) Acute Rehab PT Goals Patient Stated Goal: Home today PT Goal Formulation: With patient Time For Goal Achievement: 10/15/16 Potential to Achieve Goals: Good Progress towards PT goals: Progressing toward goals    Frequency    Min 3X/week      PT Plan Current plan remains appropriate  Co-evaluation             End of Session Equipment Utilized During Treatment: Gait belt;Other (comment) (R LE brace) Activity Tolerance: Patient tolerated treatment well Patient left: in chair;with call bell/phone within reach     Time: 0902-0928 PT Time Calculation (min) (ACUTE ONLY): 26 min  Charges:  $Gait Training: 23-37 mins                    G Codes:      Thelma Comp Oct 23, 2016, 11:44 AM   Rolinda Roan, PT, DPT Acute Rehabilitation Services Pager: [phone]

## 2016-10-09 NOTE — Progress Notes (Deleted)
Pt. was found on CPAP with Nasal mask upon my arrival, on room air-96%, humidity filled, tolerating well.
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
## 2016-10-10 ENCOUNTER — Telehealth: Payer: Self-pay | Admitting: Family Medicine

## 2016-10-10 DIAGNOSIS — F4312 Post-traumatic stress disorder, chronic: Secondary | ICD-10-CM | POA: Diagnosis not present

## 2016-10-10 DIAGNOSIS — I69322 Dysarthria following cerebral infarction: Secondary | ICD-10-CM | POA: Diagnosis not present

## 2016-10-10 DIAGNOSIS — G4733 Obstructive sleep apnea (adult) (pediatric): Secondary | ICD-10-CM | POA: Diagnosis not present

## 2016-10-10 DIAGNOSIS — I69352 Hemiplegia and hemiparesis following cerebral infarction affecting left dominant side: Secondary | ICD-10-CM | POA: Diagnosis not present

## 2016-10-10 DIAGNOSIS — G64 Other disorders of peripheral nervous system: Secondary | ICD-10-CM | POA: Diagnosis not present

## 2016-10-10 DIAGNOSIS — I69353 Hemiplegia and hemiparesis following cerebral infarction affecting right non-dominant side: Secondary | ICD-10-CM | POA: Diagnosis not present

## 2016-10-10 DIAGNOSIS — F419 Anxiety disorder, unspecified: Secondary | ICD-10-CM | POA: Diagnosis not present

## 2016-10-10 DIAGNOSIS — I69398 Other sequelae of cerebral infarction: Secondary | ICD-10-CM | POA: Diagnosis not present

## 2016-10-10 DIAGNOSIS — F329 Major depressive disorder, single episode, unspecified: Secondary | ICD-10-CM | POA: Diagnosis not present

## 2016-10-10 NOTE — Telephone Encounter (Signed)
Verbal order for speech therapy and OT.  Pt states the he had a little stutter before his episode but it has gotten worse.

## 2016-10-10 NOTE — Telephone Encounter (Signed)
OK 

## 2016-10-11 ENCOUNTER — Telehealth: Payer: Self-pay

## 2016-10-11 NOTE — Telephone Encounter (Signed)
Left verbal okay for orders on VM.

## 2016-10-11 NOTE — Telephone Encounter (Signed)
D/C 10/09/16 To: home

## 2016-10-11 NOTE — Telephone Encounter (Signed)
Spoke with pt and he states that he is doing well. Pt states that he is improving daily. Also spoke with pt's wife and she had some questions about his medications. Those were all answered.   Appt scheduled with Dr Elease Hashimoto 10/14/16.   Transition Care Management Follow-up Telephone Call  How have you been since you were released from the hospital? Very well   Do you understand why you were in the hospital? yes   Do you understand the discharge instructions? yes  Items Reviewed:  Medications reviewed: yes  Allergies reviewed: yes  Dietary changes reviewed: yes  Referrals reviewed: yes   Functional Questionnaire:   Activities of Daily Living (ADLs):   He states they are independent in the following: ambulation, bathing and hygiene, feeding, continence, grooming, toileting and dressing States they require assistance with the following: none   Any transportation issues/concerns?: yes   Any patient concerns? yes   Confirmed importance and date/time of follow-up visits scheduled: yes   Confirmed with patient if condition begins to worsen call PCP or go to the ER.  Patient was given the Call-a-Nurse line [phone]: yes

## 2016-10-14 ENCOUNTER — Encounter: Payer: Self-pay | Admitting: Family Medicine

## 2016-10-14 ENCOUNTER — Ambulatory Visit (INDEPENDENT_AMBULATORY_CARE_PROVIDER_SITE_OTHER): Payer: Commercial Managed Care - HMO | Admitting: Family Medicine

## 2016-10-14 VITALS — BP 110/60 | HR 73 | Temp 97.6°F | Ht 68.0 in | Wt 231.8 lb

## 2016-10-14 DIAGNOSIS — D649 Anemia, unspecified: Secondary | ICD-10-CM | POA: Diagnosis not present

## 2016-10-14 DIAGNOSIS — E7849 Other hyperlipidemia: Secondary | ICD-10-CM

## 2016-10-14 DIAGNOSIS — I69398 Other sequelae of cerebral infarction: Secondary | ICD-10-CM | POA: Diagnosis not present

## 2016-10-14 DIAGNOSIS — I69352 Hemiplegia and hemiparesis following cerebral infarction affecting left dominant side: Secondary | ICD-10-CM | POA: Diagnosis not present

## 2016-10-14 DIAGNOSIS — G64 Other disorders of peripheral nervous system: Secondary | ICD-10-CM | POA: Diagnosis not present

## 2016-10-14 DIAGNOSIS — R531 Weakness: Secondary | ICD-10-CM

## 2016-10-14 DIAGNOSIS — F4312 Post-traumatic stress disorder, chronic: Secondary | ICD-10-CM | POA: Diagnosis not present

## 2016-10-14 DIAGNOSIS — F330 Major depressive disorder, recurrent, mild: Secondary | ICD-10-CM

## 2016-10-14 DIAGNOSIS — I69353 Hemiplegia and hemiparesis following cerebral infarction affecting right non-dominant side: Secondary | ICD-10-CM | POA: Diagnosis not present

## 2016-10-14 DIAGNOSIS — F419 Anxiety disorder, unspecified: Secondary | ICD-10-CM | POA: Diagnosis not present

## 2016-10-14 DIAGNOSIS — E784 Other hyperlipidemia: Secondary | ICD-10-CM

## 2016-10-14 DIAGNOSIS — E871 Hypo-osmolality and hyponatremia: Secondary | ICD-10-CM | POA: Diagnosis not present

## 2016-10-14 DIAGNOSIS — G4733 Obstructive sleep apnea (adult) (pediatric): Secondary | ICD-10-CM | POA: Diagnosis not present

## 2016-10-14 DIAGNOSIS — F329 Major depressive disorder, single episode, unspecified: Secondary | ICD-10-CM | POA: Diagnosis not present

## 2016-10-14 DIAGNOSIS — I69322 Dysarthria following cerebral infarction: Secondary | ICD-10-CM | POA: Diagnosis not present

## 2016-10-14 LAB — CBC WITH DIFFERENTIAL/PLATELET
Basophils Absolute: 0 K/uL (ref 0.0–0.1)
Basophils Relative: 0.7 % (ref 0.0–3.0)
Eosinophils Absolute: 0.1 K/uL (ref 0.0–0.7)
Eosinophils Relative: 1.3 % (ref 0.0–5.0)
HCT: 39.5 % (ref 39.0–52.0)
Hemoglobin: 13.6 g/dL (ref 13.0–17.0)
Lymphocytes Relative: 29.3 % (ref 12.0–46.0)
Lymphs Abs: 1.3 K/uL (ref 0.7–4.0)
MCHC: 34.5 g/dL (ref 30.0–36.0)
MCV: 99.1 fl (ref 78.0–100.0)
Monocytes Absolute: 0.5 K/uL (ref 0.1–1.0)
Monocytes Relative: 10.5 % (ref 3.0–12.0)
Neutro Abs: 2.5 K/uL (ref 1.4–7.7)
Neutrophils Relative %: 58.2 % (ref 43.0–77.0)
Platelets: 218 K/uL (ref 150.0–400.0)
RBC: 3.98 Mil/uL — ABNORMAL LOW (ref 4.22–5.81)
RDW: 14.3 % (ref 11.5–15.5)
WBC: 4.4 K/uL (ref 4.0–10.5)

## 2016-10-14 LAB — BASIC METABOLIC PANEL WITH GFR
BUN: 12 mg/dL (ref 6–23)
CO2: 28 meq/L (ref 19–32)
Calcium: 8.9 mg/dL (ref 8.4–10.5)
Chloride: 100 meq/L (ref 96–112)
Creatinine, Ser: 0.65 mg/dL (ref 0.40–1.50)
GFR: 127.11 mL/min
Glucose, Bld: 108 mg/dL — ABNORMAL HIGH (ref 70–99)
Potassium: 4.2 meq/L (ref 3.5–5.1)
Sodium: 136 meq/L (ref 135–145)

## 2016-10-14 MED ORDER — FLUOXETINE HCL 20 MG PO CAPS: 20.0000 mg | ORAL_CAPSULE | Freq: Every day | ORAL | 3 refills | Status: DC
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
## 2016-10-14 NOTE — Progress Notes (Signed)
Pre visit review using our clinic review tool, if applicable. No additional management support is needed unless otherwise documented below in the visit note. 

## 2016-10-16 DIAGNOSIS — F419 Anxiety disorder, unspecified: Secondary | ICD-10-CM | POA: Diagnosis not present

## 2016-10-16 DIAGNOSIS — F4312 Post-traumatic stress disorder, chronic: Secondary | ICD-10-CM | POA: Diagnosis not present

## 2016-10-16 DIAGNOSIS — F329 Major depressive disorder, single episode, unspecified: Secondary | ICD-10-CM | POA: Diagnosis not present

## 2016-10-16 DIAGNOSIS — I69322 Dysarthria following cerebral infarction: Secondary | ICD-10-CM | POA: Diagnosis not present

## 2016-10-16 DIAGNOSIS — I69398 Other sequelae of cerebral infarction: Secondary | ICD-10-CM | POA: Diagnosis not present

## 2016-10-16 DIAGNOSIS — I69352 Hemiplegia and hemiparesis following cerebral infarction affecting left dominant side: Secondary | ICD-10-CM | POA: Diagnosis not present

## 2016-10-16 DIAGNOSIS — G64 Other disorders of peripheral nervous system: Secondary | ICD-10-CM | POA: Diagnosis not present

## 2016-10-16 DIAGNOSIS — I69353 Hemiplegia and hemiparesis following cerebral infarction affecting right non-dominant side: Secondary | ICD-10-CM | POA: Diagnosis not present

## 2016-10-16 DIAGNOSIS — G4733 Obstructive sleep apnea (adult) (pediatric): Secondary | ICD-10-CM | POA: Diagnosis not present

## 2016-10-17 ENCOUNTER — Telehealth: Payer: Self-pay | Admitting: Family Medicine

## 2016-10-17 DIAGNOSIS — I69353 Hemiplegia and hemiparesis following cerebral infarction affecting right non-dominant side: Secondary | ICD-10-CM | POA: Diagnosis not present

## 2016-10-17 DIAGNOSIS — I69352 Hemiplegia and hemiparesis following cerebral infarction affecting left dominant side: Secondary | ICD-10-CM | POA: Diagnosis not present

## 2016-10-17 DIAGNOSIS — F419 Anxiety disorder, unspecified: Secondary | ICD-10-CM | POA: Diagnosis not present

## 2016-10-17 DIAGNOSIS — I69322 Dysarthria following cerebral infarction: Secondary | ICD-10-CM | POA: Diagnosis not present

## 2016-10-17 DIAGNOSIS — F329 Major depressive disorder, single episode, unspecified: Secondary | ICD-10-CM | POA: Diagnosis not present

## 2016-10-17 DIAGNOSIS — G4733 Obstructive sleep apnea (adult) (pediatric): Secondary | ICD-10-CM | POA: Diagnosis not present

## 2016-10-17 DIAGNOSIS — I69398 Other sequelae of cerebral infarction: Secondary | ICD-10-CM | POA: Diagnosis not present

## 2016-10-17 DIAGNOSIS — F4312 Post-traumatic stress disorder, chronic: Secondary | ICD-10-CM | POA: Diagnosis not present

## 2016-10-17 DIAGNOSIS — G64 Other disorders of peripheral nervous system: Secondary | ICD-10-CM | POA: Diagnosis not present

## 2016-10-17 NOTE — Telephone Encounter (Signed)
Evaluation was completed and Speech Therapist would like to have verbal orders 1 every other week 2 to follow up with the visit.

## 2016-10-17 NOTE — Telephone Encounter (Signed)
Okay for verbal orders. 

## 2016-10-18 DIAGNOSIS — F419 Anxiety disorder, unspecified: Secondary | ICD-10-CM | POA: Diagnosis not present

## 2016-10-18 DIAGNOSIS — G4733 Obstructive sleep apnea (adult) (pediatric): Secondary | ICD-10-CM | POA: Diagnosis not present

## 2016-10-18 DIAGNOSIS — F329 Major depressive disorder, single episode, unspecified: Secondary | ICD-10-CM | POA: Diagnosis not present

## 2016-10-18 DIAGNOSIS — F4312 Post-traumatic stress disorder, chronic: Secondary | ICD-10-CM | POA: Diagnosis not present

## 2016-10-18 DIAGNOSIS — I69353 Hemiplegia and hemiparesis following cerebral infarction affecting right non-dominant side: Secondary | ICD-10-CM | POA: Diagnosis not present

## 2016-10-18 DIAGNOSIS — I69352 Hemiplegia and hemiparesis following cerebral infarction affecting left dominant side: Secondary | ICD-10-CM | POA: Diagnosis not present

## 2016-10-18 DIAGNOSIS — I69322 Dysarthria following cerebral infarction: Secondary | ICD-10-CM | POA: Diagnosis not present

## 2016-10-18 DIAGNOSIS — I69398 Other sequelae of cerebral infarction: Secondary | ICD-10-CM | POA: Diagnosis not present

## 2016-10-18 DIAGNOSIS — G64 Other disorders of peripheral nervous system: Secondary | ICD-10-CM | POA: Diagnosis not present

## 2016-10-18 NOTE — Telephone Encounter (Signed)
OK 

## 2016-10-18 NOTE — Telephone Encounter (Signed)
Called and left voicemail on [employer] personal and confidential voicemail giving verbal orders per Dr. Elease Hashimoto. Okay for Speech therapy.   If any further assistance is needed left office number on voicemail.

## 2016-10-21 ENCOUNTER — Telehealth: Payer: Self-pay | Admitting: Family Medicine

## 2016-10-21 NOTE — Telephone Encounter (Signed)
Manuela Schwartz with [facility] would like additional home health OT orders to see pt  2 wk /2  Manuela Schwartz states this is a confidential VM and ok to leave message

## 2016-10-21 NOTE — Telephone Encounter (Signed)
Verbal orders given via voice-mail.

## 2016-10-21 NOTE — Telephone Encounter (Signed)
Please advise 

## 2016-10-21 NOTE — Telephone Encounter (Signed)
OK 

## 2016-10-22 ENCOUNTER — Telehealth: Payer: Self-pay | Admitting: Family Medicine

## 2016-10-22 DIAGNOSIS — I69353 Hemiplegia and hemiparesis following cerebral infarction affecting right non-dominant side: Secondary | ICD-10-CM | POA: Diagnosis not present

## 2016-10-22 DIAGNOSIS — F329 Major depressive disorder, single episode, unspecified: Secondary | ICD-10-CM | POA: Diagnosis not present

## 2016-10-22 DIAGNOSIS — F419 Anxiety disorder, unspecified: Secondary | ICD-10-CM | POA: Diagnosis not present

## 2016-10-22 DIAGNOSIS — G64 Other disorders of peripheral nervous system: Secondary | ICD-10-CM | POA: Diagnosis not present

## 2016-10-22 DIAGNOSIS — F4312 Post-traumatic stress disorder, chronic: Secondary | ICD-10-CM | POA: Diagnosis not present

## 2016-10-22 DIAGNOSIS — I69398 Other sequelae of cerebral infarction: Secondary | ICD-10-CM | POA: Diagnosis not present

## 2016-10-22 DIAGNOSIS — G4733 Obstructive sleep apnea (adult) (pediatric): Secondary | ICD-10-CM | POA: Diagnosis not present

## 2016-10-22 DIAGNOSIS — I69352 Hemiplegia and hemiparesis following cerebral infarction affecting left dominant side: Secondary | ICD-10-CM | POA: Diagnosis not present

## 2016-10-22 DIAGNOSIS — I69322 Dysarthria following cerebral infarction: Secondary | ICD-10-CM | POA: Diagnosis not present

## 2016-10-22 NOTE — Telephone Encounter (Signed)
Information has been refaxed to clinic.

## 2016-10-22 NOTE — Telephone Encounter (Signed)
Okay for written script?

## 2016-10-22 NOTE — Telephone Encounter (Signed)
Script has been faxed to [facility].

## 2016-10-22 NOTE — Telephone Encounter (Signed)
Richard Fernandez,PT at [facility] would like to see if Dr. Elease Hashimoto could fax a Rx for L ASO to [facility].  [name] was not able to provide [facility] fax number.  I was able to call the 1800# and they provided the local phone # for [facility] [phone].

## 2016-10-22 NOTE — Telephone Encounter (Signed)
Written.

## 2016-10-22 NOTE — Telephone Encounter (Signed)
Richard Fernandez with [facility] need to have a demographic that includes insurance, pt contact info and DOB.

## 2016-10-24 DIAGNOSIS — F419 Anxiety disorder, unspecified: Secondary | ICD-10-CM | POA: Diagnosis not present

## 2016-10-24 DIAGNOSIS — G64 Other disorders of peripheral nervous system: Secondary | ICD-10-CM | POA: Diagnosis not present

## 2016-10-24 DIAGNOSIS — F329 Major depressive disorder, single episode, unspecified: Secondary | ICD-10-CM | POA: Diagnosis not present

## 2016-10-24 DIAGNOSIS — I69398 Other sequelae of cerebral infarction: Secondary | ICD-10-CM | POA: Diagnosis not present

## 2016-10-24 DIAGNOSIS — F4312 Post-traumatic stress disorder, chronic: Secondary | ICD-10-CM | POA: Diagnosis not present

## 2016-10-24 DIAGNOSIS — I69353 Hemiplegia and hemiparesis following cerebral infarction affecting right non-dominant side: Secondary | ICD-10-CM | POA: Diagnosis not present

## 2016-10-24 DIAGNOSIS — I69352 Hemiplegia and hemiparesis following cerebral infarction affecting left dominant side: Secondary | ICD-10-CM | POA: Diagnosis not present

## 2016-10-24 DIAGNOSIS — I69322 Dysarthria following cerebral infarction: Secondary | ICD-10-CM | POA: Diagnosis not present

## 2016-10-24 DIAGNOSIS — G4733 Obstructive sleep apnea (adult) (pediatric): Secondary | ICD-10-CM | POA: Diagnosis not present

## 2016-10-29 DIAGNOSIS — I69398 Other sequelae of cerebral infarction: Secondary | ICD-10-CM | POA: Diagnosis not present

## 2016-10-29 DIAGNOSIS — F4312 Post-traumatic stress disorder, chronic: Secondary | ICD-10-CM | POA: Diagnosis not present

## 2016-10-29 DIAGNOSIS — F419 Anxiety disorder, unspecified: Secondary | ICD-10-CM | POA: Diagnosis not present

## 2016-10-29 DIAGNOSIS — G64 Other disorders of peripheral nervous system: Secondary | ICD-10-CM | POA: Diagnosis not present

## 2016-10-29 DIAGNOSIS — I69352 Hemiplegia and hemiparesis following cerebral infarction affecting left dominant side: Secondary | ICD-10-CM | POA: Diagnosis not present

## 2016-10-29 DIAGNOSIS — I69353 Hemiplegia and hemiparesis following cerebral infarction affecting right non-dominant side: Secondary | ICD-10-CM | POA: Diagnosis not present

## 2016-10-29 DIAGNOSIS — G4733 Obstructive sleep apnea (adult) (pediatric): Secondary | ICD-10-CM | POA: Diagnosis not present

## 2016-10-29 DIAGNOSIS — I69322 Dysarthria following cerebral infarction: Secondary | ICD-10-CM | POA: Diagnosis not present

## 2016-10-29 DIAGNOSIS — F329 Major depressive disorder, single episode, unspecified: Secondary | ICD-10-CM | POA: Diagnosis not present

## 2016-10-30 DIAGNOSIS — F4312 Post-traumatic stress disorder, chronic: Secondary | ICD-10-CM | POA: Diagnosis not present

## 2016-10-30 DIAGNOSIS — F329 Major depressive disorder, single episode, unspecified: Secondary | ICD-10-CM | POA: Diagnosis not present

## 2016-10-30 DIAGNOSIS — I69398 Other sequelae of cerebral infarction: Secondary | ICD-10-CM | POA: Diagnosis not present

## 2016-10-30 DIAGNOSIS — G4733 Obstructive sleep apnea (adult) (pediatric): Secondary | ICD-10-CM | POA: Diagnosis not present

## 2016-10-30 DIAGNOSIS — I69352 Hemiplegia and hemiparesis following cerebral infarction affecting left dominant side: Secondary | ICD-10-CM | POA: Diagnosis not present

## 2016-10-30 DIAGNOSIS — F419 Anxiety disorder, unspecified: Secondary | ICD-10-CM | POA: Diagnosis not present

## 2016-10-30 DIAGNOSIS — I69353 Hemiplegia and hemiparesis following cerebral infarction affecting right non-dominant side: Secondary | ICD-10-CM | POA: Diagnosis not present

## 2016-10-30 DIAGNOSIS — I69322 Dysarthria following cerebral infarction: Secondary | ICD-10-CM | POA: Diagnosis not present

## 2016-10-30 DIAGNOSIS — G64 Other disorders of peripheral nervous system: Secondary | ICD-10-CM | POA: Diagnosis not present

## 2016-10-31 ENCOUNTER — Telehealth: Payer: Self-pay | Admitting: Family Medicine

## 2016-10-31 DIAGNOSIS — F419 Anxiety disorder, unspecified: Secondary | ICD-10-CM | POA: Diagnosis not present

## 2016-10-31 DIAGNOSIS — G64 Other disorders of peripheral nervous system: Secondary | ICD-10-CM | POA: Diagnosis not present

## 2016-10-31 DIAGNOSIS — I69322 Dysarthria following cerebral infarction: Secondary | ICD-10-CM | POA: Diagnosis not present

## 2016-10-31 DIAGNOSIS — F4312 Post-traumatic stress disorder, chronic: Secondary | ICD-10-CM | POA: Diagnosis not present

## 2016-10-31 DIAGNOSIS — I69353 Hemiplegia and hemiparesis following cerebral infarction affecting right non-dominant side: Secondary | ICD-10-CM | POA: Diagnosis not present

## 2016-10-31 DIAGNOSIS — F329 Major depressive disorder, single episode, unspecified: Secondary | ICD-10-CM | POA: Diagnosis not present

## 2016-10-31 DIAGNOSIS — I69398 Other sequelae of cerebral infarction: Secondary | ICD-10-CM | POA: Diagnosis not present

## 2016-10-31 DIAGNOSIS — I69352 Hemiplegia and hemiparesis following cerebral infarction affecting left dominant side: Secondary | ICD-10-CM | POA: Diagnosis not present

## 2016-10-31 DIAGNOSIS — G4733 Obstructive sleep apnea (adult) (pediatric): Secondary | ICD-10-CM | POA: Diagnosis not present

## 2016-10-31 NOTE — Telephone Encounter (Signed)
Richard Fernandez is calling requesting verbal order for more PT once a wk for 1 wk and twice a wk for 1 wk starting after 11-13-16.

## 2016-10-31 NOTE — Telephone Encounter (Signed)
Okay for verbal orders. 

## 2016-11-01 DIAGNOSIS — G4733 Obstructive sleep apnea (adult) (pediatric): Secondary | ICD-10-CM | POA: Diagnosis not present

## 2016-11-01 DIAGNOSIS — G64 Other disorders of peripheral nervous system: Secondary | ICD-10-CM | POA: Diagnosis not present

## 2016-11-01 DIAGNOSIS — F4312 Post-traumatic stress disorder, chronic: Secondary | ICD-10-CM | POA: Diagnosis not present

## 2016-11-01 DIAGNOSIS — I69352 Hemiplegia and hemiparesis following cerebral infarction affecting left dominant side: Secondary | ICD-10-CM | POA: Diagnosis not present

## 2016-11-01 DIAGNOSIS — I69353 Hemiplegia and hemiparesis following cerebral infarction affecting right non-dominant side: Secondary | ICD-10-CM | POA: Diagnosis not present

## 2016-11-01 DIAGNOSIS — I69322 Dysarthria following cerebral infarction: Secondary | ICD-10-CM | POA: Diagnosis not present

## 2016-11-01 DIAGNOSIS — I69398 Other sequelae of cerebral infarction: Secondary | ICD-10-CM | POA: Diagnosis not present

## 2016-11-01 DIAGNOSIS — F419 Anxiety disorder, unspecified: Secondary | ICD-10-CM | POA: Diagnosis not present

## 2016-11-01 DIAGNOSIS — F329 Major depressive disorder, single episode, unspecified: Secondary | ICD-10-CM | POA: Diagnosis not present

## 2016-11-05 NOTE — Telephone Encounter (Signed)
OK 

## 2016-11-05 NOTE — Telephone Encounter (Signed)
Left a voicemail for [facility] regarding pt continuing PT

## 2016-11-13 DIAGNOSIS — M21372 Foot drop, left foot: Secondary | ICD-10-CM | POA: Diagnosis not present

## 2016-11-14 ENCOUNTER — Telehealth: Payer: Self-pay | Admitting: Family Medicine

## 2016-11-14 DIAGNOSIS — Z9181 History of falling: Secondary | ICD-10-CM | POA: Diagnosis not present

## 2016-11-14 DIAGNOSIS — I69353 Hemiplegia and hemiparesis following cerebral infarction affecting right non-dominant side: Secondary | ICD-10-CM | POA: Diagnosis not present

## 2016-11-14 DIAGNOSIS — E785 Hyperlipidemia, unspecified: Secondary | ICD-10-CM | POA: Diagnosis not present

## 2016-11-14 DIAGNOSIS — I69398 Other sequelae of cerebral infarction: Secondary | ICD-10-CM | POA: Diagnosis not present

## 2016-11-14 DIAGNOSIS — Z87891 Personal history of nicotine dependence: Secondary | ICD-10-CM | POA: Diagnosis not present

## 2016-11-14 DIAGNOSIS — I69322 Dysarthria following cerebral infarction: Secondary | ICD-10-CM | POA: Diagnosis not present

## 2016-11-14 DIAGNOSIS — Z7901 Long term (current) use of anticoagulants: Secondary | ICD-10-CM | POA: Diagnosis not present

## 2016-11-14 DIAGNOSIS — G4733 Obstructive sleep apnea (adult) (pediatric): Secondary | ICD-10-CM | POA: Diagnosis not present

## 2016-11-14 DIAGNOSIS — G64 Other disorders of peripheral nervous system: Secondary | ICD-10-CM | POA: Diagnosis not present

## 2016-11-14 DIAGNOSIS — I69352 Hemiplegia and hemiparesis following cerebral infarction affecting left dominant side: Secondary | ICD-10-CM | POA: Diagnosis not present

## 2016-11-14 NOTE — Telephone Encounter (Signed)
Richard Fernandez states they sent over a letter of medical necessity for the pt and have not received back yet. Would like to know if you have seen and can send back.

## 2016-11-18 ENCOUNTER — Telehealth: Payer: Self-pay | Admitting: Family Medicine

## 2016-11-18 DIAGNOSIS — G64 Other disorders of peripheral nervous system: Secondary | ICD-10-CM | POA: Diagnosis not present

## 2016-11-18 DIAGNOSIS — Z87891 Personal history of nicotine dependence: Secondary | ICD-10-CM | POA: Diagnosis not present

## 2016-11-18 DIAGNOSIS — I69398 Other sequelae of cerebral infarction: Secondary | ICD-10-CM | POA: Diagnosis not present

## 2016-11-18 DIAGNOSIS — E785 Hyperlipidemia, unspecified: Secondary | ICD-10-CM | POA: Diagnosis not present

## 2016-11-18 DIAGNOSIS — Z7901 Long term (current) use of anticoagulants: Secondary | ICD-10-CM | POA: Diagnosis not present

## 2016-11-18 DIAGNOSIS — I69353 Hemiplegia and hemiparesis following cerebral infarction affecting right non-dominant side: Secondary | ICD-10-CM | POA: Diagnosis not present

## 2016-11-18 DIAGNOSIS — I69322 Dysarthria following cerebral infarction: Secondary | ICD-10-CM | POA: Diagnosis not present

## 2016-11-18 DIAGNOSIS — Z9181 History of falling: Secondary | ICD-10-CM | POA: Diagnosis not present

## 2016-11-18 DIAGNOSIS — G4733 Obstructive sleep apnea (adult) (pediatric): Secondary | ICD-10-CM | POA: Diagnosis not present

## 2016-11-18 DIAGNOSIS — I69352 Hemiplegia and hemiparesis following cerebral infarction affecting left dominant side: Secondary | ICD-10-CM | POA: Diagnosis not present

## 2016-11-18 NOTE — Telephone Encounter (Signed)
OK to refill

## 2016-11-18 NOTE — Telephone Encounter (Signed)
I cant remember if I put this in your SIGN folder. Do you remember something on this patient?

## 2016-11-18 NOTE — Telephone Encounter (Signed)
Pt need new rx gabapentin100 mg #90 w/refills send to [facility]. This med was prescribe by md in hospital

## 2016-11-18 NOTE — Telephone Encounter (Signed)
Yes.  I do remember signing something for him (order for AFO brace).

## 2016-11-18 NOTE — Telephone Encounter (Signed)
Pt was last seen on 12/1/2017after hospital visit. He was Rxed Gabapentin 100mg  1 bid. Okay for refill under your name?

## 2016-11-19 ENCOUNTER — Other Ambulatory Visit: Payer: Self-pay

## 2016-11-19 MED ORDER — GABAPENTIN 100 MG PO CAPS: 100.0000 mg | ORAL_CAPSULE | Freq: Two times a day (BID) | ORAL | 2 refills | Status: DC

## 2016-11-19 NOTE — Telephone Encounter (Signed)
Form has been faxed.

## 2016-11-19 NOTE — Telephone Encounter (Signed)
Medication sent in for patient. 

## 2017-01-01 ENCOUNTER — Ambulatory Visit (INDEPENDENT_AMBULATORY_CARE_PROVIDER_SITE_OTHER): Payer: Medicare Other | Admitting: Family Medicine

## 2017-01-01 ENCOUNTER — Encounter: Payer: Self-pay | Admitting: Family Medicine

## 2017-01-01 VITALS — BP 110/60 | HR 65 | Ht 68.0 in | Wt 233.9 lb

## 2017-01-01 DIAGNOSIS — R531 Weakness: Secondary | ICD-10-CM | POA: Diagnosis not present

## 2017-01-01 DIAGNOSIS — F4310 Post-traumatic stress disorder, unspecified: Secondary | ICD-10-CM

## 2017-01-01 DIAGNOSIS — E7849 Other hyperlipidemia: Secondary | ICD-10-CM

## 2017-01-01 DIAGNOSIS — E784 Other hyperlipidemia: Secondary | ICD-10-CM | POA: Diagnosis not present

## 2017-01-01 NOTE — Progress Notes (Signed)
Pre visit review using our clinic review tool, if applicable. No additional management support is needed unless otherwise documented below in the visit note. 

## 2017-01-01 NOTE — Progress Notes (Signed)
Subjective:     Patient ID: Richard Fernandez, male   DOB: 03-06-1941, 76 y.o.   MRN: [id number]  HPI Seen for hospital follow-up from back in December. He had presented with initial concern for possible TIA but he had significant inconsistencies on exam and suspected psychogenic etiology to left upper extremity weakness. He states he still has some weakness at this time. He finished full physical therapy and occupational therapy. 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 He has no new complaints today.   His depression is stable on fluoxetine. He has history of tremors and is on low-dose gabapentin for that. He is on simvastatin and Plavix. Denies any recent new weakness. No chest pains.  Past Medical History:  Diagnosis Date  . CEREBROVASCULAR ACCIDENT, HX OF 11/29/2010  . DEPRESSION 11/29/2010  . Hearing loss   . HYPERLIPIDEMIA 11/29/2010  . OBSTRUCTIVE SLEEP APNEA 11/29/2010  . PTSD (post-traumatic stress disorder)   . Stroke [facility])    Past Surgical History:  Procedure Laterality Date  . [place]  . HAND SURGERY    . KNEE SURGERY    . SHOULDER ARTHROSCOPY WITH ROTATOR CUFF REPAIR      reports that he quit smoking about 28 years ago. His smoking use included Cigarettes. He has a 30.00 pack-year smoking history. He has never used smokeless tobacco. He reports that he does not drink alcohol or use drugs. family history includes Breast cancer in his mother; Heart failure in his father. He was adopted. No Known Allergies   Review of Systems  Constitutional: Negative for fatigue.  Eyes: Negative for visual disturbance.  Respiratory: Negative for cough, chest tightness and shortness of breath.   Cardiovascular: Negative for chest pain, palpitations and leg swelling.  Genitourinary: Negative for dysuria.  Neurological: Negative for dizziness, syncope, weakness,  light-headedness and headaches.  Psychiatric/Behavioral: Negative for agitation, confusion, dysphoric mood and suicidal ideas.       Objective:   Physical Exam  Constitutional: He is oriented to person, place, and time. He appears well-developed and well-nourished.  HENT:  Right Ear: External ear normal.  Left Ear: External ear normal.  Mouth/Throat: Oropharynx is clear and moist.  Eyes: Pupils are equal, round, and reactive to light.  Neck: Neck supple. No thyromegaly present.  Cardiovascular: Normal rate and regular rhythm.   Pulmonary/Chest: Effort normal and breath sounds normal. No respiratory distress. He has no wheezes. He has no rales.  Musculoskeletal: He exhibits no edema.  Neurological: He is alert and oriented to person, place, and time. No cranial nerve deficit.  He has significant ongoing inconsistencies with exam. He has weakness with hand grip on the left with gripping the fingers but then when I handed him a fairly heavy textbook was able to hold without much difficulty.       Assessment:     -#1Left upper extremity weakness with no definitive evidence for TIA or stroke. Suspected psychogenic etiology  #2 history of dyslipidemia  #3 obstructive sleep apnea  #4 history of post- traumatic stress disorder    Plan:     -Continue current medications -Routine follow-up in 6 months and obtain lab work including lipid panel then -follow up sooner for any new neurologic symptoms.    [name] MD [facility] at [facility]

## 2017-01-22 ENCOUNTER — Telehealth: Payer: Self-pay | Admitting: Family Medicine

## 2017-01-22 NOTE — Telephone Encounter (Signed)
FYI pt is calling to let us know if [employer] send a request for cpap supplies do not honor the request. Pt will contact [facility]

## 2017-01-23 NOTE — Telephone Encounter (Signed)
Waiting for paper work from [facility] to arrive.

## 2017-01-29 ENCOUNTER — Ambulatory Visit (INDEPENDENT_AMBULATORY_CARE_PROVIDER_SITE_OTHER): Payer: Medicare Other | Admitting: Adult Health

## 2017-01-29 ENCOUNTER — Encounter: Payer: Self-pay | Admitting: Adult Health

## 2017-01-29 VITALS — BP 118/72 | HR 69 | Ht 68.0 in | Wt 220.0 lb

## 2017-01-29 DIAGNOSIS — Z9989 Dependence on other enabling machines and devices: Secondary | ICD-10-CM

## 2017-01-29 DIAGNOSIS — G4733 Obstructive sleep apnea (adult) (pediatric): Secondary | ICD-10-CM | POA: Diagnosis not present

## 2017-01-29 NOTE — Patient Instructions (Signed)
Continue using CPAP nightly Use >4 hours each night If your symptoms worsen or you develop new symptoms please let us know.
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
## 2017-01-29 NOTE — Progress Notes (Signed)
I agree with the assessment and plan as directed by NP .The patient is known to me .   Lyrical Sowle, MD  

## 2017-02-04 ENCOUNTER — Telehealth: Payer: Self-pay | Admitting: Adult Health

## 2017-02-04 DIAGNOSIS — G4733 Obstructive sleep apnea (adult) (pediatric): Secondary | ICD-10-CM

## 2017-02-04 DIAGNOSIS — Z9989 Dependence on other enabling machines and devices: Principal | ICD-10-CM

## 2017-02-04 NOTE — Telephone Encounter (Signed)
I have reached out to [facility] to find out if he is eligible for a new machine.

## 2017-02-04 NOTE — Telephone Encounter (Signed)
Pt is asking that Jinny Blossom be informed that [facility] be contacted to request a new CPAP machine

## 2017-02-05 NOTE — Telephone Encounter (Signed)
This encounter was created in error - please disregard.

## 2017-02-05 NOTE — Addendum Note (Signed)
Addended by: Lester [place] A on: 02/05/2017 11:44 AM   Modules accepted: Orders

## 2017-02-05 NOTE — Telephone Encounter (Signed)
Received this message from [facility]:  "I have sent the order through.  Typically insurance will cover a New machine every 5 years. If he started therapy in 2011 after his first SS's he should be eligible. We would just need an order for a new machine. "

## 2017-02-06 NOTE — Telephone Encounter (Signed)
I called pt. I advised him that we have sent [facility] an order for his cpap. Pt says that [facility] has already called him. Pt thanked [place] for our time and effort.

## 2017-02-06 NOTE — Telephone Encounter (Signed)
Received this notice from [facility]:  I have pulled the new order for replacement and sent into office.

## 2017-02-07 DIAGNOSIS — G4733 Obstructive sleep apnea (adult) (pediatric): Secondary | ICD-10-CM | POA: Diagnosis not present

## 2017-02-17 ENCOUNTER — Other Ambulatory Visit: Payer: Self-pay | Admitting: Family Medicine

## 2017-02-17 NOTE — Telephone Encounter (Signed)
busPIRone (BUSPAR) 7.5 MG tablet last refill 07/17/16. Last office visit 01/01/17.  Okay to fill?

## 2017-02-17 NOTE — Telephone Encounter (Signed)
Refill ok? 

## 2017-03-09 DIAGNOSIS — G4733 Obstructive sleep apnea (adult) (pediatric): Secondary | ICD-10-CM | POA: Diagnosis not present

## 2017-03-25 ENCOUNTER — Telehealth: Payer: Self-pay

## 2017-03-25 ENCOUNTER — Encounter: Payer: Self-pay | Admitting: Neurology

## 2017-03-25 ENCOUNTER — Ambulatory Visit (INDEPENDENT_AMBULATORY_CARE_PROVIDER_SITE_OTHER): Payer: Medicare Other | Admitting: Neurology

## 2017-03-25 VITALS — BP 116/65 | HR 60 | Resp 20 | Ht 68.0 in | Wt 239.0 lb

## 2017-03-25 DIAGNOSIS — G4733 Obstructive sleep apnea (adult) (pediatric): Secondary | ICD-10-CM | POA: Diagnosis not present

## 2017-03-25 DIAGNOSIS — Z9989 Dependence on other enabling machines and devices: Secondary | ICD-10-CM

## 2017-03-25 DIAGNOSIS — M21371 Foot drop, right foot: Secondary | ICD-10-CM
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
## 2017-03-25 NOTE — Telephone Encounter (Signed)
-----   Message from Patsy Baltimore sent at 03/25/2017 12:23 PM EDT ----- Vickie Epley,  Pt's current pap unit is for a set pressure not an auto. Can Dr. Brett Fairy enter order for a 2 week auto titration with pressures of 5-15cm?   Thanks.  Angie ----- Message ----- From: Patsy [place] Sent: 03/25/2017  12:08 PM To: Leeroy Bock, Juwann Sherk, RN  [employer],  Order has been sent.   Angie ----- Message ----- From: L[place], RN Sent: 03/25/2017  11:50 AM To: Patsy Baltimore  New order in!

## 2017-03-25 NOTE — Telephone Encounter (Signed)
Order sent to [facility] °

## 2017-03-27 ENCOUNTER — Ambulatory Visit: Payer: Self-pay | Admitting: Adult Health

## 2017-04-01 ENCOUNTER — Other Ambulatory Visit: Payer: Self-pay | Admitting: Family Medicine

## 2017-04-02 DIAGNOSIS — H40013 Open angle with borderline findings, low risk, bilateral: Secondary | ICD-10-CM | POA: Diagnosis not present

## 2017-04-02 DIAGNOSIS — Z8673 Personal history of transient ischemic attack (TIA), and cerebral infarction without residual deficits: Secondary | ICD-10-CM | POA: Diagnosis not present

## 2017-04-09 DIAGNOSIS — G4733 Obstructive sleep apnea (adult) (pediatric): Secondary | ICD-10-CM | POA: Diagnosis not present

## 2017-04-21 ENCOUNTER — Encounter: Payer: Self-pay | Admitting: Adult Health

## 2017-05-08 ENCOUNTER — Ambulatory Visit (INDEPENDENT_AMBULATORY_CARE_PROVIDER_SITE_OTHER): Payer: Medicare Other | Admitting: Adult Health

## 2017-05-08 ENCOUNTER — Encounter (INDEPENDENT_AMBULATORY_CARE_PROVIDER_SITE_OTHER): Payer: Self-pay

## 2017-05-08 ENCOUNTER — Encounter: Payer: Self-pay | Admitting: Adult Health

## 2017-05-08 VITALS — BP 107/64 | HR 64 | Ht 68.0 in | Wt 227.0 lb

## 2017-05-08 DIAGNOSIS — G4733 Obstructive sleep apnea (adult) (pediatric): Secondary | ICD-10-CM

## 2017-05-08 DIAGNOSIS — Z9989 Dependence on other enabling machines and devices: Secondary | ICD-10-CM | POA: Diagnosis not present

## 2017-05-08 NOTE — Patient Instructions (Signed)
Your Plan:  Continue using CPAP nightly  Pressure will be increased to 15 cm H20 If your symptoms worsen or you develop new symptoms please let us know.   Thank you for coming to see [place] at [facility]. I hope we have been able to provide you high quality care today.  You may receive a patient satisfaction survey over the next few weeks. We would appreciate your feedback and comments so that we may continue to improve ourselves and the health of our patients.

## 2017-05-08 NOTE — Progress Notes (Signed)
I agree with the assessment and plan as directed by NP .The patient is known to me .   Amiel Sharrow, MD  
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
## 2017-05-09 DIAGNOSIS — G4733 Obstructive sleep apnea (adult) (pediatric): Secondary | ICD-10-CM | POA: Diagnosis not present

## 2017-05-12 ENCOUNTER — Telehealth: Payer: Self-pay | Admitting: Neurology

## 2017-05-12 DIAGNOSIS — G4733 Obstructive sleep apnea (adult) (pediatric): Secondary | ICD-10-CM

## 2017-05-12 DIAGNOSIS — Z9989 Dependence on other enabling machines and devices: Principal | ICD-10-CM

## 2017-05-12 NOTE — Telephone Encounter (Signed)
Mr. Thurston Pounds CPAP download shows a sufficient compliance at 87%, average user time 6 hours 54 minutes, on AutoSet between 5 and 15 cm water with 2 cm EPR. His residual apnea index was 5.1 of which 3.6 were obstructive in nature. The residual AHI was 7.0 I would like to increase the upper range of pressure by 1 cm to the upper limit being 16 cm water in order to capture his 95th percentile pressure, estimated at 14.6 cm water. CD

## 2017-05-13 ENCOUNTER — Telehealth: Payer: Self-pay | Admitting: Neurology

## 2017-05-13 NOTE — Telephone Encounter (Signed)
I spoke with Dr. Brett Fairy. She would like to keep the pressure at 15 cm H20. Please advise DME company to continue with that order.

## 2017-05-13 NOTE — Telephone Encounter (Signed)
I called pt and explained to him that [facility] will use the increased pressure to 15 cm H2O order and not the auto titration order. Pt verbalized understanding.

## 2017-05-13 NOTE — Telephone Encounter (Signed)
Patient needs a new Rx for CPAP machine sent to [facility]. Order was sent on wrong machine.

## 2017-05-13 NOTE — Telephone Encounter (Signed)
I asked [facility] to keep the order for the pressure at cpap of 15 cm H2O.

## 2017-05-13 NOTE — Telephone Encounter (Signed)
Dr. Brett Fairy placed an order yesterday for pt to have an auto pap machine set at 5-16 cm H2O. However, pt's machine is not auto capable. (See phone note from 03/25/2017.) This is why an auto titration 2 week was done. Megan, NP saw pt on 05/08/2017 and ordered pt's cpap pressure to be set at 15 cm H2O.  Dr. Brett Fairy will need to review this and decide what the next action is. Should [facility] keep Megan's order form 05/08/2017 or should [facility] pursue another 2 week auto titration for pt set at 5-16 cm H2O?

## 2017-05-13 NOTE — Telephone Encounter (Signed)
His auto-titration 95% straddle the upper pressure limit at 15 cm water, I would appreciate a change to 16 cm for that reason.   Can we determine when he is due for a new CPAP ( this year, next year).

## 2017-05-16 ENCOUNTER — Telehealth: Payer: Self-pay | Admitting: Neurology

## 2017-05-16 NOTE — Telephone Encounter (Signed)
Spoke with representative from [facility] on patient. She will call the respiratory department and find out why he was not given an answer to his questions. They will be in touch with patient. She expressed her apologies.

## 2017-05-16 NOTE — Telephone Encounter (Signed)
Noted, thank you

## 2017-05-16 NOTE — Telephone Encounter (Signed)
Patient called office in reference to mask not working on his CPAP machine per patient he will wake up and the bottom part of his mask will be in his mouth.  Per patients wife she has witnessed the patient stop breathing.  Patient contacted [facility] and advised patient they have no respiratory therapist that will be available until either tonight at 8:00pm or Monday.  Please call

## 2017-05-22 DIAGNOSIS — G4733 Obstructive sleep apnea (adult) (pediatric): Secondary | ICD-10-CM | POA: Diagnosis not present

## 2017-06-09 DIAGNOSIS — G4733 Obstructive sleep apnea (adult) (pediatric): Secondary | ICD-10-CM | POA: Diagnosis not present

## 2017-07-01 ENCOUNTER — Ambulatory Visit: Payer: Self-pay | Admitting: Family Medicine

## 2017-07-04 ENCOUNTER — Encounter: Payer: Self-pay | Admitting: Family Medicine

## 2017-07-04 ENCOUNTER — Ambulatory Visit (INDEPENDENT_AMBULATORY_CARE_PROVIDER_SITE_OTHER): Payer: Medicare Other | Admitting: Family Medicine

## 2017-07-04 VITALS — BP 118/70 | HR 71 | Temp 98.1°F | Ht 68.0 in | Wt 242.5 lb

## 2017-07-04 DIAGNOSIS — G4733 Obstructive sleep apnea (adult) (pediatric): Secondary | ICD-10-CM

## 2017-07-04 DIAGNOSIS — E7849 Other hyperlipidemia: Secondary | ICD-10-CM

## 2017-07-04 DIAGNOSIS — F330 Major depressive disorder, recurrent, mild: Secondary | ICD-10-CM

## 2017-07-04 DIAGNOSIS — E784 Other hyperlipidemia: Secondary | ICD-10-CM | POA: Diagnosis not present

## 2017-07-04 NOTE — Patient Instructions (Signed)
Remember flu vaccine later this Fall.

## 2017-07-04 NOTE — Progress Notes (Signed)
Subjective:     Patient ID: Richard Fernandez, male   DOB: June 08, 1941, 76 y.o.   MRN: [id number]  HPI Patient seen for medical follow-up. He has history of recurrent depression, hyperlipidemia, obstructive sleep apnea. He is using CPAP regularly. Medications reviewed. Compliant with all. He remains on simvastatin for hyperlipidemia. Has not had lipid panel in over one year.  He feels his anxiety and depression symptoms are currently stable. He's had extensive counseling in the past.  Past Medical History:  Diagnosis Date  . CEREBROVASCULAR ACCIDENT, HX OF 11/29/2010  . DEPRESSION 11/29/2010  . Hearing loss   . HYPERLIPIDEMIA 11/29/2010  . OBSTRUCTIVE SLEEP APNEA 11/29/2010  . PTSD (post-traumatic stress disorder)   . Stroke [facility])    Past Surgical History:  Procedure Laterality Date  . [place]  . HAND SURGERY    . KNEE SURGERY    . SHOULDER ARTHROSCOPY WITH ROTATOR CUFF REPAIR      reports that he quit smoking about 28 years ago. His smoking use included Cigarettes. He has a 30.00 pack-year smoking history. He has never used smokeless tobacco. He reports that he does not drink alcohol or use drugs. family history includes Breast cancer in his mother; Heart failure in his father. He was adopted. No Known Allergies   Review of Systems  Constitutional: Negative for fatigue.  Eyes: Negative for visual disturbance.  Respiratory: Negative for cough, chest tightness and shortness of breath.   Cardiovascular: Negative for chest pain, palpitations and leg swelling.  Neurological: Negative for dizziness, syncope, weakness, light-headedness and headaches.       Objective:   Physical Exam  Constitutional: He is oriented to person, place, and time. He appears well-developed and well-nourished.  HENT:  Right Ear: External ear normal.  Left Ear: External ear normal.  Mouth/Throat: Oropharynx is clear and moist.  Eyes: Pupils are equal, round, and reactive to light.  Neck: Neck supple.  No thyromegaly present.  Cardiovascular: Normal rate and regular rhythm.   Pulmonary/Chest: Effort normal and breath sounds normal. No respiratory distress. He has no wheezes. He has no rales.  Musculoskeletal: He exhibits no edema.  Neurological: He is alert and oriented to person, place, and time.       Assessment:     #1 hyperlipidemia  #2 history of recurrent depression  #3 obstructive sleep apnea    Plan:     -Patient nonfasting today and will return for fasting labs next week with lipid panel, hepatic panel, basic metabolic panel -Recommended flu vaccine and he wishes to wait a month before getting that  [name] MD [facility]

## 2017-07-09 ENCOUNTER — Other Ambulatory Visit (INDEPENDENT_AMBULATORY_CARE_PROVIDER_SITE_OTHER): Payer: Medicare Other

## 2017-07-09 DIAGNOSIS — E7849 Other hyperlipidemia: Secondary | ICD-10-CM

## 2017-07-09 DIAGNOSIS — E784 Other hyperlipidemia: Secondary | ICD-10-CM

## 2017-07-09 LAB — HEPATIC FUNCTION PANEL
ALT: 14 U/L (ref 0–53)
AST: 15 U/L (ref 0–37)
Albumin: 3.9 g/dL (ref 3.5–5.2)
Alkaline Phosphatase: 55 U/L (ref 39–117)
BILIRUBIN DIRECT: 0.1 mg/dL (ref 0.0–0.3)
TOTAL PROTEIN: 6.4 g/dL (ref 6.0–8.3)
Total Bilirubin: 0.4 mg/dL (ref 0.2–1.2)

## 2017-07-09 LAB — BASIC METABOLIC PANEL
BUN: 13 mg/dL (ref 6–23)
CHLORIDE: 101 meq/L (ref 96–112)
CO2: 29 meq/L (ref 19–32)
CREATININE: 0.7 mg/dL (ref 0.40–1.50)
Calcium: 8.8 mg/dL (ref 8.4–10.5)
GFR: 116.46 mL/min (ref >60.00)
Glucose, Bld: 101 mg/dL — ABNORMAL HIGH (ref 70–99)
POTASSIUM: 4.2 meq/L (ref 3.5–5.1)
Sodium: 138 mEq/L (ref 135–145)

## 2017-07-09 LAB — LIPID PANEL
CHOL/HDL RATIO: 3
Cholesterol: 160 mg/dL (ref 0–200)
HDL: 49.9 mg/dL (ref >39.00)
LDL CALC: 95 mg/dL (ref 0–99)
NonHDL: 110.01
TRIGLYCERIDES: 75 mg/dL (ref 0.0–149.0)
VLDL: 15 mg/dL (ref 0.0–40.0)

## 2017-07-10 DIAGNOSIS — G4733 Obstructive sleep apnea (adult) (pediatric): Secondary | ICD-10-CM | POA: Diagnosis not present

## 2017-07-24 DIAGNOSIS — G4733 Obstructive sleep apnea (adult) (pediatric): Secondary | ICD-10-CM | POA: Diagnosis not present

## 2017-07-25 DIAGNOSIS — L570 Actinic keratosis: Secondary | ICD-10-CM | POA: Diagnosis not present

## 2017-08-09 DIAGNOSIS — G4733 Obstructive sleep apnea (adult) (pediatric): Secondary | ICD-10-CM | POA: Diagnosis not present

## 2017-08-18 ENCOUNTER — Other Ambulatory Visit: Payer: Self-pay | Admitting: Family Medicine

## 2017-08-25 DIAGNOSIS — R3915 Urgency of urination: Secondary | ICD-10-CM | POA: Diagnosis not present

## 2017-09-09 ENCOUNTER — Encounter: Payer: Self-pay | Admitting: Neurology

## 2017-09-09 DIAGNOSIS — G4733 Obstructive sleep apnea (adult) (pediatric): Secondary | ICD-10-CM | POA: Diagnosis not present

## 2017-09-10 ENCOUNTER — Encounter (INDEPENDENT_AMBULATORY_CARE_PROVIDER_SITE_OTHER): Payer: Self-pay

## 2017-09-10 ENCOUNTER — Encounter: Payer: Self-pay | Admitting: Neurology

## 2017-09-10 ENCOUNTER — Ambulatory Visit: Payer: Medicare Other | Admitting: Neurology

## 2017-09-10 VITALS — BP 100/62 | HR 60 | Ht 68.0 in | Wt 244.0 lb

## 2017-09-10 DIAGNOSIS — Z9989 Dependence on other enabling machines and devices: Secondary | ICD-10-CM | POA: Diagnosis not present

## 2017-09-10 DIAGNOSIS — G4733 Obstructive sleep apnea (adult) (pediatric): Secondary | ICD-10-CM | POA: Diagnosis not present
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
## 2017-09-10 NOTE — Patient Instructions (Signed)

## 2017-09-24 DIAGNOSIS — L570 Actinic keratosis: Secondary | ICD-10-CM | POA: Diagnosis not present

## 2017-10-06 DIAGNOSIS — H40013 Open angle with borderline findings, low risk, bilateral: Secondary | ICD-10-CM | POA: Diagnosis not present

## 2017-10-06 LAB — HM DIABETES EYE EXAM

## 2017-10-09 DIAGNOSIS — G4733 Obstructive sleep apnea (adult) (pediatric): Secondary | ICD-10-CM | POA: Diagnosis not present

## 2017-10-10 ENCOUNTER — Other Ambulatory Visit: Payer: Self-pay | Admitting: Family Medicine

## 2017-10-24 ENCOUNTER — Other Ambulatory Visit: Payer: Self-pay | Admitting: Family Medicine

## 2017-10-29 DIAGNOSIS — G4733 Obstructive sleep apnea (adult) (pediatric): Secondary | ICD-10-CM | POA: Diagnosis not present

## 2017-11-09 DIAGNOSIS — G4733 Obstructive sleep apnea (adult) (pediatric): Secondary | ICD-10-CM | POA: Diagnosis not present

## 2017-11-17 ENCOUNTER — Encounter: Payer: Self-pay | Admitting: Family Medicine

## 2017-11-24 ENCOUNTER — Other Ambulatory Visit: Payer: Self-pay | Admitting: Family Medicine

## 2017-11-24 NOTE — Telephone Encounter (Signed)
Refill for 6 months. 

## 2017-11-24 NOTE — Telephone Encounter (Signed)
Last refill 08/18/17 and last office visit 07/04/17.  Okay to fill?

## 2017-12-03 DIAGNOSIS — G4733 Obstructive sleep apnea (adult) (pediatric): Secondary | ICD-10-CM | POA: Diagnosis not present

## 2018-01-08 ENCOUNTER — Telehealth: Payer: Self-pay | Admitting: Family Medicine

## 2018-01-08 DIAGNOSIS — S8991XA Unspecified injury of right lower leg, initial encounter: Secondary | ICD-10-CM

## 2018-01-08 NOTE — Telephone Encounter (Signed)
OK to order 

## 2018-01-08 NOTE — Telephone Encounter (Signed)
Copied from [place]. Topic: Inquiry >> Jan 08, 2018  1:27 PM Pricilla Handler wrote: Reason for CRM: Patient called requesting that Dr. Elease Hashimoto write an order for patient to receive a full leg brace for his right leg. Patient would like it completed by Friday 01/09/2018.

## 2018-01-09 NOTE — Telephone Encounter (Signed)
I called the pt and informed him Dr Elease Hashimoto approved the order.  Patient stated he needs a brace due to a previous right leg injury and this was left at the front desk for pick up.

## 2018-01-14 ENCOUNTER — Telehealth: Payer: Self-pay | Admitting: Neurology

## 2018-01-14 NOTE — Telephone Encounter (Signed)
error 
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
## 2018-01-21 ENCOUNTER — Encounter: Payer: Self-pay | Admitting: Nurse Practitioner

## 2018-01-22 ENCOUNTER — Ambulatory Visit: Payer: Medicare Other | Admitting: Nurse Practitioner

## 2018-01-22 ENCOUNTER — Encounter (INDEPENDENT_AMBULATORY_CARE_PROVIDER_SITE_OTHER): Payer: Self-pay

## 2018-01-22 ENCOUNTER — Encounter: Payer: Self-pay | Admitting: Nurse Practitioner

## 2018-01-22 VITALS — BP 103/57 | HR 59 | Wt 240.0 lb

## 2018-01-22 DIAGNOSIS — Z8673 Personal history of transient ischemic attack (TIA), and cerebral infarction without residual deficits: Secondary | ICD-10-CM

## 2018-01-22 DIAGNOSIS — E669 Obesity, unspecified: Secondary | ICD-10-CM

## 2018-01-22 DIAGNOSIS — Z9989 Dependence on other enabling machines and devices: Secondary | ICD-10-CM | POA: Diagnosis not present

## 2018-01-22 DIAGNOSIS — G4733 Obstructive sleep apnea (adult) (pediatric): Secondary | ICD-10-CM | POA: Diagnosis not present

## 2018-01-22 NOTE — Patient Instructions (Signed)
Compliance data greater than 4 hours 90% Significant leak, need to get mask fitting which will then probably bring AHI to more normal level Follow-up 3-4 months

## 2018-01-26 ENCOUNTER — Encounter: Payer: Self-pay | Admitting: Family Medicine

## 2018-01-26 ENCOUNTER — Ambulatory Visit (INDEPENDENT_AMBULATORY_CARE_PROVIDER_SITE_OTHER): Payer: Medicare Other | Admitting: Family Medicine

## 2018-01-26 VITALS — BP 102/62 | HR 64 | Temp 98.3°F | Wt 237.8 lb

## 2018-01-26 DIAGNOSIS — R5383 Other fatigue: Secondary | ICD-10-CM | POA: Diagnosis not present

## 2018-01-26 DIAGNOSIS — R400 Somnolence: Secondary | ICD-10-CM

## 2018-01-26 NOTE — Patient Instructions (Addendum)
Try reducing the Gabapentin down to one daily (take the night dose) for one week and then try stopping altogether.  If one week after stopping Gabapentin still feel fatigued, then try to reduce the Buspar to once daily for one week and then discontinue.

## 2018-01-26 NOTE — Progress Notes (Signed)
Subjective:     Patient ID: Richard Fernandez, male   DOB: 05/14/1941, 77 y.o.   MRN: [id number]  HPI Patient seen with complaints of some daytime somnolence and fatigue. He has history of obstructive sleep apnea and recently saw neurology. He has been compliant with CPAP. Issue was brought up whether some of his fatigue and daytime sleepiness could be related to medications.  His other medical problems include history of posttraumatic stress disorder, obesity, hyperlipidemia, history of CVA. Generally sleeping well at night. No recent chest pains. No dyspnea. Appetite and weight stable.  We reviewed his medication list. He is on at least couple medications that could be causing some fatigue and daytime somnolence including low-dose gabapentin and BuSpar. He was placed on BuSpar few years ago for increasing anxiety symptoms. He was placed on gabapentin he states for "tremor ". Currently on low-dose of 100 mg twice daily  Past Medical History:  Diagnosis Date  . CEREBROVASCULAR ACCIDENT, HX OF 11/29/2010  . DEPRESSION 11/29/2010  . Hearing loss   . HYPERLIPIDEMIA 11/29/2010  . OBSTRUCTIVE SLEEP APNEA 11/29/2010   CPAP  . PTSD (post-traumatic stress disorder)   . Stroke [facility])    Past Surgical History:  Procedure Laterality Date  . [place]  . HAND SURGERY    . KNEE SURGERY    . SHOULDER ARTHROSCOPY WITH ROTATOR CUFF REPAIR      reports that he quit smoking about 29 years ago. His smoking use included cigarettes. He has a 30.00 pack-year smoking history. He has never used smokeless tobacco. He reports that he does not drink alcohol or use drugs. family history includes Breast cancer in his mother; Heart failure in his father. He was adopted. No Known Allergies   Review of Systems  Constitutional: Positive for fatigue. Negative for appetite change, chills, fever and unexpected weight change.  Respiratory: Negative for shortness of breath.   Cardiovascular: Negative for chest pain.   Gastrointestinal: Negative for abdominal pain, diarrhea, nausea and vomiting.  Endocrine: Negative for polydipsia and polyuria.  Genitourinary: Negative for dysuria.  Neurological: Negative for dizziness and headaches.  Psychiatric/Behavioral: Negative for dysphoric mood.       Objective:   Physical Exam  Constitutional: He is oriented to person, place, and time. He appears well-developed and well-nourished.  HENT:  Mouth/Throat: Oropharynx is clear and moist.  Neck: Neck supple. No thyromegaly present.  Cardiovascular: Normal rate and regular rhythm.  Pulmonary/Chest: Effort normal and breath sounds normal. No respiratory distress. He has no wheezes. He has no rales.  Musculoskeletal: He exhibits no edema.  Neurological: He is alert and oriented to person, place, and time. No cranial nerve deficit.  Psychiatric: He has a normal mood and affect. His behavior is normal.       Assessment:     Patient presents with increasing daytime somnolence and fatigue. He has obstructive sleep apnea but is on regular CPAP. Possibly medication related    Plan:     -Recommend tapering his gabapentin back to 100 mg daily at bedtime for one week and then try discontinuing. -If fatigue and daytime somnolence not improved with the above then consider tapering off BuSpar -He is encouraged to lose some weight -If no relief with any of the above consider further lab work including TSH  [name] MD [facility] at [facility]

## 2018-01-29 ENCOUNTER — Ambulatory Visit: Payer: Medicare Other | Admitting: Neurology

## 2018-02-03 DIAGNOSIS — G4733 Obstructive sleep apnea (adult) (pediatric): Secondary | ICD-10-CM | POA: Diagnosis not present

## 2018-02-04 ENCOUNTER — Ambulatory Visit: Payer: Self-pay | Admitting: *Deleted

## 2018-02-04 NOTE — Telephone Encounter (Signed)
Patient is aware 

## 2018-02-04 NOTE — Telephone Encounter (Signed)
He is on low dose of just 100 mg once daily.  If he is concerned, could take to 100 mg dose once daily for another week and then try to d/c.

## 2018-02-04 NOTE — Telephone Encounter (Signed)
Weaning off Gabapentin. Patient felt lightheaded this morning- and today.Patient reports symptoms since he has stepped down on the medication. Patient has decreased dose from twice daily to once daily- tonight is the last day of once daily. He is supposed to be completely off tomorrow and is concerned about tomorrow. Patient had been on once daily dose for 2 weeks- he has only been dizzy today. Patient worked through triage questions and he wants to wait and see how he feels in the morning- I told him I would let him call first thing in the morning to report- our phones go on at 6:30. He states he will call- I will let his provider know.  Reason for Disposition . [1] MILD dizziness (e.g., walking normally) AND [2] has NOT been evaluated by physician for this  (Exception: dizziness caused by heat exposure, sudden standing, or poor fluid intake)  Answer Assessment - Initial Assessment Questions 1. DESCRIPTION: "Describe your dizziness."     Lightheaded- has to concentrate so he does not wooble  2. LIGHTHEADED: "Do you feel lightheaded?" (e.g., somewhat faint, woozy, weak upon standing)     Some weakness when standing 3. VERTIGO: "Do you feel like either you or the room is spinning or tilting?" (i.e. vertigo)     no 4. SEVERITY: "How bad is it?"  "Do you feel like you are going to faint?" "Can you stand and walk?"   - MILD - walking normally   - MODERATE - interferes with normal activities (e.g., work, school)    - SEVERE - unable to stand, requires support to walk, feels like passing out now.      mild 5. ONSET:  "When did the dizziness begin?"     This morning 6. AGGRAVATING FACTORS: "Does anything make it worse?" (e.g., standing, change in head position)     no 7. HEART RATE: "Can you tell me your heart rate?" "How many beats in 15 seconds?"  (Note: not all patients can do this)       Patient has not noticed 8. CAUSE: "What do you think is causing the dizziness?"     No idea- gabapentin was  controlling a nervous tick 9. RECURRENT SYMPTOM: "Have you had dizziness before?" If so, ask: "When was the last time?" "What happened that time?"     no 10. OTHER SYMPTOMS: "Do you have any other symptoms?" (e.g., fever, chest pain, vomiting, diarrhea, bleeding)       no 11. PREGNANCY: "Is there any chance you are pregnant?" "When was your last menstrual period?"       n/a  Protocols used: DIZZINESS [facility]

## 2018-02-05 ENCOUNTER — Telehealth: Payer: Self-pay | Admitting: Family Medicine

## 2018-02-05 ENCOUNTER — Encounter: Payer: Self-pay | Admitting: Family Medicine

## 2018-02-05 ENCOUNTER — Ambulatory Visit (INDEPENDENT_AMBULATORY_CARE_PROVIDER_SITE_OTHER): Payer: Medicare Other | Admitting: Family Medicine

## 2018-02-05 ENCOUNTER — Ambulatory Visit: Payer: Self-pay | Admitting: *Deleted

## 2018-02-05 VITALS — BP 110/64 | HR 65 | Temp 98.0°F | Ht 68.0 in | Wt 236.8 lb

## 2018-02-05 DIAGNOSIS — R42 Dizziness and giddiness: Secondary | ICD-10-CM | POA: Diagnosis not present

## 2018-02-05 MED ORDER — MECLIZINE HCL 25 MG PO TABS: 25.0000 mg | ORAL_TABLET | ORAL | 0 refills | Status: DC | PRN

## 2018-02-05 NOTE — Telephone Encounter (Signed)
Pt called due to ongoing symptoms; see nurse triage note dated 02/04/18' pt normally sees Dr Elease Hashimoto but he has no appointments within the guidelines per protocol; pt offered and accepted appointment with Dr Alysia Penna, [facility], 02/05/18 at 1030; he verbalizes understanding; will route to office for notification of this upcoming appointment.

## 2018-02-05 NOTE — Telephone Encounter (Signed)
Pt called stating that he had a good night but is still having dizziness; Pt called due to ongoing symptoms; see nurse triage note dated 02/04/18' pt normally sees Dr Elease Hashimoto but he has no appointments within the guidelines per protocol; pt offered and accepted appointment with Dr Alysia Penna, [facility], 02/05/18 at 1030; he verbalizes understanding; will route to office for notification of this upcoming appointment.

## 2018-02-05 NOTE — Addendum Note (Signed)
Addended by: Addison Naegeli on: 02/05/2018 08:12 AM   Modules accepted: Level of Service, SmartSet

## 2018-02-05 NOTE — Telephone Encounter (Signed)
This encounter was created in error - please disregard.

## 2018-02-05 NOTE — Progress Notes (Signed)
   Subjective:    Patient ID: Richard Fernandez, male    DOB: January 28, 1941, 77 y.o.   MRN: [id number]  HPI Here for several days of intermittent dizziness that he describes as the room spinning. This is often brought on by standing up or moving his head quickly. No headache or vision changes. No neurologic deficits beyond his baseline (he has had a stroke).    Review of Systems  Constitutional: Negative.   HENT: Negative.   Eyes: Negative.   Respiratory: Negative.   Cardiovascular: Negative.   Neurological: Positive for dizziness. Negative for facial asymmetry, speech difficulty and headaches.       Objective:   Physical Exam  Constitutional: He is oriented to person, place, and time. He appears well-developed and well-nourished.  Walks with a cane   Eyes: Pupils are equal, round, and reactive to light. Conjunctivae and EOM are normal.  Neck: Neck supple. No thyromegaly present.  Cardiovascular: Normal rate, regular rhythm, normal heart sounds and intact distal pulses.  Pulmonary/Chest: Effort normal and breath sounds normal. No respiratory distress. He has no wheezes. He has no rales.  Lymphadenopathy:    He has no cervical adenopathy.  Neurological: He is alert and oriented to person, place, and time. No cranial nerve deficit.          Assessment & Plan:  Vertigo, likely vestibular. Drink fluids. Take Claritin bid for a week or so. Add Meclizine prn.  Alysia Penna, MD

## 2018-02-10 ENCOUNTER — Telehealth: Payer: Self-pay | Admitting: Family Medicine

## 2018-02-10 NOTE — Telephone Encounter (Signed)
Copied from [place] [id number]. Topic: Inquiry >> Feb 10, 2018 12:24 PM Scherrie Gerlach wrote: Reason for CRM: michelle w/ [facility] would like to make sure you received the pt's LMN form and it can be returned by Monday 02/16/18 when the pt has an appt

## 2018-02-11 NOTE — Telephone Encounter (Signed)
Received and placed in Dr Erick Blinks folder.

## 2018-02-18 NOTE — Telephone Encounter (Signed)
Appointment made

## 2018-02-18 NOTE — Telephone Encounter (Signed)
Michelle with [facility] is calling to check status of the LMN form in order to provide products/services (CME equipment). Please call with update. It was requested 4/9 to be returned by 4/15. She also needs OV notes.

## 2018-02-18 NOTE — Telephone Encounter (Signed)
We did our best to re-order for him.  However, we received faxed information that they need office note justifying his need for brace. He has not been recently evaluated for this and will thus need office visit to address so we can document

## 2018-02-18 NOTE — Telephone Encounter (Signed)
Left message on machine for patient to call and schedule an appointment for paperwork to be completed.

## 2018-02-18 NOTE — Telephone Encounter (Signed)
Patient states that he needs the form filled out before the 25th as that is when his brace will be delivered. I can not find him a 30 min appt before then. Please call patient to get worked in. Thanks  [phone]

## 2018-02-24 ENCOUNTER — Encounter: Payer: Self-pay | Admitting: Family Medicine

## 2018-02-24 ENCOUNTER — Other Ambulatory Visit: Payer: Self-pay

## 2018-02-24 ENCOUNTER — Ambulatory Visit (INDEPENDENT_AMBULATORY_CARE_PROVIDER_SITE_OTHER): Payer: Medicare Other | Admitting: Family Medicine

## 2018-02-24 VITALS — BP 122/74 | HR 68 | Temp 98.1°F | Resp 15 | Ht 68.0 in | Wt 233.9 lb

## 2018-02-24 DIAGNOSIS — F330 Major depressive disorder, recurrent, mild: Secondary | ICD-10-CM

## 2018-02-24 DIAGNOSIS — M21371 Foot drop, right foot: Secondary | ICD-10-CM | POA: Diagnosis not present

## 2018-02-24 DIAGNOSIS — R42 Dizziness and giddiness: Secondary | ICD-10-CM

## 2018-02-24 DIAGNOSIS — M21372 Foot drop, left foot: Secondary | ICD-10-CM | POA: Insufficient documentation

## 2018-02-24 NOTE — Progress Notes (Signed)
Subjective:     Patient ID: Richard Fernandez, male   DOB: [birth year], 77 y.o.   MRN: [id number]  HPI Patient is here to discuss brace for right lower extremity. He has a long history of right foot drop going back at least 10 years. He has AFO brace which is very old and needs replacing. We had recently given a verbal order for that. They're needing face-to-face encounter justifying this. He has a very high risk of falls and tremendous difficulties ambulating without his AFO brace in place. He has seen neurology and orthopedics years ago regarding his foot drop.  Recent episode of vertigo and those symptoms have fully cleared. He took meclizine couple times.  He has chronic hearing loss and has hearing aids bilaterally  Recently complained of some daytime somnolence. We had and scale back gabapentin. Still takes 100 mg at night. Symptoms are slightly improved.  On Prozac for depression and he feels his depression symptoms are stable.  No suicidal ideation.  Past Medical History:  Diagnosis Date  . CEREBROVASCULAR ACCIDENT, HX OF 11/29/2010  . DEPRESSION 11/29/2010  . Hearing loss   . HYPERLIPIDEMIA 11/29/2010  . OBSTRUCTIVE SLEEP APNEA 11/29/2010   CPAP  . PTSD (post-traumatic stress disorder)   . Stroke [facility])    Past Surgical History:  Procedure Laterality Date  . [place]  . HAND SURGERY    . KNEE SURGERY    . SHOULDER ARTHROSCOPY WITH ROTATOR CUFF REPAIR      reports that he quit smoking about 29 years ago. His smoking use included cigarettes. He has a 30.00 pack-year smoking history. He has never used smokeless tobacco. He reports that he does not drink alcohol or use drugs. family history includes Breast cancer in his mother; Heart failure in his father. He was adopted. No Known Allergies   Review of Systems  Constitutional: Positive for fatigue. Negative for unexpected weight change.  Eyes: Negative for visual disturbance.  Respiratory: Negative for cough, chest tightness  and shortness of breath.   Cardiovascular: Negative for chest pain, palpitations and leg swelling.  Endocrine: Negative for polydipsia and polyuria.  Genitourinary: Negative for dysuria.  Neurological: Negative for dizziness, syncope, weakness, light-headedness and headaches.       Objective:   Physical Exam  Constitutional: He is oriented to person, place, and time. He appears well-developed and well-nourished.  Cardiovascular: Normal rate and regular rhythm.  Pulmonary/Chest: Effort normal and breath sounds normal. No respiratory distress. He has no wheezes. He has no rales.  Musculoskeletal: He exhibits no edema.  Neurological: He is alert and oriented to person, place, and time. No cranial nerve deficit.  Patient has AFO brace on right lower extremity. He has some persistent weakness right foot with dorsiflexion       Assessment:     #1 right foot drop. Need for chronic AFO brace  #2 history of recurrent depression currently stable on fluoxetine  #3 recent vertigo symptomatically resolved      Plan:     -avoid further use of Meclizine. -orders sent recently for right AFO brace. -continue current medications.  [name] MD [facility] [place]  -

## 2018-02-26 ENCOUNTER — Other Ambulatory Visit: Payer: Self-pay | Admitting: Family Medicine

## 2018-02-26 ENCOUNTER — Telehealth: Payer: Self-pay | Admitting: Family Medicine

## 2018-02-26 NOTE — Telephone Encounter (Signed)
Copied from [place]. Topic: General - Other >> Feb 26, 2018  8:36 AM Yvette Rack wrote: Reason for CRM: CRM: CRM[facility] they have been waiting since 02-05-18 when they faxed over forms fax number [phone]

## 2018-02-26 NOTE — Telephone Encounter (Signed)
Placed in Dr Burchette's folder 

## 2018-02-27 NOTE — Telephone Encounter (Signed)
done

## 2018-02-27 NOTE — Telephone Encounter (Signed)
Faxed and confirmed

## 2018-04-01 DIAGNOSIS — M25361 Other instability, right knee: Secondary | ICD-10-CM | POA: Diagnosis not present

## 2018-04-07 DIAGNOSIS — H40013 Open angle with borderline findings, low risk, bilateral: Secondary | ICD-10-CM | POA: Diagnosis not present

## 2018-04-22 ENCOUNTER — Telehealth: Payer: Self-pay | Admitting: Neurology

## 2018-04-22 DIAGNOSIS — Z9989 Dependence on other enabling machines and devices: Principal | ICD-10-CM

## 2018-04-22 DIAGNOSIS — G4733 Obstructive sleep apnea (adult) (pediatric): Secondary | ICD-10-CM

## 2018-04-22 NOTE — Telephone Encounter (Signed)
Pt request air to be turned down on CPAP-he said it is causing his mask to vibrate at night. Please call to advise

## 2018-04-22 NOTE — Telephone Encounter (Signed)
Called the pt. In looking at his download over the last 30 days the pressure is treating his apnea. Only in the beginning of June was there a increase in a leak with his mask. Dr Dohmeier states that the patient should be fitted for his mask and make sure he is shaving to keep a good seal. Pt states that he was just at [facility] yesterday and they stated his mask was sealing fine and informed him that his pressure was recently changed to a increase pressure and that we need to turn the pressure down. I informed the patient we had not made any changes since his last visit with [place] in July last year. The patient states that was what he thought. Dr Dohmeier is agreeable to trying to adjust the pressure to a auto range 10-15 cm of water pressure. I will send the order and inquire about someone reaching out to discuss his mask. Informed the patient if after changing the pressure if he notices there is still an issue to contact us. Pt verbalized understanding and was appreciative for the call.

## 2018-04-23 ENCOUNTER — Other Ambulatory Visit: Payer: Self-pay | Admitting: Neurology

## 2018-04-23 DIAGNOSIS — Z9989 Dependence on other enabling machines and devices: Principal | ICD-10-CM

## 2018-04-23 DIAGNOSIS — G4733 Obstructive sleep apnea (adult) (pediatric): Secondary | ICD-10-CM

## 2018-04-27 ENCOUNTER — Other Ambulatory Visit: Payer: Self-pay | Admitting: Family Medicine

## 2018-04-28 ENCOUNTER — Encounter: Payer: Self-pay | Admitting: Nurse Practitioner

## 2018-04-29 NOTE — Progress Notes (Signed)
[facility]  PATIENT: Richard Fernandez DOB: 1941/04/12   REASON FOR VISIT: Follow-up for obstructive sleep apnea with CPAP HISTORY FROM: Patient    HISTORY OF PRESENT ILLNESS:UPDATE 6/27/2019CM Richard Fernandez 77 year old male returns for follow-up, with obstructive sleep apnea follow-up.  He is doing well with his machine.  CPAP data dated 03/30/2018-04/28/2018 shows  compliance greater than 4 hours at 93%.  Average usage 6 hours 28 minutes.  Set pressure 15 cm.  EPR level 2 AHI 3.3 ESS 2.  He returns for reevaluation  UPDATE 3/21/2019CM Richard Fernandez, 77 year old male returns for follow-up with history of obstructive sleep apnea on CPAP.  His only complaint today is that he has a mask leak and he has an appointment in his DME tomorrow at 930 to get his mask checked.  Compliance data dated 12/23/2017-01/21/2018 shows compliance greater than 4 hours at 90%.  Less than 4 hours for 2 days at 7% and 1 day when he did not use the machine.  Average usage 6 hours 17 minutes.  Set pressure 15 EPR level 2 AHI 5.1 which is mildly elevated leaks 95th percentile at 74.7.  As stated earlier patient claims he has a leaking mask and is due to go by [facility] tomorrow.  Once his leak is fixed  I feel like his AHI will come down.  He returns for reevaluation   09/20/17 CDInterval history for stroke and OSA patient Richard Fernandez from 10 September 2017.  He is here today for a compliance visit after to be some changes in his new CPAP settings, he has been 93% compliant only 2 days of non-use were related to power loss, the wake of Hurricaine Fernandez. His residual AHI is 4.4 which is excellent, he does have very little leaks of air. He endorsed the Epworth sleepiness score at 2 points, fatigue severity, geriatric depression is not endorsed.  I do not need to make any adjustments to Richard Fernandez settings of CPAP. He uses a FFM, is a habitual mouth breather. He loves his new, quiet and travel- friendly CPAP  machine.    Richard Fernandez is a 77 year old male with a history of obstructive sleep apnea on CPAP. At the last visit his download indicated a high residual AHI. Auto titration was ordered for the patient. However his machine does not have the capability to switch to this setting. He was given a auto titration. During the 30 days in autotitration- his download indicates that he uses the machine 26 out of 30 days for compliance of 87%. He uses machine greater than 4 hours each night. On average he uses his machine 7 hours and 58 minutes. He was on a minimum pressure of 5 cm water maximum pressure of 15 cm water with EPR of 2. His residual AHI is 7. His average pressure was 14.6 cm per water. The patient does have a significant leak in the 95th percentile at 35.5 L/m. The patient states that since the titration he has changed mask and has a better seal. He returns today for an evaluation.   REVIEW OF SYSTEMS: Full 14 system review of systems performed and notable only for those listed, all others are neg:  Constitutional: neg  Cardiovascular: neg Ear/Nose/Throat: Hearing loss Skin: neg Eyes: neg Respiratory: neg Gastroitestinal: neg  Hematology/Lymphatic: neg  Endocrine: neg Musculoskeletal: Walking difficulty Allergy/Immunology: neg Neurological: neg Psychiatric: neg Sleep : Obstructive sleep apnea with CPAP   ALLERGIES: No Known Allergies  HOME MEDICATIONS: Outpatient Medications Prior to Visit  Medication Sig Dispense Refill  . B Complex-C (SUPER B COMPLEX PO) Take 1 tablet by mouth daily.    . busPIRone (BUSPAR) 7.5 MG tablet TAKE 1 TABLET BY MOUTH TWICE DAILY 180 tablet 1  . clopidogrel (PLAVIX) 75 MG tablet TAKE 1 TABLET BY MOUTH DAILY 90 tablet 0  . fish oil-omega-3 fatty acids 1000 MG capsule Take 2 g by mouth 2 (two) times daily.     Marland Kitchen FLUoxetine (PROZAC) 10 MG capsule TAKE ONE CAPSULE BY MOUTH DAILY 180 capsule 1  . FLUoxetine (PROZAC) 20 MG capsule TAKE 1 CAPSULE(20 MG) BY  MOUTH DAILY 90 capsule 1  . gabapentin (NEURONTIN) 100 MG capsule TAKE 1 CAPSULE(100 MG) BY MOUTH TWICE DAILY 180 capsule 1  . Hypromellose (ARTIFICIAL TEARS OP) Place 1 drop into both eyes daily as needed (dry eyes).    . Multiple Vitamin (MULTI VITAMIN MENS PO) Take 1 tablet by mouth daily.     Marland Kitchen oxybutynin (DITROPAN-XL) 10 MG 24 hr tablet TK 1 T PO QD  11  . simvastatin (ZOCOR) 40 MG tablet TAKE 1 TABLET BY MOUTH DAILY 90 tablet 0   No facility-administered medications prior to visit.     PAST MEDICAL HISTORY: Past Medical History:  Diagnosis Date  . CEREBROVASCULAR ACCIDENT, HX OF 11/29/2010  . DEPRESSION 11/29/2010  . Hearing loss   . HYPERLIPIDEMIA 11/29/2010  . OBSTRUCTIVE SLEEP APNEA 11/29/2010   CPAP  . PTSD (post-traumatic stress disorder)   . Stroke [facility])     PAST SURGICAL HISTORY: Past Surgical History:  Procedure Laterality Date  . [place]  . HAND SURGERY    . KNEE SURGERY    . SHOULDER ARTHROSCOPY WITH ROTATOR CUFF REPAIR      FAMILY HISTORY: Family History  Adopted: Yes  Problem Relation Age of Onset  . Breast cancer Mother   . Heart failure Father     SOCIAL HISTORY: Social History   Socioeconomic History  . Marital status: Married    Spouse name: [name]   . Number of children: 1  . Years of education: college  . Highest education level: Not on file  Occupational History  . Occupation: part time counselor   Social Needs  . Financial resource strain: Not on file  . Food insecurity:    Worry: Not on file    Inability: Not on file  . Transportation needs:    Medical: Not on file    Non-medical: Not on file  Tobacco Use  . Smoking status: Former Smoker    Packs/day: 1.50    Years: 20.00    Pack years: 30.00    Types: Cigarettes    Last attempt to quit: 11/04/1988    Years since quitting: 29.5  . Smokeless tobacco: Never Used  Substance and Sexual Activity  . Alcohol use: No    Alcohol/week: 0.0 oz  . Drug use: No  . Sexual  activity: Not on file  Lifestyle  . Physical activity:    Days per week: Not on file    Minutes per session: Not on file  . Stress: Not on file  Relationships  . Social connections:    Talks on phone: Not on file    Gets together: Not on file    Attends religious service: Not on file    Active member of club or organization: Not on file    Attends meetings of clubs or organizations: Not on file    Relationship status: Not on file  .  Intimate partner violence:    Fear of current or ex partner: Not on file    Emotionally abused: Not on file    Physically abused: Not on file    Forced sexual activity: Not on file  Other Topics Concern  . Not on file  Social History Narrative   Pts is adopted. Patient lives at home with his [name].   Retired but still working some.   Education some college.   Left handed.   Caffeine four cups of coffee daily.        PHYSICAL EXAM  Vitals:   04/30/18 1254  BP: (!) 112/52  Pulse: 62  Weight: 230 lb 9.6 oz (104.6 kg)  Height: 5\' 8"  (1.727 m)   Body mass index is 35.06 kg/m.  Generalized: Well developed, obese male in no acute distress   Head: normocephalic and atraumatic,. Oropharynx benign mallipati 3 Neck: Supple, circumference 18 Musculoskeletal: No deformity   Neurological examination   Mentation: Alert oriented to time, place, history taking. Attention span and concentration appropriate. Recent and remote memory intact.  Follows all commands speech and language fluent.   Cranial nerve II-XII: Pupils were equal round reactive to light extraocular movements were full, visual field were full on confrontational test. Facial sensation and strength were normal. hearing was intact to finger rubbing bilaterally. Uvula tongue midline. head turning and shoulder shrug were normal and symmetric.Tongue protrusion into cheek strength was normal. Motor: Weaker on the left side due to previous stroke he has a brace on the right leg due to foot drop   Sensory: normal and symmetric to light touch,  Gait and Station: Rising up from seated position without assistance, wide-based stance ambulates with single-point cane no difficulty with turns   DIAGNOSTIC DATA (LABS, IMAGING, TESTING) - I reviewed patient records, labs, notes, testing and imaging myself where available.  Lab Results  Component Value Date   WBC 4.4 10/14/2016   HGB 13.6 10/14/2016   HCT 39.5 10/14/2016   MCV 99.1 10/14/2016   PLT 218.0 10/14/2016      Component Value Date/Time   NA 138 07/09/2017 0902   K 4.2 07/09/2017 0902   CL 101 07/09/2017 0902   CO2 29 07/09/2017 0902   GLUCOSE 101 (H) 07/09/2017 0902   BUN 13 07/09/2017 0902   CREATININE 0.70 07/09/2017 0902   CALCIUM 8.8 07/09/2017 0902   PROT 6.4 07/09/2017 0902   ALBUMIN 3.9 07/09/2017 0902   AST 15 07/09/2017 0902   ALT 14 07/09/2017 0902   ALKPHOS 55 07/09/2017 0902   BILITOT 0.4 07/09/2017 0902   GFRNONAA >60 10/08/2016 0224   GFRAA >60 10/08/2016 0224   Lab Results  Component Value Date   CHOL 160 07/09/2017   HDL 49.90 07/09/2017   LDLCALC 95 07/09/2017   TRIG 75.0 07/09/2017   CHOLHDL 3 07/09/2017   Lab Results  Component Value Date   HGBA1C (H) 05/20/2010    5.8 (NOTE)                                                                       According to the ADA Clinical Practice Recommendations for 2011, when HbA1c is used as a screening test:   >=6.5%   Diagnostic of  Diabetes Mellitus           (if abnormal result  is confirmed)  5.7-6.4%   Increased risk of developing Diabetes Mellitus  References:Diagnosis and Classification of Diabetes Mellitus,Diabetes YNXG,3358,25(PGFQM 1):S62-S69 and Standards of Medical Care in         Diabetes - 2011,Diabetes KJIZ,1281,18  (Suppl 1):S11-S61.   Lab Results  Component Value Date   AQLRJPVG68 159 08/14/2012   Lab Results  Component Value Date   TSH 1.39 01/03/2016      ASSESSMENT AND PLAN  77 y.o. year old male  has a past medical  history of . Obstructive sleep apnea on CPAP, obesity and stroke in 2012.  He is here for compliance with CPAPCPAP data dated 03/30/2018-04/28/2018 shows  compliance greater than 4 hours at 93%.  Average usage 6 hours 28 minutes.  Set pressure 15 cm.  EPR level 2 AHI 3.3 ESS 2.  Compliance data greater than 4 hours 93% Continue same settings Follow-up with Dr. Brett Fairy in November already scheduled Richard Fernandez, [facility], [facility], APRN  [facility] [address] [place], [place] [address] [phone]

## 2018-04-30 ENCOUNTER — Ambulatory Visit: Payer: Medicare Other | Admitting: Nurse Practitioner

## 2018-04-30 ENCOUNTER — Encounter: Payer: Self-pay | Admitting: Nurse Practitioner

## 2018-04-30 VITALS — BP 112/52 | HR 62 | Ht 68.0 in | Wt 230.6 lb

## 2018-04-30 DIAGNOSIS — G4733 Obstructive sleep apnea (adult) (pediatric): Secondary | ICD-10-CM

## 2018-04-30 DIAGNOSIS — M21371 Foot drop, right foot: Secondary | ICD-10-CM | POA: Diagnosis not present

## 2018-04-30 DIAGNOSIS — Z9989 Dependence on other enabling machines and devices: Secondary | ICD-10-CM

## 2018-04-30 NOTE — Patient Instructions (Signed)
Compliance data greater than 4 hours 93% Continue same settings Follow-up yearly

## 2018-05-14 DIAGNOSIS — G4733 Obstructive sleep apnea (adult) (pediatric): Secondary | ICD-10-CM | POA: Diagnosis not present

## 2018-05-22 ENCOUNTER — Other Ambulatory Visit: Payer: Self-pay | Admitting: *Deleted

## 2018-05-22 MED ORDER — FLUOXETINE HCL 20 MG PO CAPS: ORAL_CAPSULE | ORAL | 1 refills | Status: DC

## 2018-06-19 ENCOUNTER — Other Ambulatory Visit: Payer: Self-pay | Admitting: *Deleted

## 2018-06-19 MED ORDER — BUSPIRONE HCL 7.5 MG PO TABS: 7.5000 mg | ORAL_TABLET | Freq: Two times a day (BID) | ORAL | 1 refills | Status: DC

## 2018-07-17 ENCOUNTER — Other Ambulatory Visit: Payer: Self-pay

## 2018-07-20 ENCOUNTER — Other Ambulatory Visit: Payer: Self-pay

## 2018-07-20 MED ORDER — SIMVASTATIN 40 MG PO TABS: 40.0000 mg | ORAL_TABLET | Freq: Every day | ORAL | 0 refills | Status: DC

## 2018-08-03 ENCOUNTER — Telehealth: Payer: Self-pay | Admitting: Neurology

## 2018-08-03 DIAGNOSIS — M21371 Foot drop, right foot: Secondary | ICD-10-CM

## 2018-08-03 DIAGNOSIS — G4733 Obstructive sleep apnea (adult) (pediatric): Secondary | ICD-10-CM

## 2018-08-03 DIAGNOSIS — E669 Obesity, unspecified: Secondary | ICD-10-CM

## 2018-08-03 DIAGNOSIS — Z8673 Personal history of transient ischemic attack (TIA), and cerebral infarction without residual deficits: Secondary | ICD-10-CM

## 2018-08-03 NOTE — Telephone Encounter (Signed)
Pt is having trouble with cpap mask and other things for awhile. He wanted to see Dr D asap, I have put him on the wait list. Pt also said he does not want to see NP ever again, only Dr Brett Fairy.  FYI

## 2018-08-03 NOTE — Telephone Encounter (Signed)
IF patient calls back Rn please ask whats going on with his CIPAP mask, and other things he did not state in previous phone call.   Left vm for patient to call back Rn needs to know whats going on with his mask,and why he does not want to see NP again.

## 2018-08-03 NOTE — Telephone Encounter (Signed)
Would he come in at 4 PM ?

## 2018-08-04 NOTE — Telephone Encounter (Signed)
Dr.Dohmeier pt does not want to speak with RN. I only left a vm and have not spoken to patient. He only wants a call from you see message below.

## 2018-08-04 NOTE — Telephone Encounter (Addendum)
Left message and told patient that if his machine is broken he can get a replacement, if he liked his settings I will copy these to the new order. Encouraged him to call back.  His 2017 sleep study showed an AHI of 59.2/h.   RN/CNA :Please find out how old this current machine is, and ask for download.  CD

## 2018-08-04 NOTE — Addendum Note (Signed)
Addended by: Larey Seat on: 08/04/2018 05:25 PM   Modules accepted: Orders

## 2018-08-04 NOTE — Telephone Encounter (Signed)
Pt has called back and when asked what was going on with his CPAP he requested to speak with Dr Brett Fairy.  Pt states the CPAP is not working, he states it is making noise and keep his wife awake.  Pt states he does not want to speak with RN but instead wants a call back from Dr Brett Fairy

## 2018-08-05 ENCOUNTER — Other Ambulatory Visit: Payer: Self-pay | Admitting: Family Medicine

## 2018-08-05 MED ORDER — CLOPIDOGREL BISULFATE 75 MG PO TABS: 75.0000 mg | ORAL_TABLET | Freq: Every day | ORAL | 0 refills | Status: DC

## 2018-08-05 NOTE — Telephone Encounter (Signed)
Pt returning Dr. Brett Fairy call, did not wish to discuss with but states he has a few questions for the MD. Please advise

## 2018-08-05 NOTE — Telephone Encounter (Signed)
I called and spoke with patient. I offered him an appointment this afternoon with Dr. Brett Fairy but he was currently on his way to [facility]. He reports that he went to [facility] yesterday and they made some adjustments and it seemed to help at first, but after about 4 hours the mask started vibrating and making a very loud noise again. He says that it is not so bothersome to him but it keeps his wife awake. He said that he read about a device for sleep apnea that can be implanted and wondered if Dr. Brett Fairy knew anything about that? I advised him that I would share this with Dr. Brett Fairy. Patient was very appreciative for the call and would still like to discuss with Dr. Brett Fairy if possible. He can be reached at [phone].

## 2018-08-05 NOTE — Telephone Encounter (Addendum)
Yes, that is the INSPIRE procedure, a pacemaker for the tongue muscle , that keeps the tongue toned and unable to slip back and obstruct the airway.  We work with the local [facility]- ENT team on that. Dr. Wilburn Cornelia.  I can refer him only with a sleep study that is not older than 12 month.

## 2018-08-05 NOTE — Telephone Encounter (Signed)
Copied from [place] [id number]. Topic: Quick Communication - See Telephone Encounter >> Aug 05, 2018 10:33 AM Marja Kays F wrote: [facility]  [address] it calling to request a refill on clopidogrel that stated that they requested this before on 08/03/18 and nothing is in the chart   Pharmacy [phone]

## 2018-08-05 NOTE — Telephone Encounter (Signed)
Requested medication (s) are due for refill today:  yes  Requested medication (s) are on the active medication list:  yes  Future visit scheduled:  yes  Last Refill: 04/27/18  Lab work is overdue     Requested Prescriptions  Pending Prescriptions Disp Refills   clopidogrel (PLAVIX) 75 MG tablet 90 tablet 0    Sig: Take 1 tablet (75 mg total) by mouth daily.     Hematology: Antiplatelets - clopidogrel Failed - 08/05/2018 10:56 AM      Failed - Evaluate AST, ALT within 2 months of therapy initiation.      Failed - ALT in normal range and within 360 days    ALT  Date Value Ref Range Status  07/09/2017 14 0 - 53 U/L Final         Failed - AST in normal range and within 360 days    AST  Date Value Ref Range Status  07/09/2017 15 0 - 37 U/L Final         Failed - HCT in normal range and within 180 days    HCT  Date Value Ref Range Status  10/14/2016 39.5 39.0 - 52.0 % Final         Failed - HGB in normal range and within 180 days    Hemoglobin  Date Value Ref Range Status  10/14/2016 13.6 13.0 - 17.0 g/dL Final         Failed - PLT in normal range and within 180 days    Platelets  Date Value Ref Range Status  10/14/2016 218.0 150.0 - 400.0 K/uL Final         Passed - Valid encounter within last 6 months    Recent Outpatient Visits          5 months ago Right foot drop   Therapist, [facility] at [place], MD   6 months ago [place] [facility] [place], MD   6 months ago Daytime somnolence   [facility]rap[facility] at [facility], Alinda Sierras, MD   1 year ago Other hyperlipidemia   Therap[facility] at [facility], Alinda Sierras, MD   1 year ago Left-sided weakness   Therap[facility], [facility] at [facility], Alinda Sierras, MD      Future Appointments            In 3 weeks Burchette, Alinda Sierras, MD [place] [facility] [place], [facility]

## 2018-08-06 NOTE — Telephone Encounter (Signed)
I called and spoke with patient and made him aware of Dr. Edwena Felty message regarding the INSPIRE procedure. He would like to wait for his appt on 09/14/18 to discuss in more detail and he'll decide if he wants to go that route or not.

## 2018-08-14 DIAGNOSIS — G4733 Obstructive sleep apnea (adult) (pediatric): Secondary | ICD-10-CM | POA: Diagnosis not present

## 2018-08-26 ENCOUNTER — Encounter: Payer: Self-pay | Admitting: Family Medicine

## 2018-08-26 ENCOUNTER — Other Ambulatory Visit: Payer: Self-pay

## 2018-08-26 ENCOUNTER — Ambulatory Visit: Payer: Self-pay | Admitting: Family Medicine

## 2018-08-26 ENCOUNTER — Ambulatory Visit (INDEPENDENT_AMBULATORY_CARE_PROVIDER_SITE_OTHER): Payer: Medicare Other | Admitting: Family Medicine

## 2018-08-26 VITALS — BP 102/62 | HR 66 | Temp 97.8°F | Ht 68.0 in | Wt 238.0 lb

## 2018-08-26 DIAGNOSIS — Z8673 Personal history of transient ischemic attack (TIA), and cerebral infarction without residual deficits: Secondary | ICD-10-CM

## 2018-08-26 DIAGNOSIS — M21371 Foot drop, right foot: Secondary | ICD-10-CM | POA: Diagnosis not present

## 2018-08-26 DIAGNOSIS — F330 Major depressive disorder, recurrent, mild: Secondary | ICD-10-CM | POA: Diagnosis not present

## 2018-08-26 DIAGNOSIS — M21372 Foot drop, left foot: Secondary | ICD-10-CM | POA: Diagnosis not present

## 2018-08-26 DIAGNOSIS — E7849 Other hyperlipidemia: Secondary | ICD-10-CM | POA: Diagnosis not present

## 2018-08-26 LAB — BASIC METABOLIC PANEL
BUN: 15 mg/dL (ref 6–23)
CHLORIDE: 100 meq/L (ref 96–112)
CO2: 29 meq/L (ref 19–32)
CREATININE: 0.71 mg/dL (ref 0.40–1.50)
Calcium: 8.7 mg/dL (ref 8.4–10.5)
GFR: 114.23 mL/min (ref >60.00)
Glucose, Bld: 92 mg/dL (ref 70–99)
POTASSIUM: 4.4 meq/L (ref 3.5–5.1)
Sodium: 136 mEq/L (ref 135–145)

## 2018-08-26 LAB — LIPID PANEL
CHOL/HDL RATIO: 3
Cholesterol: 168 mg/dL (ref 0–200)
HDL: 51.3 mg/dL (ref >39.00)
LDL Cholesterol: 101 mg/dL — ABNORMAL HIGH (ref 0–99)
NONHDL: 117.12
Triglycerides: 79 mg/dL (ref 0.0–149.0)
VLDL: 15.8 mg/dL (ref 0.0–40.0)

## 2018-08-26 LAB — HEPATIC FUNCTION PANEL
ALK PHOS: 52 U/L (ref 39–117)
ALT: 18 U/L (ref 0–53)
AST: 16 U/L (ref 0–37)
Albumin: 4 g/dL (ref 3.5–5.2)
BILIRUBIN DIRECT: 0.1 mg/dL (ref 0.0–0.3)
BILIRUBIN TOTAL: 0.5 mg/dL (ref 0.2–1.2)
Total Protein: 6.7 g/dL (ref 6.0–8.3)

## 2018-08-26 MED ORDER — FLUOXETINE HCL 40 MG PO CAPS: 40.0000 mg | ORAL_CAPSULE | Freq: Every day | ORAL | 1 refills | Status: DC

## 2018-08-26 NOTE — Progress Notes (Signed)
  Subjective:     Patient ID: Richard Fernandez, male   DOB: 11-13-1940, 77 y.o.   MRN: [id number]  HPI Patient has multiple chronic problems including past history of CVA, posttraumatic stress disorder, obesity, hyperlipidemia, history of obstructive sleep apnea.  Several issues discussed as below  He has hyperlipidemia which is treated with simvastatin.  Due for follow-up labs.  No myalgias.  He has history of CVA.  His blood pressures have consistently been well controlled.  He remains on Plavix.  He does have history of foot drop bilaterally and has AFO braces on bilaterally.  He states he had some progressive issues with left lower extremity foot drop and wonders if this may be a bracing issue.  He plans to follow-up soon with his orthopedic supplier  Occasional tingling sensation left hand.  No neck pain.  He states his had some persistent weakness since his stroke.  History of recurrent depression.  He currently is on Prozac 30 mg daily.  He is requesting that we titrate this to 40.  He still has some sad mood intermittently.  No suicidal ideation.  Motivation fair.     Past Medical History:  Diagnosis Date  . CEREBROVASCULAR ACCIDENT, HX OF 11/29/2010  . DEPRESSION 11/29/2010  . Hearing loss   . HYPERLIPIDEMIA 11/29/2010  . OBSTRUCTIVE SLEEP APNEA 11/29/2010   CPAP  . PTSD (post-traumatic stress disorder)   . Stroke [facility])    Past Surgical History:  Procedure Laterality Date  . [place]  . HAND SURGERY    . KNEE SURGERY    . SHOULDER ARTHROSCOPY WITH ROTATOR CUFF REPAIR      reports that he quit smoking about 29 years ago. His smoking use included cigarettes. He has a 30.00 pack-year smoking history. He has never used smokeless tobacco. He reports that he does not drink alcohol or use drugs. family history includes Breast cancer in his mother; Heart failure in his father. He was adopted. No Known Allergies   Review of Systems  Constitutional: Negative for fatigue.  Eyes:  Negative for visual disturbance.  Respiratory: Negative for cough, chest tightness and shortness of breath.   Cardiovascular: Negative for chest pain, palpitations and leg swelling.  Neurological: Negative for dizziness, syncope, weakness, light-headedness and headaches.       Objective:   Physical Exam  Constitutional: He is oriented to person, place, and time. He appears well-developed and well-nourished.  HENT:  Right Ear: External ear normal.  Left Ear: External ear normal.  Mouth/Throat: Oropharynx is clear and moist.  Eyes: Pupils are equal, round, and reactive to light.  Neck: Neck supple. No thyromegaly present.  Cardiovascular: Normal rate and regular rhythm.  Pulmonary/Chest: Effort normal and breath sounds normal. No respiratory distress. He has no wheezes. He has no rales.  Musculoskeletal: He exhibits no edema.  Neurological: He is alert and oriented to person, place, and time.       Assessment:     #1 hyperlipidemia.  Goal LDL less than 70 with prior history of CVA  #2 history of recurrent depression  #3 chronic foot drop bilaterally    Plan:     -Flu vaccine already given -Increase Prozac to 40 mg daily and reassess in 3 to 4 weeks -Check labs with lipid panel, hepatic panel, basic metabolic panel  [name] MD  [facility]

## 2018-09-03 ENCOUNTER — Other Ambulatory Visit: Payer: Self-pay | Admitting: Family Medicine

## 2018-09-09 ENCOUNTER — Encounter: Payer: Self-pay | Admitting: Neurology

## 2018-09-14 ENCOUNTER — Ambulatory Visit: Payer: Medicare Other | Admitting: Neurology

## 2018-09-14 ENCOUNTER — Encounter: Payer: Self-pay | Admitting: Neurology

## 2018-09-14 ENCOUNTER — Encounter

## 2018-09-14 VITALS — BP 104/58 | HR 66 | Ht 68.0 in | Wt 230.5 lb

## 2018-09-14 DIAGNOSIS — G4733 Obstructive sleep apnea (adult) (pediatric): Secondary | ICD-10-CM | POA: Diagnosis not present

## 2018-09-14 DIAGNOSIS — Z9989 Dependence on other enabling machines and devices: Secondary | ICD-10-CM
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
## 2018-09-14 NOTE — Patient Instructions (Signed)

## 2018-09-21 ENCOUNTER — Ambulatory Visit: Payer: Medicare Other | Admitting: Podiatry

## 2018-09-21 DIAGNOSIS — L603 Nail dystrophy: Secondary | ICD-10-CM | POA: Diagnosis not present

## 2018-09-21 DIAGNOSIS — M79675 Pain in left toe(s): Secondary | ICD-10-CM | POA: Diagnosis not present

## 2018-09-24 DIAGNOSIS — D2239 Melanocytic nevi of other parts of face: Secondary | ICD-10-CM | POA: Diagnosis not present

## 2018-09-24 DIAGNOSIS — L570 Actinic keratosis: Secondary | ICD-10-CM | POA: Diagnosis not present

## 2018-09-27 NOTE — Progress Notes (Signed)
Subjective:   Patient ID: Richard Fernandez, male   DOB: 77 y.o.   MRN: [id number]   HPI 77 year old male presents the office today for concerns of his left big toenail becoming thickened discolored and very long is causing pressure into his second toe needs to have the nail trimmed.  There is been a chronic issue for him.  He has no pain in the actual nail he denies any redness or drainage or any swelling.  He is not able to remove the shoe on the right side because the brace I cannot evaluate the right foot.  He has no other issues today.  Review of Systems  All other systems reviewed and are negative.  Past Medical History:  Diagnosis Date  . CEREBROVASCULAR ACCIDENT, HX OF 11/29/2010  . DEPRESSION 11/29/2010  . Hearing loss   . HYPERLIPIDEMIA 11/29/2010  . OBSTRUCTIVE SLEEP APNEA 11/29/2010   CPAP  . PTSD (post-traumatic stress disorder)   . Stroke [facility])     Past Surgical History:  Procedure Laterality Date  . [place]  . HAND SURGERY    . KNEE SURGERY    . SHOULDER ARTHROSCOPY WITH ROTATOR CUFF REPAIR       Current Outpatient Medications:  .  B Complex-C (SUPER B COMPLEX PO), Take 1 tablet by mouth daily., Disp: , Rfl:  .  busPIRone (BUSPAR) 7.5 MG tablet, Take 1 tablet (7.5 mg total) by mouth 2 (two) times daily., Disp: 180 tablet, Rfl: 1 .  clopidogrel (PLAVIX) 75 MG tablet, TAKE 1 TABLET BY MOUTH EVERY DAY, Disp: 90 tablet, Rfl: 1 .  fish oil-omega-3 fatty acids 1000 MG capsule, Take 2 g by mouth 2 (two) times daily. , Disp: , Rfl:  .  FLUoxetine (PROZAC) 40 MG capsule, Take 1 capsule (40 mg total) by mouth daily., Disp: 90 capsule, Rfl: 1 .  gabapentin (NEURONTIN) 100 MG capsule, TAKE 1 CAPSULE(100 MG) BY MOUTH TWICE DAILY, Disp: 180 capsule, Rfl: 1 .  Hypromellose (ARTIFICIAL TEARS OP), Place 1 drop into both eyes daily as needed (dry eyes)., Disp: , Rfl:  .  Multiple Vitamin (MULTI VITAMIN MENS PO), Take 1 tablet by mouth daily. , Disp: , Rfl:  .  oxybutynin  (DITROPAN-XL) 10 MG 24 hr tablet, TK 1 T PO QD, Disp: , Rfl: 11 .  simvastatin (ZOCOR) 40 MG tablet, Take 1 tablet (40 mg total) by mouth daily., Disp: 90 tablet, Rfl: 0  No Known Allergies      Objective:  Physical Exam  General: AAO x3, NAD  Dermatological: The left hallux toenail significantly hypertrophic, dystrophic with yellow to brown discoloration and laterally and hitting the second toe.  There is no pain in the nail there is no surrounding redness or drainage or any signs of infection.  No open lesions present left foot.  I cannot evaluate the right foot.  Vascular: Dorsalis Pedis artery and Posterior Tibial artery pedal pulses are 2/4 with immedate capillary fill time. There is no pain with calf compression, swelling, warmth, erythema.   Neruologic: Grossly intact via light touch bilateral.. Protective threshold with Semmes Wienstein monofilament intact to all pedal sites bilateral.   Musculoskeletal: Dropfoot present.  Muscular strength 5/5 in all groups tested bilateral.     Assessment:   Left hallux symptomatic onychodystrophy/onychomycosis     Plan:  -Treatment options discussed including all alternatives, risks, and complications -Etiology of symptoms were discussed -I sharply debrided the left hallux toe without any complications or bleeding.  If  symptoms continue discussed total nail avulsion with chemical matricectomy.  He states he will proceed with this nail debridement today.  Monitoring signs or symptoms of infection.  Trula Slade DPM

## 2018-10-12 ENCOUNTER — Other Ambulatory Visit: Payer: Self-pay | Admitting: Family Medicine

## 2018-10-23 ENCOUNTER — Other Ambulatory Visit: Payer: Self-pay | Admitting: Family Medicine

## 2018-10-23 MED ORDER — GABAPENTIN 100 MG PO CAPS: ORAL_CAPSULE | ORAL | 1 refills | Status: DC

## 2018-10-23 NOTE — Telephone Encounter (Signed)
Requested Prescriptions  Pending Prescriptions Disp Refills  . gabapentin (NEURONTIN) 100 MG capsule 180 capsule 1     Neurology: Anticonvulsants - gabapentin Passed - 10/23/2018  9:13 AM      Passed - Valid encounter within last 12 months    Recent Outpatient Visits          1 month ago Other hyperlipidemia   Therapist, [facility] at [facility], Alinda Sierras, MD   8 months ago Right foot drop   Therapist, [facility] at [facility], Alinda Sierras, MD   8 months ago [place] [facility] [place], [place]   9 months ago Daytime somnolence   Therapist, music at [facility], Alinda Sierras, MD   1 year ago Other hyperlipidemia   Therapist, music at [facility], Alinda Sierras, MD

## 2018-10-23 NOTE — Telephone Encounter (Signed)
Copied from [place] [id number]. Topic: Quick Communication - Rx Refill/Question >> Oct 23, 2018  8:36 AM Carolyn Stare wrote: Medication     gabapentin (NEURONTIN) 100 MG capsule Has the patient contacted their pharmacy  yes    Preferred Pharmacy    [facility]: Please be advised that RX refills may take up to 3 business days. We ask that you follow-up with your pharmacy.

## 2018-11-05 DIAGNOSIS — H35372 Puckering of macula, left eye: Secondary | ICD-10-CM | POA: Diagnosis not present

## 2018-11-05 DIAGNOSIS — H25013 Cortical age-related cataract, bilateral: Secondary | ICD-10-CM | POA: Diagnosis not present

## 2018-11-05 DIAGNOSIS — H2513 Age-related nuclear cataract, bilateral: Secondary | ICD-10-CM | POA: Diagnosis not present

## 2018-11-05 DIAGNOSIS — H40013 Open angle with borderline findings, low risk, bilateral: Secondary | ICD-10-CM | POA: Diagnosis not present

## 2018-11-06 DIAGNOSIS — D485 Neoplasm of uncertain behavior of skin: Secondary | ICD-10-CM | POA: Diagnosis not present

## 2018-11-06 DIAGNOSIS — L821 Other seborrheic keratosis: Secondary | ICD-10-CM | POA: Diagnosis not present

## 2018-11-06 DIAGNOSIS — L570 Actinic keratosis: Secondary | ICD-10-CM | POA: Diagnosis not present

## 2018-12-06 ENCOUNTER — Other Ambulatory Visit: Payer: Self-pay | Admitting: Family Medicine

## 2018-12-23 DIAGNOSIS — G4733 Obstructive sleep apnea (adult) (pediatric): Secondary | ICD-10-CM | POA: Diagnosis not present

## 2019-01-12 ENCOUNTER — Other Ambulatory Visit: Payer: Self-pay | Admitting: Family Medicine

## 2019-01-14 ENCOUNTER — Encounter (INDEPENDENT_AMBULATORY_CARE_PROVIDER_SITE_OTHER): Payer: Medicare Other

## 2019-01-14 ENCOUNTER — Other Ambulatory Visit: Payer: Self-pay

## 2019-01-25 ENCOUNTER — Ambulatory Visit (INDEPENDENT_AMBULATORY_CARE_PROVIDER_SITE_OTHER): Payer: Medicare Other | Admitting: Bariatrics

## 2019-01-25 ENCOUNTER — Encounter (INDEPENDENT_AMBULATORY_CARE_PROVIDER_SITE_OTHER): Payer: Self-pay

## 2019-02-04 DIAGNOSIS — G4733 Obstructive sleep apnea (adult) (pediatric): Secondary | ICD-10-CM | POA: Diagnosis not present

## 2019-02-08 ENCOUNTER — Ambulatory Visit (INDEPENDENT_AMBULATORY_CARE_PROVIDER_SITE_OTHER): Payer: Medicare Other | Admitting: Bariatrics

## 2019-02-15 ENCOUNTER — Ambulatory Visit (INDEPENDENT_AMBULATORY_CARE_PROVIDER_SITE_OTHER): Payer: Medicare Other | Admitting: Family Medicine

## 2019-02-15 ENCOUNTER — Other Ambulatory Visit: Payer: Self-pay

## 2019-02-15 ENCOUNTER — Other Ambulatory Visit: Payer: Self-pay | Admitting: Family Medicine

## 2019-02-15 DIAGNOSIS — F330 Major depressive disorder, recurrent, mild: Secondary | ICD-10-CM

## 2019-02-15 MED ORDER — FLUOXETINE HCL 40 MG PO CAPS: 40.0000 mg | ORAL_CAPSULE | Freq: Every day | ORAL | 3 refills | Status: DC

## 2019-02-15 NOTE — Progress Notes (Signed)
Patient ID: Richard Fernandez, male   DOB: 05/31/1941, 78 y.o.   MRN: [id number]  Virtual Visit via Video Note  I connected with Richard Fernandez on 02/15/19 at  9:45 AM EDT by a video enabled telemedicine application and verified that I am speaking with the correct person using two identifiers.  Location patient: home Location provider:work or home office Persons participating in the virtual visit: patient, provider  I discussed the limitations of evaluation and management by telemedicine and the availability of in person appointments. The patient expressed understanding and agreed to proceed.   HPI: Patient has history of obstructive sleep apnea, recurrent depression, hyperlipidemia, history of CVA, history of posttraumatic stress disorder.  He is requesting refills of fluoxetine.  Currently takes 40 mg daily.  He feels his depression is relatively stable.  Anxiety symptoms are stable.  He had labs last fall and lipids were stable.  Denies any recent chest pains.  No appetite or weight changes.  He is staying very isolated with current coronavirus pandemic.  He and his wife have had no fever or cough   ROS: See pertinent positives and negatives per HPI.  Past Medical History:  Diagnosis Date  . CEREBROVASCULAR ACCIDENT, HX OF 11/29/2010  . DEPRESSION 11/29/2010  . Hearing loss   . HYPERLIPIDEMIA 11/29/2010  . OBSTRUCTIVE SLEEP APNEA 11/29/2010   CPAP  . PTSD (post-traumatic stress disorder)   . Stroke [facility])     Past Surgical History:  Procedure Laterality Date  . [place]  . HAND SURGERY    . KNEE SURGERY    . SHOULDER ARTHROSCOPY WITH ROTATOR CUFF REPAIR      Family History  Adopted: Yes  Problem Relation Age of Onset  . Breast cancer Mother   . Heart failure Father     SOCIAL HX: Non-smoker.   Current Outpatient Medications:  .  B Complex-C (SUPER B COMPLEX PO), Take 1 tablet by mouth daily., Disp: , Rfl:  .  busPIRone (BUSPAR) 7.5 MG tablet, TAKE 1 TABLET (7.5 MG  TOTAL) BY MOUTH 2 (TWO) TIMES DAILY., Disp: 180 tablet, Rfl: 1 .  clopidogrel (PLAVIX) 75 MG tablet, TAKE 1 TABLET BY MOUTH EVERY DAY, Disp: 90 tablet, Rfl: 1 .  fish oil-omega-3 fatty acids 1000 MG capsule, Take 2 g by mouth 2 (two) times daily. , Disp: , Rfl:  .  FLUoxetine (PROZAC) 40 MG capsule, Take 1 capsule (40 mg total) by mouth daily., Disp: 90 capsule, Rfl: 3 .  gabapentin (NEURONTIN) 100 MG capsule, TAKE 1 CAPSULE(100 MG) BY MOUTH TWICE DAILY, Disp: 180 capsule, Rfl: 1 .  Hypromellose (ARTIFICIAL TEARS OP), Place 1 drop into both eyes daily as needed (dry eyes)., Disp: , Rfl:  .  Multiple Vitamin (MULTI VITAMIN MENS PO), Take 1 tablet by mouth daily. , Disp: , Rfl:  .  oxybutynin (DITROPAN-XL) 10 MG 24 hr tablet, TK 1 T PO QD, Disp: , Rfl: 11 .  simvastatin (ZOCOR) 40 MG tablet, TAKE 1 TABLET BY MOUTH EVERY DAY, Disp: 90 tablet, Rfl: 2  EXAM:  VITALS per patient if applicable:  GENERAL: alert, oriented, appears well and in no acute distress  HEENT: atraumatic, conjunttiva clear, no obvious abnormalities on inspection of external nose and ears  NECK: normal movements of the head and neck  LUNGS: on inspection no signs of respiratory distress, breathing rate appears normal, no obvious gross SOB, gasping or wheezing  CV: no obvious cyanosis  MS: moves all visible extremities without noticeable abnormality  PSYCH/NEURO: pleasant and cooperative, no obvious depression or anxiety, speech and thought processing grossly intact  ASSESSMENT AND PLAN:  Discussed the following assessment and plan:  Mild episode of recurrent major depressive disorder (HCC)-denies any active depression symptoms -Refilled fluoxetine for 1 year -Hopefully can get back into the office for follow-up by this fall and repeat lab work then     I discussed the assessment and treatment plan with the patient. The patient was provided an opportunity to ask questions and all were answered. The patient agreed  with the plan and demonstrated an understanding of the instructions.   The patient was advised to call back or seek an in-person evaluation if the symptoms worsen or if the condition fails to improve as anticipated   Carolann Littler, MD

## 2019-02-15 NOTE — Telephone Encounter (Signed)
Patient has a follow up scheduled today for this medication at 9:45am.

## 2019-02-15 NOTE — Telephone Encounter (Signed)
Refills sent with visit today.

## 2019-03-20 ENCOUNTER — Other Ambulatory Visit: Payer: Self-pay | Admitting: Family Medicine

## 2019-04-10 ENCOUNTER — Other Ambulatory Visit: Payer: Self-pay | Admitting: Family Medicine

## 2019-04-20 ENCOUNTER — Telehealth: Payer: Self-pay

## 2019-04-20 ENCOUNTER — Other Ambulatory Visit: Payer: Self-pay

## 2019-04-20 MED ORDER — CLOPIDOGREL BISULFATE 75 MG PO TABS: 75.0000 mg | ORAL_TABLET | Freq: Every day | ORAL | 1 refills | Status: DC

## 2019-04-20 NOTE — Telephone Encounter (Signed)
Copied from [place] [id number]. Topic: Quick Communication - Rx Refill/Question >> Apr 19, 2019 10:56 AM Scherrie Gerlach wrote: Medication: clopidogrel (PLAVIX) 75 MG tablet  This med was sent to wrong pharmacy. Pt would like you to resend to  [place], [address] AT [address]

## 2019-04-20 NOTE — Telephone Encounter (Signed)
This prescription has been sent to the correct pharmacy requested by patient.

## 2019-04-29 DIAGNOSIS — G4733 Obstructive sleep apnea (adult) (pediatric): Secondary | ICD-10-CM | POA: Diagnosis not present

## 2019-06-01 ENCOUNTER — Telehealth: Payer: Self-pay | Admitting: Neurology

## 2019-06-01 NOTE — Telephone Encounter (Signed)
I have sent a message to the management that I have for [facility]. Will see what they say.

## 2019-06-01 NOTE — Telephone Encounter (Signed)
Pt called in and stated he needs help with his CPAP supplies he states his machine is acting up and he has called [facility] and he has been waiting on a call back for 24 hours and still hasnt heard anything

## 2019-06-02 NOTE — Telephone Encounter (Signed)
Pt is requesting a call back to discuss machine

## 2019-06-02 NOTE — Telephone Encounter (Signed)
Called the patient back and he is asking if we can look into transferring care for CPAP supplies. He is not satisfied and states that the masks he gets are not holding up. Advised the pt that I will send over information and see if another company can take him over. Pt verbalized understanding and was apprecaitive

## 2019-06-07 ENCOUNTER — Telehealth: Payer: Self-pay | Admitting: Neurology

## 2019-06-07 ENCOUNTER — Other Ambulatory Visit: Payer: Self-pay | Admitting: Neurology

## 2019-06-07 DIAGNOSIS — G4733 Obstructive sleep apnea (adult) (pediatric): Secondary | ICD-10-CM

## 2019-06-07 NOTE — Telephone Encounter (Signed)
Called the patient and advised that I have transferred over to [place] for him. Advised they should be contacting him soon to discuss when he should be due for shipment. I gave him their number in case he hasnt heard from them, he can give them a call. Patient was appreciative for the call.

## 2019-06-07 NOTE — Telephone Encounter (Signed)
Pt called wanting to know which cpap company he needs to call since they have not reached out to him. Please advise.

## 2019-06-28 ENCOUNTER — Other Ambulatory Visit: Payer: Self-pay

## 2019-06-28 ENCOUNTER — Encounter: Payer: Self-pay | Admitting: Family Medicine

## 2019-06-28 ENCOUNTER — Ambulatory Visit (INDEPENDENT_AMBULATORY_CARE_PROVIDER_SITE_OTHER): Payer: Medicare Other | Admitting: Family Medicine

## 2019-06-28 VITALS — BP 128/64 | HR 67 | Temp 98.1°F | Ht 68.0 in | Wt 236.1 lb

## 2019-06-28 DIAGNOSIS — E7849 Other hyperlipidemia: Secondary | ICD-10-CM | POA: Diagnosis not present

## 2019-06-28 DIAGNOSIS — L989 Disorder of the skin and subcutaneous tissue, unspecified: Secondary | ICD-10-CM

## 2019-06-28 DIAGNOSIS — Z23 Encounter for immunization: Secondary | ICD-10-CM | POA: Diagnosis not present

## 2019-06-28 MED ORDER — MUPIROCIN 2 % EX OINT: 1.0000 "application " | TOPICAL_OINTMENT | Freq: Two times a day (BID) | CUTANEOUS | 0 refills | Status: DC

## 2019-06-28 NOTE — Progress Notes (Signed)
  Subjective:     Patient ID: Richard Fernandez, male   DOB: 09-05-1941, 78 y.o.   MRN: [id number]  HPI Patient is seen for skin lesion left cheek which she states is been presently 3 weeks.  He has reported history of rosacea and thought that somehow the rosacea had caused some bleeding on his face.  He denies any injury.  The area that is scabbed over is somewhat sore to touch.  No reported history of prior skin cancer.  His chronic problems include obstructive sleep apnea, hyperlipidemia, history of recurrent depression, remote history of stroke, history of posttraumatic stress disorder, bipolar 1 disorder.  Overall doing fairly well.  He continues to sit with patients.  He remains on simvastatin 40 mg daily for hyperlipidemia.  Last lipids were last October.  He is nonfasting today.  Compliant with therapy.  No significant myalgias.  Past Medical History:  Diagnosis Date  . CEREBROVASCULAR ACCIDENT, HX OF 11/29/2010  . DEPRESSION 11/29/2010  . Hearing loss   . HYPERLIPIDEMIA 11/29/2010  . OBSTRUCTIVE SLEEP APNEA 11/29/2010   CPAP  . PTSD (post-traumatic stress disorder)   . Stroke [facility])    Past Surgical History:  Procedure Laterality Date  . [place]  . HAND SURGERY    . KNEE SURGERY    . SHOULDER ARTHROSCOPY WITH ROTATOR CUFF REPAIR      reports that he quit smoking about 30 years ago. His smoking use included cigarettes. He has a 30.00 pack-year smoking history. He has never used smokeless tobacco. He reports that he does not drink alcohol or use drugs. family history includes Breast cancer in his mother; Heart failure in his father. He was adopted. No Known Allergies   Review of Systems  Constitutional: Negative for appetite change and unexpected weight change.  Respiratory: Negative for cough and shortness of breath.   Cardiovascular: Negative for chest pain.  Hematological: Negative for adenopathy.       Objective:   Physical Exam Constitutional:    Appearance: Normal appearance.  Cardiovascular:     Rate and Rhythm: Normal rate and regular rhythm.  Pulmonary:     Effort: Pulmonary effort is normal.     Breath sounds: Normal breath sounds.  Skin:    Comments: Patient has a skin lesion left cheek with thickened scabbed area which is over 1 cm in length and about 7 to 8 mm in diameter.  Neurological:     Mental Status: He is alert.        Assessment:     #1 irregular skin lesion left cheek.  Concern is whether this is squamous cell carcinoma.  #2 hyperlipidemia.  Patient on simvastatin.  Return in a few months for fasting lipids and medical follow-up    Plan:     -Patient requesting tetanus booster and this has been over 10 years since last -Set up referral to skin surgery Center for further evaluation. -We gave him some topical Bactroban ointment to use twice daily  [name] MD [facility] at [facility]

## 2019-06-28 NOTE — Patient Instructions (Signed)
We are setting up dermatology referral.   Need to rule out squamous cell skin cancer.

## 2019-06-28 NOTE — Addendum Note (Signed)
Addended by: Anibal Henderson on: 06/28/2019 05:31 PM   Modules accepted: Orders

## 2019-06-30 ENCOUNTER — Other Ambulatory Visit: Payer: Self-pay | Admitting: Family Medicine

## 2019-07-01 ENCOUNTER — Telehealth: Payer: Self-pay | Admitting: Family Medicine

## 2019-07-01 NOTE — Telephone Encounter (Signed)
Pt called in to update provider. Pt says that he received call from [facility] (referral location) pt says that he was told that they cant see him until the end of October. Pt would like to know if someone could assist him with getting set up with a different location that could see him sooner?    Please assist.

## 2019-07-01 NOTE — Telephone Encounter (Signed)
OK to refill for 6 months 

## 2019-07-02 DIAGNOSIS — G4733 Obstructive sleep apnea (adult) (pediatric): Secondary | ICD-10-CM | POA: Diagnosis not present

## 2019-07-02 NOTE — Telephone Encounter (Signed)
°  I sent the referral to [facility] center to see if the pt can get a sooner appt  Their office will contact pt to schedule directly   Copied from [place] [id number]. Topic: Referral - Status >> Jul 01, 2019  1:11 PM Berneta Levins wrote: Reason for CRM:   Elmo Putt with [facility] calling to say that they do not have urgent availability for evaluations.  First new pt appts are at the end of October.  They did reach out to the pt, but they were not comfortable waiting that long.  Elmo Putt can be reached at [phone]

## 2019-07-05 DIAGNOSIS — L989 Disorder of the skin and subcutaneous tissue, unspecified: Secondary | ICD-10-CM | POA: Diagnosis not present

## 2019-07-05 DIAGNOSIS — C4491 Basal cell carcinoma of skin, unspecified: Secondary | ICD-10-CM | POA: Diagnosis not present

## 2019-07-05 DIAGNOSIS — L570 Actinic keratosis: Secondary | ICD-10-CM | POA: Diagnosis not present

## 2019-07-29 DIAGNOSIS — C44310 Basal cell carcinoma of skin of unspecified parts of face: Secondary | ICD-10-CM | POA: Diagnosis not present

## 2019-07-29 DIAGNOSIS — G4733 Obstructive sleep apnea (adult) (pediatric): Secondary | ICD-10-CM | POA: Diagnosis not present

## 2019-08-02 ENCOUNTER — Telehealth: Payer: Self-pay | Admitting: Neurology

## 2019-08-02 NOTE — Telephone Encounter (Signed)
Pt is asking for a call from RN to discuss a medication to prevent being restless during the day, please call

## 2019-08-02 NOTE — Telephone Encounter (Signed)
Called pt back. There was no answer. LVM informing the pt to call back to get more information on what he was calling back. Advised for pt to call back. If I am unable to get it I will call patient back..  If pt calls back and I am unable to take please ask more details in reference to the restlessness he mentioned. Is it restlessness in general or restlessness in his extremities. If I can get more detail as to what his concerns are then I can discuss with Dr Brett Fairy as to what she would recommend for the pt.

## 2019-08-03 NOTE — Telephone Encounter (Signed)
Patient has called back and states that he just wanted to bring to Dr Dohmeier's attention that recently he has found that he can nod off and nap in unexpected places. He states he finds that it is becoming more frequent. I reviewed the patient's cpap download and apnea is well treated. AHI events are 1.2 averaging over the last 30 days. Patient is asking if there is a medication that he can take to help with the daytime fatigue he is experiencing. Advised I would discuss with Dr Brett Fairy and message with him with her recommendations. Confirmed the pharmacy on file.

## 2019-08-03 NOTE — Addendum Note (Signed)
Addended by: Darleen Crocker on: 08/03/2019 05:02 PM   Modules accepted: Orders

## 2019-08-04 ENCOUNTER — Encounter: Payer: Self-pay | Admitting: Neurology

## 2019-08-04 MED ORDER — MODAFINIL 100 MG PO TABS: 100.0000 mg | ORAL_TABLET | Freq: Every day | ORAL | 5 refills | Status: DC | PRN

## 2019-08-04 NOTE — Telephone Encounter (Signed)
Dr Dohmeier and I discussed and she is ok with the patient getting a mediation called modafinil at low dose 100 mg to take in the morning as needed. Script was entered and will send to the pharmacy for the patient.

## 2019-08-04 NOTE — Addendum Note (Signed)
Addended by: Darleen Crocker on: 08/04/2019 07:54 AM   Modules accepted: Orders

## 2019-08-05 ENCOUNTER — Telehealth: Payer: Self-pay

## 2019-08-05 ENCOUNTER — Telehealth: Payer: Self-pay | Admitting: Neurology

## 2019-08-05 ENCOUNTER — Encounter: Payer: Self-pay | Admitting: Neurology

## 2019-08-05 NOTE — Telephone Encounter (Signed)
Pt called wanting to discuss medications with the RN because the medication that was prescribed to him for his sleep apnea is to expensive. Please advise.

## 2019-08-05 NOTE — Telephone Encounter (Signed)
Completed a PA for the patient through cover my meds, [employer] medicare form.  [id number]. [employer] was unable to respond with questions and gave number to call. Called [phone] Spoke with Jenny Reichmann in the PA department and completed PA online. PA completed and approved until 01/03/2020.

## 2019-08-05 NOTE — Telephone Encounter (Signed)
Copied from [place] [id number] [id number]. Topic: General - Other >> Aug 05, 2019  9:51 AM Reyne Dumas L wrote: Reason for CRM:   Pt states that he was just measured for a new brace and needs to give that information to the PCP so that a RX can be sent for him to have the brace made. Pt can be reached at [phone].

## 2019-08-05 NOTE — Telephone Encounter (Signed)
Pt calling back to check status. Please advise  °

## 2019-08-05 NOTE — Telephone Encounter (Signed)
Sent a mychart message to the patient making him aware that the medication was approved by his insurance. Advised the patient to have pharmacy re run medication again.

## 2019-08-09 NOTE — Telephone Encounter (Signed)
Order has been faxed. Patient is aware. Nothing further needed.

## 2019-08-09 NOTE — Telephone Encounter (Signed)
°  Relation to pt: self Call back number: [phone]   Reason for call:  Patient requesting orders for custom ground reaction afo brace please fax to [facility] fax [phone] phone # [phone]. Patient scheduled for a fitting on 08/17/2019. Patient would like a follow up call when orders have been faxed.

## 2019-08-09 NOTE — Telephone Encounter (Signed)
Order written

## 2019-08-11 ENCOUNTER — Other Ambulatory Visit: Payer: Self-pay

## 2019-08-11 ENCOUNTER — Ambulatory Visit (INDEPENDENT_AMBULATORY_CARE_PROVIDER_SITE_OTHER): Payer: Medicare Other | Admitting: Family Medicine

## 2019-08-11 ENCOUNTER — Encounter: Payer: Self-pay | Admitting: Family Medicine

## 2019-08-11 VITALS — BP 128/60 | HR 58 | Temp 98.3°F | Wt 234.5 lb

## 2019-08-11 DIAGNOSIS — M21371 Foot drop, right foot: Secondary | ICD-10-CM | POA: Diagnosis not present

## 2019-08-11 DIAGNOSIS — C4492 Squamous cell carcinoma of skin, unspecified: Secondary | ICD-10-CM

## 2019-08-11 DIAGNOSIS — M21372 Foot drop, left foot: Secondary | ICD-10-CM | POA: Diagnosis not present

## 2019-08-11 NOTE — Progress Notes (Signed)
  Subjective:     Patient ID: Richard Fernandez, male   DOB: 05/15/1941, 78 y.o.   MRN: [id number]  HPI Patient here to discuss the following issues  He had recent ulcerative nonhealing lesion left face.  Our concern was for skin cancer.  We sent him to dermatology.  He went in to see someone over in [place] and had biopsy which he states confirm squamous cell carcinoma.  He was prescribed imiquimod cream and after using this for 2 to 3 days had significant caustic reaction-as predicted.  Has had significant pain since then.  He actually has follow-up appointment with skin surgery center October 22.  He also called recently for a new brace for his left knee.  He has history of bilateral foot drop and has seen someone for orthotics and braces previously and they had recommended a specific type of brace.  He has intermittent weakness in the left knee and occasionally states that his leg "gives way ".  Past Medical History:  Diagnosis Date  . CEREBROVASCULAR ACCIDENT, HX OF 11/29/2010  . DEPRESSION 11/29/2010  . Hearing loss   . HYPERLIPIDEMIA 11/29/2010  . OBSTRUCTIVE SLEEP APNEA 11/29/2010   CPAP  . PTSD (post-traumatic stress disorder)   . Stroke [facility])    Past Surgical History:  Procedure Laterality Date  . [place]  . HAND SURGERY    . KNEE SURGERY    . SHOULDER ARTHROSCOPY WITH ROTATOR CUFF REPAIR      reports that he quit smoking about 30 years ago. His smoking use included cigarettes. He has a 30.00 pack-year smoking history. He has never used smokeless tobacco. He reports that he does not drink alcohol or use drugs. family history includes Breast cancer in his mother; Heart failure in his father. He was adopted. No Known Allergies   Review of Systems  Constitutional: Negative for appetite change, chills, fever and unexpected weight change.  Respiratory: Negative for cough and shortness of breath.   Cardiovascular: Negative for chest pain.  Neurological: Positive for  weakness.       Objective:   Physical Exam Constitutional:      Appearance: Normal appearance.  Neck:     Musculoskeletal: Neck supple.  Cardiovascular:     Rate and Rhythm: Normal rate and regular rhythm.  Pulmonary:     Effort: Pulmonary effort is normal.     Breath sounds: Normal breath sounds.  Skin:    Comments: He has crusted somewhat ulcerative area left cheek over an area approximately 1-1/2 x 3 cm.  Neurological:     Mental Status: He is alert.        Assessment:     #1 squamous cell carcinoma left face by report from patient.  We have not seen path report yet.  He had some blistering and irritation after using prescribed imiquimod cream  #2 bilateral foot drop    Plan:     -Patient already has follow-up scheduled with dermatology and he will proceed with that -Order was given recently for bracing.  This is important to help him with ongoing ambulation and reducing risk of falls  [name] MD [facility] at [facility]

## 2019-08-26 DIAGNOSIS — L821 Other seborrheic keratosis: Secondary | ICD-10-CM | POA: Diagnosis not present

## 2019-08-26 DIAGNOSIS — L578 Other skin changes due to chronic exposure to nonionizing radiation: Secondary | ICD-10-CM | POA: Diagnosis not present

## 2019-08-26 DIAGNOSIS — D229 Melanocytic nevi, unspecified: Secondary | ICD-10-CM | POA: Diagnosis not present

## 2019-08-26 DIAGNOSIS — D692 Other nonthrombocytopenic purpura: Secondary | ICD-10-CM | POA: Diagnosis not present

## 2019-08-26 DIAGNOSIS — D1801 Hemangioma of skin and subcutaneous tissue: Secondary | ICD-10-CM | POA: Diagnosis not present

## 2019-08-27 DIAGNOSIS — G4733 Obstructive sleep apnea (adult) (pediatric): Secondary | ICD-10-CM | POA: Diagnosis not present

## 2019-09-16 ENCOUNTER — Ambulatory Visit: Payer: Medicare Other | Admitting: Neurology

## 2019-09-23 DIAGNOSIS — M21372 Foot drop, left foot: Secondary | ICD-10-CM | POA: Diagnosis not present

## 2019-09-24 ENCOUNTER — Telehealth: Payer: Self-pay | Admitting: Family Medicine

## 2019-09-24 NOTE — Telephone Encounter (Signed)
Referral Request - Has patient seen PCP for this complaint? Yes.   *If NO, is insurance requiring patient see PCP for this issue before PCP can refer them? Referral for which specialty: physical therapy Preferred provider/office: [facility] Reason for referral: knee brace issue

## 2019-09-24 NOTE — Telephone Encounter (Signed)
Please advise 

## 2019-09-26 NOTE — Telephone Encounter (Signed)
OK to refer as requested.

## 2019-09-27 ENCOUNTER — Other Ambulatory Visit: Payer: Self-pay

## 2019-09-27 DIAGNOSIS — S8991XA Unspecified injury of right lower leg, initial encounter: Secondary | ICD-10-CM

## 2019-09-27 NOTE — Telephone Encounter (Signed)
Referral has been placed. 

## 2019-09-28 NOTE — Telephone Encounter (Signed)
Pt called in stating that he was told by [facility] that they have not received referral.   Pt would like further assistance

## 2019-09-29 ENCOUNTER — Other Ambulatory Visit: Payer: Self-pay

## 2019-09-29 ENCOUNTER — Telehealth: Payer: Self-pay

## 2019-09-29 DIAGNOSIS — S8992XA Unspecified injury of left lower leg, initial encounter: Secondary | ICD-10-CM

## 2019-09-29 MED ORDER — GABAPENTIN 100 MG PO CAPS: ORAL_CAPSULE | ORAL | 1 refills | Status: DC

## 2019-09-29 NOTE — Telephone Encounter (Signed)
Copied from [place] [id number]. Topic: General - Other >> Sep 29, 2019 11:29 AM Yvette Rack wrote: Reason for CRM: Pt stated the incorrect information was sent in regards to the referral for physical therapy. Pt stated the request was suppose to be for his left leg. Pt requests that the referral request be corrected and sent for the left leg.

## 2019-09-29 NOTE — Telephone Encounter (Signed)
Sheena, are you able send this to the PT again? We discussed this morning. I will place a new referral for the left leg. Thank you!

## 2019-09-29 NOTE — Telephone Encounter (Signed)
Last referral sent for Right Leg.   New referral is for Left leg and he states they will not see him until order for Left Leg is sent over.  Thank you!!

## 2019-09-29 NOTE — Telephone Encounter (Signed)
Referral for Left leg sent to [facility].

## 2019-09-29 NOTE — Telephone Encounter (Signed)
This has already been done.

## 2019-09-29 NOTE — Telephone Encounter (Signed)
Called patient and let him know that I will have someone help with this referral. I have spoken to [facility] and she is going to send to the [facility]. Patient verbalized an understanding.

## 2019-10-07 DIAGNOSIS — G4733 Obstructive sleep apnea (adult) (pediatric): Secondary | ICD-10-CM | POA: Diagnosis not present

## 2019-10-14 ENCOUNTER — Ambulatory Visit: Payer: Medicare Other | Admitting: Podiatry

## 2019-10-14 ENCOUNTER — Other Ambulatory Visit: Payer: Self-pay

## 2019-10-14 ENCOUNTER — Encounter: Payer: Self-pay | Admitting: Podiatry

## 2019-10-14 VITALS — Temp 98.1°F

## 2019-10-14 DIAGNOSIS — M79675 Pain in left toe(s): Secondary | ICD-10-CM | POA: Diagnosis not present

## 2019-10-14 DIAGNOSIS — B351 Tinea unguium: Secondary | ICD-10-CM | POA: Diagnosis not present

## 2019-10-22 ENCOUNTER — Other Ambulatory Visit: Payer: Self-pay | Admitting: Family Medicine

## 2019-10-22 ENCOUNTER — Telehealth: Payer: Self-pay

## 2019-10-22 MED ORDER — SIMVASTATIN 40 MG PO TABS: 40.0000 mg | ORAL_TABLET | Freq: Every day | ORAL | 0 refills | Status: DC

## 2019-10-22 NOTE — Telephone Encounter (Signed)
Note not needed 

## 2019-10-22 NOTE — Telephone Encounter (Signed)
Copied from [place] [id number]. Topic: Quick Communication - Rx Refill/Question >> Oct 22, 2019  1:35 PM Izola Price, [place] A wrote: Medication: simvastatin (ZOCOR) 40 MG tablet ([Pharmacy has tried reaching out multiple times to get medication signed off on and sent back over.)  Has the patient contacted their pharmacy? Yes (Agent: If no, request that the patient contact the pharmacy for the refill.) (Agent: If yes, when and what did the pharmacy advise?)Contact PCP  Preferred Pharmacy (with phone number or street name):[facility] [place], [address] AT [address]  Phone:  [phone] Fax:  [phone]     Agent: Please be advised that RX refills may take up to 3 business days. We ask that you follow-up with your pharmacy.

## 2019-10-25 NOTE — Progress Notes (Signed)
Subjective: 78 year old male presents the office today for concerns of thick, discolored, elongated toenails that he cannot trim himself to the left hallux toenail.  He states it starts at her 42nd toe causing irritation.  Denies any redness or drainage or any swelling. Denies any systemic complaints such as fevers, chills, nausea, vomiting. No acute changes since last appointment, and no other complaints at this time.   Objective: AAO x3, NAD DP/PT pulses palpable bilaterally, CRT less than 3 seconds Left hallux toenails hypertrophic, dystrophic and discolored with yellow-brown discoloration.  There is causing irritation to the second toe.  No open sores identified. No pain with calf compression, swelling, warmth, erythema  Assessment: Left hallux onychodystrophy/symptomatic onychomycosis  Plan: -All treatment options discussed with the patient including all alternatives, risks, complications.  -Discussed treatment options.  Also pursued nail debridement.  We have the toenails and complications of bleeding x1. -Patient encouraged to call the office with any questions, concerns, change in symptoms.   Return in about 6 months (around 04/13/2020).  Trula Slade DPM

## 2019-10-27 DIAGNOSIS — D485 Neoplasm of uncertain behavior of skin: Secondary | ICD-10-CM | POA: Diagnosis not present

## 2019-11-09 ENCOUNTER — Encounter: Payer: Self-pay | Admitting: Family Medicine

## 2019-11-09 DIAGNOSIS — H25013 Cortical age-related cataract, bilateral: Secondary | ICD-10-CM | POA: Diagnosis not present

## 2019-11-09 DIAGNOSIS — H40013 Open angle with borderline findings, low risk, bilateral: Secondary | ICD-10-CM | POA: Diagnosis not present

## 2019-11-09 DIAGNOSIS — H2513 Age-related nuclear cataract, bilateral: Secondary | ICD-10-CM | POA: Diagnosis not present

## 2019-11-24 ENCOUNTER — Ambulatory Visit: Payer: Medicare Other

## 2019-12-10 DIAGNOSIS — R3915 Urgency of urination: Secondary | ICD-10-CM | POA: Diagnosis not present

## 2019-12-10 DIAGNOSIS — N5201 Erectile dysfunction due to arterial insufficiency: Secondary | ICD-10-CM | POA: Diagnosis not present

## 2019-12-29 ENCOUNTER — Other Ambulatory Visit: Payer: Self-pay

## 2019-12-29 ENCOUNTER — Ambulatory Visit (INDEPENDENT_AMBULATORY_CARE_PROVIDER_SITE_OTHER): Payer: Medicare PPO

## 2019-12-29 DIAGNOSIS — Z0000 Encounter for general adult medical examination without abnormal findings: Secondary | ICD-10-CM

## 2019-12-29 NOTE — Progress Notes (Signed)
This visit is being conducted via phone call due to the COVID-19 pandemic. This patient has given me verbal consent via phone to conduct this visit, patient states they are participating from their home address. Some vital signs may be absent or patient reported.   Patient identification: identified by name, DOB, and current address.  Location provider: [place] [facility] Persons participating in the virtual visit: Denman George LPN, patient, and Dr. Carolann Littler  Subjective:   Richard Fernandez is a 79 y.o. male who presents for Medicare Annual/Subsequent preventive examination.  Review of Systems:   Cardiac Risk Factors include: advanced age (>74men, >35 women);male gender;dyslipidemia    Objective:    Vitals: There were no vitals taken for this visit.  There is no height or weight on file to calculate BMI.  Advanced Directives 12/29/2019 10/07/2016  Does Patient Have a Medical Advance Directive? Yes No  Type of Advance Directive Living will;Healthcare Power of Attorney -  Does patient want to make changes to medical advance directive? No - Patient declined -  Copy of [place] in Chart? No - copy requested -  Would patient like information on creating a medical advance directive? - No - Patient declined    Tobacco Social History   Tobacco Use  Smoking Status Former Smoker  . Packs/day: 1.50  . Years: 20.00  . Pack years: 30.00  . Types: Cigarettes  . Quit date: 11/04/1988  . Years since quitting: 31.1  Smokeless Tobacco Never Used     Counseling given: Not Answered   Clinical Intake:  Pre-visit preparation completed: Yes  Pain : No/denies pain  Diabetes: No  How often do you need to have someone help you when you read instructions, pamphlets, or other written materials from your doctor or pharmacy?: 2 - Rarely  Interpreter Needed?: No  Information entered by :: Denman George LPN  Past Medical History:  Diagnosis Date  . CEREBROVASCULAR  ACCIDENT, HX OF 11/29/2010  . DEPRESSION 11/29/2010  . Hearing loss   . HYPERLIPIDEMIA 11/29/2010  . OBSTRUCTIVE SLEEP APNEA 11/29/2010   CPAP  . PTSD (post-traumatic stress disorder)   . [facility]roke [facility])    Past Surgical History:  Procedure Laterality Date  . Gray  . HAND SURGERY    . KNEE SURGERY    . SHOULDER ARTHROSCOPY WITH ROTATOR CUFF REPAIR     Family History  Adopted: Yes  Problem Relation Age of Onset  . Breast cancer Mother   . Heart failure Father    Social History   Socioeconomic History  . Marital status: Married    Spouse name: [name]   . Number of children: 1  . Years of education: college  . Highest education level: Not on file  Occupational History  . Occupation: part time counselor   Tobacco Use  . Smoking status: Former Smoker    Packs/day: 1.50    Years: 20.00    Pack years: 30.00    Types: Cigarettes    Quit date: 11/04/1988    Years since quitting: 31.1  . Smokeless tobacco: Never Used  Substance and Sexual Activity  . Alcohol use: No    Alcohol/week: 0.0 standard drinks  . Drug use: No  . Sexual activity: Not on file  Other Topics Concern  . Not on file  Social History Narrative   Pts is adopted. Patient lives at home with his [name].   Retired but still working some.   Education some college.  Left handed.   Caffeine four cups of coffee daily.      Social Determinants of Health   Financial Resource Strain:   . Difficulty of Paying Living Expenses: Not on file  Food Insecurity:   . Worried About Charity fundraiser in the Last Year: Not on file  . Ran Out of Food in the Last Year: Not on file  Transportation Needs:   . Lack of Transportation (Medical): Not on file  . Lack of Transportation (Non-Medical): Not on file  Physical Activity:   . Days of Exercise per Week: Not on file  . Minutes of Exercise per Session: Not on file  Stress:   . Feeling of Stress : Not on file  Social Connections:   . Frequency of  Communication with Friends and Family: Not on file  . Frequency of Social Gatherings with Friends and Family: Not on file  . Attends Religious Services: Not on file  . Active Member of Clubs or Organizations: Not on file  . Attends Archivist Meetings: Not on file  . Marital Status: Not on file    Outpatient Encounter Medications as of 12/29/2019  Medication Sig  . busPIRone (BUSPAR) 7.5 MG tablet TAKE 1 TABLET BY MOUTH TWICE DAILY  . clopidogrel (PLAVIX) 75 MG tablet Take 1 tablet (75 mg total) by mouth daily.  . fish oil-omega-3 fatty acids 1000 MG capsule Take 2 g by mouth 2 (two) times daily.   Marland Kitchen FLUoxetine (PROZAC) 40 MG capsule TAKE 1 CAPSULE BY MOUTH EVERY DAY  . gabapentin (NEURONTIN) 100 MG capsule TAKE 1 CAPSULE(100 MG) BY MOUTH TWICE DAILY  . Hypromellose (ARTIFICIAL TEARS OP) Place 1 drop into both eyes daily as needed (dry eyes).  . imiquimod (ALDARA) 5 % cream APPLY ONCE DAILY TO AREA ON CHEEK FOR 6 WEEKS  . modafinil (PROVIGIL) 100 MG tablet Take 1 tablet (100 mg total) by mouth daily as needed (each morning as needed).  . Multiple Vitamin (MULTI VITAMIN MENS PO) Take 1 tablet by mouth daily.   . mupirocin ointment (BACTROBAN) 2 % Place 1 application into the nose 2 (two) times daily.  Marland Kitchen oxybutynin (DITROPAN-XL) 10 MG 24 hr tablet TK 1 T PO QD  . simvastatin (ZOCOR) 40 MG tablet Take 1 tablet (40 mg total) by mouth daily.  . [DISCONTINUED] oxybutynin (DITROPAN) 5 MG tablet    No facility-administered encounter medications on file as of 12/29/2019.    Activities of Daily Living In your present state of health, do you have any difficulty performing the following activities: 12/29/2019  Hearing? Y  Vision? N  Difficulty concentrating or making decisions? Y  Comment some confusion at times  Walking or climbing stairs? Y  Comment bilateral foot drop requiring braces  Dressing or bathing? N  Doing errands, shopping? N  Preparing Food and eating ? N  Using the  Toilet? N  In the past six months, have you accidently leaked urine? Y  Comment taking oxybutynin and dextrol la  Do you have problems with loss of bowel control? N  Managing your Medications? N  Managing your Finances? N  Housekeeping or managing your Housekeeping? N  Some recent data might be hidden    Patient Care Team: [name], MD as PCP - General (Family Medicine) Dohmeier, Asencion Partridge, MD as Consulting Physician (Neurology) Hortencia Pilar, MD as Consulting Physician (Ophthalmology) Trula , DPM as Consulting Physician (Podiatry)   Assessment:   This is a routine wellness  examination for [facility].  Exercise Activities and Dietary recommendations Current Exercise Habits: The patient does not participate in regular exercise at present  Goals   None     Fall Risk Fall Risk  12/29/2019 08/26/2018 01/03/2016 01/03/2016 11/03/2014  Falls in the past year? 1 Yes Yes No Yes  Number falls in past yr: 1 1 1  - 1  Injury with Fall? 0 No No - Yes  Risk for fall due to : History of fall(s);Impaired mobility;Impaired balance/gait - History of fall(s);Impaired balance/gait - -  Follow up Education provided;Falls prevention discussed;Falls evaluation completed - Falls prevention discussed - -   Is the patient's home free of loose throw rugs in walkways, pet beds, electrical cords, etc?   yes      Grab bars in the bathroom? yes      Handrails on the stairs?   yes      Adequate lighting?   yes  Depression Screen PHQ 2/9 Scores 12/29/2019 01/03/2016 01/03/2016 11/03/2014  PHQ - 2 Score 0 0 6 0  PHQ- 9 Score - - 6 -    Cognitive Function     6CIT Screen 12/29/2019  What Year? 0 points  What month? 0 points  What time? 0 points  Count back from 20 0 points  Months in reverse 2 points  Repeat phrase 2 points  Total Score 4    Immunization History  Administered Date(s) Administered  . Influenza Split 07/25/2011, 08/12/2012  . Influenza Whole 09/04/2010  .  Influenza, High Dose Seasonal PF 09/03/2013, 07/29/2018, 10/14/2019  . Influenza,inj,Quad PF,6+ Mos 11/03/2014  . Influenza-Unspecified 07/19/2016, 09/05/2017  . Pneumococcal Conjugate-13 11/03/2014  . Pneumococcal Polysaccharide-23 11/04/2009  . Td 11/04/2005  . Tdap 06/28/2019  . Zoster 02/11/2009    Qualifies for Shingles Vaccine? Discussed and patient will check with pharmacy for coverage.  Patient education handout provided   Screening Tests Health Maintenance  Topic Date Due  . TETANUS/TDAP  06/27/2029  . INFLUENZA VACCINE  Completed  . PNA vac Low Risk Adult  Completed   Cancer Screenings: Lung: Low Dose CT Chest recommended if Age 70-80 years, 30 pack-year currently smoking OR have quit w/in 15years. Patient does not qualify. Colorectal: No longer indicated   Plan:  I have personally reviewed and addressed the Medicare Annual Wellness questionnaire and have noted the following in the patient's chart:  A. Medical and social history B. Use of alcohol, tobacco or illicit drugs  C. Current medications and supplements D. Functional ability and status E.  Nutritional status F.  Physical activity G. Advance directives H. List of other physicians I.  Hospitalizations, surgeries, and ER visits in previous 12 months J.  [place] such as hearing and vision if needed, cognitive and depression L. Referrals, records requested, and appointments- none   In addition, I have reviewed and discussed with patient certain preventive protocols, quality metrics, and best practice recommendations. A written personalized care plan for preventive services as well as general preventive health recommendations were provided to patient.   Signed,  Denman George, LPN  Nurse Health Advisor   Nurse Notes: Patient has had 1st Covid vaccine

## 2019-12-29 NOTE — Patient Instructions (Signed)
Richard Fernandez , Thank you for taking time to come for your Medicare Wellness Visit. I appreciate your ongoing commitment to your health goals. Please review the following plan we discussed and let me know if I can assist you in the future.   Screening recommendations/referrals: Colorectal Screening: No longer indicated   Vision and Dental Exams: Recommended annual ophthalmology exams for early detection of glaucoma and other disorders of the eye Recommended annual dental exams for proper oral hygiene  Vaccinations: Influenza vaccine: completed 10/14/19 Pneumococcal vaccine: up to date; last 11/03/14 Tdap vaccine: up to date; last 06/28/19 Shingles vaccine: Please call your insurance company to determine your out of pocket expense for the Shingrix vaccine. You may receive this vaccine at your local pharmacy. (see handout)   Advanced directives: Please bring a copy of your POA (Power of Attorney) and/or Living Will to your next appointment.  Goals: Recommend to drink at least 6-8 8oz glasses of water per day and consume a balanced diet rich in fresh fruits and vegetables.   Next appointment: Please schedule your Annual Wellness Visit with your Nurse Health Advisor in one year.  Preventive Care 73 Years and Older, Male Preventive care refers to lifestyle choices and visits with your health care provider that can promote health and wellness. What does preventive care include?  A yearly physical exam. This is also called an annual well check.  Dental exams once or twice a year.  Routine eye exams. Ask your health care provider how often you should have your eyes checked.  Personal lifestyle choices, including:  Daily care of your teeth and gums.  Regular physical activity.  Eating a healthy diet.  Avoiding tobacco and drug use.  Limiting alcohol use.  Practicing safe sex.  Taking low doses of aspirin every day if recommended by your health care provider..  Taking vitamin and  mineral supplements as recommended by your health care provider. What happens during an annual well check? The services and screenings done by your health care provider during your annual well check will depend on your age, overall health, lifestyle risk factors, and family history of disease. Counseling  Your health care provider may ask you questions about your:  Alcohol use.  Tobacco use.  Drug use.  Emotional well-being.  Home and relationship well-being.  Sexual activity.  Eating habits.  History of falls.  Memory and ability to understand (cognition).  Work and work Statistician. Screening  You may have the following tests or measurements:  Height, weight, and BMI.  Blood pressure.  Lipid and cholesterol levels. These may be checked every 5 years, or more frequently if you are over 26 years old.  Skin check.  Lung cancer screening. You may have this screening every year starting at age 26 if you have a 30-pack-year history of smoking and currently smoke or have quit within the past 15 years.  Fecal occult blood test (FOBT) of the stool. You may have this test every year starting at age 51.  Flexible sigmoidoscopy or colonoscopy. You may have a sigmoidoscopy every 5 years or a colonoscopy every 10 years starting at age 60.  Prostate cancer screening. Recommendations will vary depending on your family history and other risks.  Hepatitis C blood test.  Hepatitis B blood test.  Sexually transmitted disease (STD) testing.  Diabetes screening. This is done by checking your blood sugar (glucose) after you have not eaten for a while (fasting). You may have this done every 1-3 years.  Abdominal aortic  aneurysm (AAA) screening. You may need this if you are a current or former smoker.  Osteoporosis. You may be screened starting at age 37 if you are at high risk. Talk with your health care provider about your test results, treatment options, and if necessary, the need  for more tests. Vaccines  Your health care provider may recommend certain vaccines, such as:  Influenza vaccine. This is recommended every year.  Tetanus, diphtheria, and acellular pertussis (Tdap, Td) vaccine. You may need a Td booster every 10 years.  Zoster vaccine. You may need this after age 53.  Pneumococcal 13-valent conjugate (PCV13) vaccine. One dose is recommended after age 47.  Pneumococcal polysaccharide (PPSV23) vaccine. One dose is recommended after age 39. Talk to your health care provider about which screenings and vaccines you need and how often you need them. This information is not intended to replace advice given to you by your health care provider. Make sure you discuss any questions you have with your health care provider. Document Released: 11/17/2015 Document Revised: 07/10/2016 Document Reviewed: 08/22/2015 Elsevier Interactive Patient Education  2017 [employer] Prevention in the Home Falls can cause injuries. They can happen to people of all ages. There are many things you can do to make your home safe and to help prevent falls. What can I do on the outside of my home?  Regularly fix the edges of walkways and driveways and fix any cracks.  Remove anything that might make you trip as you walk through a door, such as a raised step or threshold.  Trim any bushes or trees on the path to your home.  Use bright outdoor lighting.  Clear any walking paths of anything that might make someone trip, such as rocks or tools.  Regularly check to see if handrails are loose or broken. Make sure that both sides of any steps have handrails.  Any raised decks and porches should have guardrails on the edges.  Have any leaves, snow, or ice cleared regularly.  Use sand or salt on walking paths during winter.  Clean up any spills in your garage right away. This includes oil or grease spills. What can I do in the bathroom?  Use night lights.  Install grab bars  by the toilet and in the tub and shower. Do not use towel bars as grab bars.  Use non-skid mats or decals in the tub or shower.  If you need to sit down in the shower, use a plastic, non-slip stool.  Keep the floor dry. Clean up any water that spills on the floor as soon as it happens.  Remove soap buildup in the tub or shower regularly.  Attach bath mats securely with double-sided non-slip rug tape.  Do not have throw rugs and other things on the floor that can make you trip. What can I do in the bedroom?  Use night lights.  Make sure that you have a light by your bed that is easy to reach.  Do not use any sheets or blankets that are too big for your bed. They should not hang down onto the floor.  Have a firm chair that has side arms. You can use this for support while you get dressed.  Do not have throw rugs and other things on the floor that can make you trip. What can I do in the kitchen?  Clean up any spills right away.  Avoid walking on wet floors.  Keep items that you use a lot  in easy-to-reach places.  If you need to reach something above you, use a strong step stool that has a grab bar.  Keep electrical cords out of the way.  Do not use floor polish or wax that makes floors slippery. If you must use wax, use non-skid floor wax.  Do not have throw rugs and other things on the floor that can make you trip. What can I do with my stairs?  Do not leave any items on the stairs.  Make sure that there are handrails on both sides of the stairs and use them. Fix handrails that are broken or loose. Make sure that handrails are as long as the stairways.  Check any carpeting to make sure that it is firmly attached to the stairs. Fix any carpet that is loose or worn.  Avoid having throw rugs at the top or bottom of the stairs. If you do have throw rugs, attach them to the floor with carpet tape.  Make sure that you have a light switch at the top of the stairs and the  bottom of the stairs. If you do not have them, ask someone to add them for you. What else can I do to help prevent falls?  Wear shoes that:  Do not have high heels.  Have rubber bottoms.  Are comfortable and fit you well.  Are closed at the toe. Do not wear sandals.  If you use a stepladder:  Make sure that it is fully opened. Do not climb a closed stepladder.  Make sure that both sides of the stepladder are locked into place.  Ask someone to hold it for you, if possible.  Clearly mark and make sure that you can see:  Any grab bars or handrails.  First and last steps.  Where the edge of each step is.  Use tools that help you move around (mobility aids) if they are needed. These include:  Canes.  Walkers.  Scooters.  Crutches.  Turn on the lights when you go into a dark area. Replace any light bulbs as soon as they burn out.  Set up your furniture so you have a clear path. Avoid moving your furniture around.  If any of your floors are uneven, fix them.  If there are any pets around you, be aware of where they are.  Review your medicines with your doctor. Some medicines can make you feel dizzy. This can increase your chance of falling. Ask your doctor what other things that you can do to help prevent falls. This information is not intended to replace advice given to you by your health care provider. Make sure you discuss any questions you have with your health care provider. Document Released: 08/17/2009 Document Revised: 03/28/2016 Document Reviewed: 11/25/2014 Elsevier Interactive Patient Education  2017 [employer]. 2

## 2020-01-10 DIAGNOSIS — M79642 Pain in left hand: Secondary | ICD-10-CM | POA: Diagnosis not present

## 2020-01-12 DIAGNOSIS — M1812 Unilateral primary osteoarthritis of first carpometacarpal joint, left hand: Secondary | ICD-10-CM | POA: Diagnosis not present

## 2020-01-18 ENCOUNTER — Other Ambulatory Visit: Payer: Self-pay

## 2020-01-18 MED ORDER — CLOPIDOGREL BISULFATE 75 MG PO TABS: 75.0000 mg | ORAL_TABLET | Freq: Every day | ORAL | 1 refills | Status: DC

## 2020-01-18 MED ORDER — BUSPIRONE HCL 7.5 MG PO TABS: 7.5000 mg | ORAL_TABLET | Freq: Two times a day (BID) | ORAL | 1 refills | Status: DC

## 2020-01-25 DIAGNOSIS — L821 Other seborrheic keratosis: Secondary | ICD-10-CM | POA: Diagnosis not present

## 2020-01-25 DIAGNOSIS — D1801 Hemangioma of skin and subcutaneous tissue: Secondary | ICD-10-CM | POA: Diagnosis not present

## 2020-01-25 DIAGNOSIS — D229 Melanocytic nevi, unspecified: Secondary | ICD-10-CM | POA: Diagnosis not present

## 2020-01-25 DIAGNOSIS — D485 Neoplasm of uncertain behavior of skin: Secondary | ICD-10-CM | POA: Diagnosis not present

## 2020-01-25 DIAGNOSIS — C44319 Basal cell carcinoma of skin of other parts of face: Secondary | ICD-10-CM | POA: Diagnosis not present

## 2020-03-02 ENCOUNTER — Telehealth (INDEPENDENT_AMBULATORY_CARE_PROVIDER_SITE_OTHER): Payer: Medicare PPO | Admitting: Internal Medicine

## 2020-03-02 ENCOUNTER — Other Ambulatory Visit: Payer: Self-pay

## 2020-03-02 ENCOUNTER — Encounter: Payer: Self-pay | Admitting: Internal Medicine

## 2020-03-02 ENCOUNTER — Telehealth: Payer: Self-pay

## 2020-03-02 VITALS — Ht 68.0 in | Wt 210.0 lb

## 2020-03-02 DIAGNOSIS — R200 Anesthesia of skin: Secondary | ICD-10-CM | POA: Diagnosis not present

## 2020-03-02 DIAGNOSIS — F439 Reaction to severe stress, unspecified: Secondary | ICD-10-CM | POA: Diagnosis not present

## 2020-03-02 DIAGNOSIS — I6930 Unspecified sequelae of cerebral infarction: Secondary | ICD-10-CM

## 2020-03-02 NOTE — Telephone Encounter (Signed)
Patient was put on our schedule for a MyChart visit this morning with Dr. Regis Bill and I called him and this is what we discussed: He has had symptoms of left arm numbness and stutter when speaking ever since his stroke and he has recently has extreme stress and does not want to wait until tomorrow and he and his wife want to know what symptoms to look for in case anything happens and discuss how to manage acute stress. He stated that he is feeling okay just numbness and stutter get worse when under severe stress. His son is very sick and he was served some papers but did not want to discuss with me about papers. He was able to answer all questions well and I did advise him to go to ED or call 911 if symptoms changed or worsened.   Patient verbalized an understanding.

## 2020-03-02 NOTE — Progress Notes (Signed)
Virtual Visit via Video Note  I connected with@ on 03/02/20 at 10:00 AM EDT by a video enabled telemedicine application and verified that I am speaking with the correct person using two identifiers. Location patient: home Location provider: home office Persons participating in the virtual visit: patient, provider and wife   [place] national recommendations  regarding COVID 19 pandemic   video visit is advised over in office visit for this patient.  Patient aware  of the limitations of evaluation and management by telemedicine and  availability of in person appointments. and agreed to proceed.   HPI: Richard Fernandez presents for video visit  SDA   PCP NA  Had some very upsetting news  And very stressed served papers yester day  Felt inc in baseline stuttering and sent to be numb left hand ( had this with his cva ) but no new weakness dysarthria  Weakness  Speech changes swallowing or vision loss .  BP has been good . In psat under sever stress gets some aggravation of previous cva sx . There is not increase of weakness with this.   No fever no panic attack s per se .  Wants some advise about when to seek medical help  Has been on prozac and  antidepressant meds for a while.  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  ROS: See pertinent positives and negatives per HPI.  Past Medical History:  Diagnosis Date  . CEREBROVASCULAR ACCIDENT, HX OF 11/29/2010  . DEPRESSION 11/29/2010  . Hearing loss   . HYPERLIPIDEMIA 11/29/2010  . OBSTRUCTIVE SLEEP APNEA 11/29/2010   CPAP  . PTSD (post-traumatic stress disorder)   . Stroke [facility])     Past Surgical History:  Procedure Laterality Date  . [place]  . HAND SURGERY    . KNEE SURGERY    . SHOULDER ARTHROSCOPY WITH ROTATOR CUFF REPAIR      Family History  Adopted: Yes  Problem Relation Age of Onset  . Breast cancer Mother   . Heart failure Father     Social History   Tobacco Use  . Smoking status: Former Smoker    Packs/day: 1.50     Years: 20.00    Pack years: 30.00    Types: Cigarettes    Quit date: 11/04/1988    Years since quitting: 31.3  . Smokeless tobacco: Never Used  Substance Use Topics  . Alcohol use: No    Alcohol/week: 0.0 standard drinks  . Drug use: No      Current Outpatient Medications:  .  busPIRone (BUSPAR) 7.5 MG tablet, Take 1 tablet (7.5 mg total) by mouth 2 (two) times daily., Disp: 180 tablet, Rfl: 1 .  clopidogrel (PLAVIX) 75 MG tablet, Take 1 tablet (75 mg total) by mouth daily., Disp: 90 tablet, Rfl: 1 .  fish oil-omega-3 fatty acids 1000 MG capsule, Take 2 g by mouth 2 (two) times daily. , Disp: , Rfl:  .  FLUoxetine (PROZAC) 40 MG capsule, TAKE 1 CAPSULE BY MOUTH EVERY DAY, Disp: 90 capsule, Rfl: 1 .  gabapentin (NEURONTIN) 100 MG capsule, TAKE 1 CAPSULE(100 MG) BY MOUTH TWICE DAILY, Disp: 180 capsule, Rfl: 1 .  Hypromellose (ARTIFICIAL TEARS OP), Place 1 drop into both eyes daily as needed (dry eyes)., Disp: , Rfl:  .  modafinil (PROVIGIL) 100 MG tablet, Take 1 tablet (100 mg total) by mouth daily as needed (each morning as needed)., Disp: 30 tablet, Rfl: 5 .  Multiple Vitamin (MULTI VITAMIN  MENS PO), Take 1 tablet by mouth daily. , Disp: , Rfl:  .  sildenafil (VIAGRA) 100 MG tablet, , Disp: , Rfl:  .  simvastatin (ZOCOR) 40 MG tablet, Take 1 tablet (40 mg total) by mouth daily., Disp: 90 tablet, Rfl: 0 .  imiquimod (ALDARA) 5 % cream, APPLY ONCE DAILY TO AREA ON CHEEK FOR 6 WEEKS, Disp: , Rfl:  .  mupirocin ointment (BACTROBAN) 2 %, Place 1 application into the nose 2 (two) times daily. (Patient not taking: Reported on 03/02/2020), Disp: 22 g, Rfl: 0 .  oxybutynin (DITROPAN-XL) 10 MG 24 hr tablet, TK 1 T PO QD, Disp: , Rfl: 11  EXAM: BP Readings from Last 3 Encounters:  08/11/19 128/60  06/28/19 128/64  09/14/18 (!) 104/58    VITALS per patient if applicable:  GENERAL: alert, oriented, appears well and in no acute distress speech seems pretty fluent  And no dysarthria or word  anaomalies   HEENT: atraumatic, conjunttiva clear, no obvious abnormalities on inspection of external nose and ears  NECK: normal movements of the head and neck  LUNGS: on inspection no signs of respiratory distress, breathing rate appears normal, no obvious gross SOB, gasping or wheezing  CV: no obvious cyanosis  MS: moves all visible extremities  Left arm hand elevation  ( has had surgery for contracture left hand )   PSYCH/NEURO: pleasant and cooperative, nl insight  speech and thought processing grossly intact.    ASSESSMENT AND PLAN:  Discussed the following assessment and plan:    ICD-10-CM   1. Stress  F43.9   2. Numbness of left hand  R20.0   3. 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 Disc if any new sx   Of numbness weaken facial speech etc   Seek ed care   With out hesitation . )  His speech today seems quite good  . Counseled.  About stress reduction ideas   Expectant management and discussion of plan and treatment with opportunity to ask questions and all were answered. The patient agreed with the plan and demonstrated an understanding of the instructions.   Advised to call back or seek an in-person evaluation if worsening  or having  further concerns . Return for  PCP .   See text    Outside external source  DATA REVIEWED:  Last year visits   Independent historian:  Wife   Total time on date  of service including record review ordering and plan of care: 73  Minutes    Shanon Ace, MD

## 2020-03-06 ENCOUNTER — Telehealth (INDEPENDENT_AMBULATORY_CARE_PROVIDER_SITE_OTHER): Payer: Medicare PPO | Admitting: Family Medicine

## 2020-03-06 DIAGNOSIS — F419 Anxiety disorder, unspecified: Secondary | ICD-10-CM | POA: Diagnosis not present

## 2020-03-06 MED ORDER — FLUOXETINE HCL 20 MG PO TABS: 20.0000 mg | ORAL_TABLET | Freq: Every day | ORAL | 0 refills | Status: DC

## 2020-03-06 NOTE — Progress Notes (Signed)
Patient ID: Richard Fernandez, male   DOB: April 18, 1941, 79 y.o.   MRN: [id number]  This visit type was conducted due to national recommendations for restrictions regarding the COVID-19 pandemic in an effort to limit this patient's exposure and mitigate transmission in our community.   Virtual Visit via Video Note  I connected with [name] on 03/06/20 at  2:45 PM EDT by a video enabled telemedicine application and verified that I am speaking with the correct person using two identifiers.  Location patient: home Location provider:work or home office Persons participating in the virtual visit: patient, provider  I discussed the limitations of evaluation and management by telemedicine and the availability of in person appointments. The patient expressed understanding and agreed to proceed.   HPI:  Sabre had called with increased stress issues.  Refer to previous virtual visit from 03/02/2020.  He had recently been served some papers for possible lawsuit.  He had increased anxiety symptoms.  He has had some stuttering in the past and seemed to be worsened.  He states he has felt more "jittery ".  Increased anxiousness.  He apparently had some left hand numbness recently but has had none since then and no weakness.  He has had very similar symptoms in the past with increased anxiety.  Is currently on Prozac and had questions about titrating this up.  Currently 40 mg daily  He lives with his wife who is very supportive  ROS: See pertinent positives and negatives per HPI.  Past Medical History:  Diagnosis Date  . CEREBROVASCULAR ACCIDENT, HX OF 11/29/2010  . DEPRESSION 11/29/2010  . Hearing loss   . HYPERLIPIDEMIA 11/29/2010  . OBSTRUCTIVE SLEEP APNEA 11/29/2010   CPAP  . PTSD (post-traumatic stress disorder)   . Stroke [facility])     Past Surgical History:  Procedure Laterality Date  . [place]  . HAND SURGERY    . KNEE SURGERY    . SHOULDER ARTHROSCOPY WITH ROTATOR CUFF REPAIR       Family History  Adopted: Yes  Problem Relation Age of Onset  . Breast cancer Mother   . Heart failure Father     SOCIAL HX: Non-smoker   Current Outpatient Medications:  .  busPIRone (BUSPAR) 7.5 MG tablet, Take 1 tablet (7.5 mg total) by mouth 2 (two) times daily., Disp: 180 tablet, Rfl: 1 .  clopidogrel (PLAVIX) 75 MG tablet, Take 1 tablet (75 mg total) by mouth daily., Disp: 90 tablet, Rfl: 1 .  fish oil-omega-3 fatty acids 1000 MG capsule, Take 2 g by mouth 2 (two) times daily. , Disp: , Rfl:  .  FLUoxetine (PROZAC) 20 MG tablet, Take 1 tablet (20 mg total) by mouth daily., Disp: 30 tablet, Rfl: 0 .  FLUoxetine (PROZAC) 40 MG capsule, TAKE 1 CAPSULE BY MOUTH EVERY DAY, Disp: 90 capsule, Rfl: 1 .  gabapentin (NEURONTIN) 100 MG capsule, TAKE 1 CAPSULE(100 MG) BY MOUTH TWICE DAILY, Disp: 180 capsule, Rfl: 1 .  Hypromellose (ARTIFICIAL TEARS OP), Place 1 drop into both eyes daily as needed (dry eyes)., Disp: , Rfl:  .  imiquimod (ALDARA) 5 % cream, APPLY ONCE DAILY TO AREA ON CHEEK FOR 6 WEEKS, Disp: , Rfl:  .  modafinil (PROVIGIL) 100 MG tablet, Take 1 tablet (100 mg total) by mouth daily as needed (each morning as needed)., Disp: 30 tablet, Rfl: 5 .  Multiple Vitamin (MULTI VITAMIN MENS PO), Take 1 tablet by mouth daily. , Disp: , Rfl:  .  mupirocin  ointment (BACTROBAN) 2 %, Place 1 application into the nose 2 (two) times daily. (Patient not taking: Reported on 03/02/2020), Disp: 22 g, Rfl: 0 .  oxybutynin (DITROPAN-XL) 10 MG 24 hr tablet, TK 1 T PO QD, Disp: , Rfl: 11 .  sildenafil (VIAGRA) 100 MG tablet, , Disp: , Rfl:  .  simvastatin (ZOCOR) 40 MG tablet, Take 1 tablet (40 mg total) by mouth daily., Disp: 90 tablet, Rfl: 0  EXAM:  VITALS per patient if applicable:  GENERAL: alert, oriented, appears well and in no acute distress  HEENT: atraumatic, conjunttiva clear, no obvious abnormalities on inspection of external nose and ears  NECK: normal movements of the head and  neck  LUNGS: on inspection no signs of respiratory distress, breathing rate appears normal, no obvious gross SOB, gasping or wheezing  CV: no obvious cyanosis  MS: moves all visible extremities without noticeable abnormality  PSYCH/NEURO: pleasant and cooperative, no obvious depression or anxiety, speech and thought processing grossly intact  ASSESSMENT AND PLAN:  Discussed the following assessment and plan:  Anxiety symptoms.  He has history of posttraumatic stress disorder with recent new situational stressor as above  -Consider counseling -We agreed to short-term trial of Prozac at 60 mg daily but cautioned him about potential for adverse side effects with increased dosage.  If he is not seeing improvement in 2 weeks drop back to 40 mg and be in touch.     I discussed the assessment and treatment plan with the patient. The patient was provided an opportunity to ask questions and all were answered. The patient agreed with the plan and demonstrated an understanding of the instructions.   The patient was advised to call back or seek an in-person evaluation if the symptoms worsen or if the condition fails to improve as anticipated.     Carolann Littler, MD

## 2020-03-07 ENCOUNTER — Telehealth: Payer: Self-pay | Admitting: Family Medicine

## 2020-03-07 ENCOUNTER — Other Ambulatory Visit: Payer: Self-pay

## 2020-03-07 MED ORDER — SIMVASTATIN 40 MG PO TABS: 40.0000 mg | ORAL_TABLET | Freq: Every day | ORAL | 0 refills | Status: DC

## 2020-03-07 NOTE — Telephone Encounter (Signed)
Rx refilled.

## 2020-03-07 NOTE — Telephone Encounter (Signed)
Pt would like a refill on: Simvastatin 40 mg  send to: [facility] [address] [id number]

## 2020-03-11 ENCOUNTER — Emergency Department (HOSPITAL_COMMUNITY)
Admission: EM | Admit: 2020-03-11 | Discharge: 2020-03-11 | Disposition: A | Discharge status: Home / Self Care | Payer: Medicare PPO | Attending: Emergency Medicine | Admitting: Emergency Medicine

## 2020-03-11 ENCOUNTER — Other Ambulatory Visit: Payer: Self-pay

## 2020-03-11 ENCOUNTER — Emergency Department (HOSPITAL_COMMUNITY): Payer: Medicare PPO

## 2020-03-11 ENCOUNTER — Encounter (HOSPITAL_COMMUNITY): Payer: Self-pay | Admitting: Emergency Medicine

## 2020-03-11 DIAGNOSIS — R791 Abnormal coagulation profile: Secondary | ICD-10-CM | POA: Insufficient documentation

## 2020-03-11 DIAGNOSIS — R531 Weakness: Secondary | ICD-10-CM

## 2020-03-11 DIAGNOSIS — Z87891 Personal history of nicotine dependence: Secondary | ICD-10-CM | POA: Insufficient documentation

## 2020-03-11 DIAGNOSIS — Z79899 Other long term (current) drug therapy: Secondary | ICD-10-CM | POA: Insufficient documentation

## 2020-03-11 DIAGNOSIS — R270 Ataxia, unspecified: Secondary | ICD-10-CM | POA: Diagnosis not present

## 2020-03-11 DIAGNOSIS — R479 Unspecified speech disturbances: Secondary | ICD-10-CM | POA: Insufficient documentation

## 2020-03-11 LAB — URINALYSIS, ROUTINE W REFLEX MICROSCOPIC
Bilirubin Urine: NEGATIVE
Glucose, UA: NEGATIVE mg/dL
Hgb urine dipstick: NEGATIVE
Ketones, ur: NEGATIVE mg/dL
Leukocytes,Ua: NEGATIVE
Nitrite: NEGATIVE
Protein, ur: NEGATIVE mg/dL
Specific Gravity, Urine: 1.006 (ref 1.005–1.030)
pH: 8 (ref 5.0–8.0)

## 2020-03-11 LAB — CBC
HCT: 39.6 % (ref 39.0–52.0)
Hemoglobin: 13.8 g/dL (ref 13.0–17.0)
MCH: 35.4 pg — ABNORMAL HIGH (ref 26.0–34.0)
MCHC: 34.8 g/dL (ref 30.0–36.0)
MCV: 101.5 fL — ABNORMAL HIGH (ref 80.0–100.0)
Platelets: 209 10*3/uL (ref 150–400)
RBC: 3.9 MIL/uL — ABNORMAL LOW (ref 4.22–5.81)
RDW: 14.1 % (ref 11.5–15.5)
WBC: 3.6 10*3/uL — ABNORMAL LOW (ref 4.0–10.5)
nRBC: 0 % (ref 0.0–0.2)

## 2020-03-11 LAB — COMPREHENSIVE METABOLIC PANEL
ALT: 14 U/L (ref 0–44)
AST: 15 U/L (ref 15–41)
Albumin: 3.7 g/dL (ref 3.5–5.0)
Alkaline Phosphatase: 47 U/L (ref 38–126)
Anion gap: 10 (ref 5–15)
BUN: 11 mg/dL (ref 8–23)
CO2: 26 mmol/L (ref 22–32)
Calcium: 8.5 mg/dL — ABNORMAL LOW (ref 8.9–10.3)
Chloride: 101 mmol/L (ref 98–111)
Creatinine, Ser: 0.52 mg/dL — ABNORMAL LOW (ref 0.61–1.24)
GFR calc Af Amer: >60 mL/min (ref >60)
GFR calc non Af Amer: >60 mL/min (ref >60)
Glucose, Bld: 112 mg/dL — ABNORMAL HIGH (ref 70–99)
Potassium: 4 mmol/L (ref 3.5–5.1)
Sodium: 137 mmol/L (ref 135–145)
Total Bilirubin: 0.6 mg/dL (ref 0.3–1.2)
Total Protein: 6.8 g/dL (ref 6.5–8.1)

## 2020-03-11 LAB — DIFFERENTIAL
Abs Immature Granulocytes: 0.01 10*3/uL (ref 0.00–0.07)
Basophils Absolute: 0 10*3/uL (ref 0.0–0.1)
Basophils Relative: 1 %
Eosinophils Absolute: 0 10*3/uL (ref 0.0–0.5)
Eosinophils Relative: 1 %
Immature Granulocytes: 0 %
Lymphocytes Relative: 20 %
Lymphs Abs: 0.7 10*3/uL (ref 0.7–4.0)
Monocytes Absolute: 0.4 10*3/uL (ref 0.1–1.0)
Monocytes Relative: 11 %
Neutro Abs: 2.4 10*3/uL (ref 1.7–7.7)
Neutrophils Relative %: 67 %

## 2020-03-11 LAB — I-STAT CHEM 8, ED
BUN: 9 mg/dL (ref 8–23)
Calcium, Ion: 1.12 mmol/L — ABNORMAL LOW (ref 1.15–1.40)
Chloride: 98 mmol/L (ref 98–111)
Creatinine, Ser: 0.6 mg/dL — ABNORMAL LOW (ref 0.61–1.24)
Glucose, Bld: 110 mg/dL — ABNORMAL HIGH (ref 70–99)
HCT: 42 % (ref 39.0–52.0)
Hemoglobin: 14.3 g/dL (ref 13.0–17.0)
Potassium: 4.1 mmol/L (ref 3.5–5.1)
Sodium: 136 mmol/L (ref 135–145)
TCO2: 28 mmol/L (ref 22–32)

## 2020-03-11 LAB — RAPID URINE DRUG SCREEN, HOSP PERFORMED
Amphetamines: NOT DETECTED
Barbiturates: NOT DETECTED
Benzodiazepines: NOT DETECTED
Cocaine: NOT DETECTED
Opiates: NOT DETECTED
Tetrahydrocannabinol: NOT DETECTED

## 2020-03-11 LAB — PROTIME-INR
INR: 1 (ref 0.8–1.2)
Prothrombin Time: 12.7 seconds (ref 11.4–15.2)

## 2020-03-11 LAB — APTT: aPTT: 25 seconds (ref 24–36)

## 2020-03-11 MED ORDER — SODIUM CHLORIDE 0.9 % IV SOLN
100.0000 mL/h | INTRAVENOUS | Status: DC
  Administered 2020-03-11: 14:00:00 100 mL/h via INTRAVENOUS

## 2020-03-11 MED ORDER — SODIUM CHLORIDE 0.9 % IV BOLUS
500.0000 mL | Freq: Once | INTRAVENOUS | Status: AC
  Administered 2020-03-11: 500 mL via INTRAVENOUS

## 2020-03-11 NOTE — ED Provider Notes (Signed)
[facility] DEPT Provider Note   CSN: [id number] Arrival date & time: 03/11/20  1206     History Chief Complaint  Patient presents with  . left sided weakness    Richard Fernandez is a 79 y.o. male.  HPI    Patient presents with his wife who provides some of the HPI. Patient has a history of multiple medical issues including prior stroke, stuttering, depression. Patient notes that he was in his usual state of health with baseline decreased functionality, but no new deficits until awakening today, possibly 8 hours ago.  Prior to that, 12 hours ago before going to bed he was in his usual state of health.  Upon awakening he noticed worsening stuttering, halting speech, as well as worsening left arm weakness and numbness.  He states that both arms are newly numb, but the left arm is more affected. After my initial quick evaluation, I discussed this with the patient's wife.  She notes that he did have some left arm numbness, over the preceding days, but the speech changes are seemingly new entirely today. No recent medication change, diet change, activity change. No pain, no confusion, though again, the patient states that he is having difficulty with word finding.  Past Medical History:  Diagnosis Date  . CEREBROVASCULAR ACCIDENT, HX OF 11/29/2010  . DEPRESSION 11/29/2010  . Hearing loss   . HYPERLIPIDEMIA 11/29/2010  . OBSTRUCTIVE SLEEP APNEA 11/29/2010   CPAP  . PTSD (post-traumatic stress disorder)   . Stroke [facility])     Patient Active Problem List   Diagnosis Date Noted  . SCCA (squamous cell carcinoma) of skin 08/11/2019  . Bilateral foot-drop 02/24/2018  . Left-sided weakness 10/07/2016  . Weakness of left arm 10/07/2016  . PTSD (post-traumatic stress disorder)   . Bipolar I disorder ([place])   . History of stroke   . Morbid obesity due to excess calories ([place]) 12/27/2015  . OSA on CPAP 12/27/2015  . Memory difficulty 12/27/2015  . Obesity (BMI  30-39.9) 10/18/2013  . Hyperlipidemia 11/29/2010  . Depression 11/29/2010  . Obstructive sleep apnea 11/29/2010  . History of cardiovascular disorder 11/29/2010    Past Surgical History:  Procedure Laterality Date  . [place]  . HAND SURGERY    . KNEE SURGERY    . SHOULDER ARTHROSCOPY WITH ROTATOR CUFF REPAIR         Family History  Adopted: Yes  Problem Relation Age of Onset  . Breast cancer Mother   . Heart failure Father     Social History   Tobacco Use  . Smoking status: Former Smoker    Packs/day: 1.50    Years: 20.00    Pack years: 30.00    Types: Cigarettes    Quit date: 11/04/1988    Years since quitting: 31.3  . Smokeless tobacco: Never Used  Substance Use Topics  . Alcohol use: No    Alcohol/week: 0.0 standard drinks  . Drug use: No    Home Medications Prior to Admission medications   Medication Sig Start Date End Date Taking? Authorizing Provider  busPIRone (BUSPAR) 7.5 MG tablet Take 1 tablet (7.5 mg total) by mouth 2 (two) times daily. 01/18/20   Burchette, Alinda Sierras, MD  clopidogrel (PLAVIX) 75 MG tablet Take 1 tablet (75 mg total) by mouth daily. 01/18/20   Burchette, Alinda Sierras, MD  fish oil-omega-3 fatty acids 1000 MG capsule Take 2 g by mouth 2 (two) times daily.     [provider]  FLUoxetine (PROZAC) 20 MG tablet Take 1 tablet (20 mg total) by mouth daily. 03/06/20   Burchette, Alinda Sierras, MD  FLUoxetine (PROZAC) 40 MG capsule TAKE 1 CAPSULE BY MOUTH EVERY DAY 02/15/19   Burchette, Alinda Sierras, MD  gabapentin (NEURONTIN) 100 MG capsule TAKE 1 CAPSULE(100 MG) BY MOUTH TWICE DAILY 09/29/19   Burchette, Alinda Sierras, MD  Hypromellose (ARTIFICIAL TEARS OP) Place 1 drop into both eyes daily as needed (dry eyes).    [provider]  imiquimod (ALDARA) 5 % cream APPLY ONCE DAILY TO AREA ON CHEEK FOR 6 WEEKS 07/29/19   [provider]  modafinil (PROVIGIL) 100 MG tablet Take 1 tablet (100 mg total) by mouth daily as needed (each morning as  needed). 08/04/19   Dohmeier, Asencion Partridge, MD  Multiple Vitamin (MULTI VITAMIN MENS PO) Take 1 tablet by mouth daily.     [provider]  mupirocin ointment (BACTROBAN) 2 % Place 1 application into the nose 2 (two) times daily. Patient not taking: Reported on 03/02/2020 06/28/19   [name], MD  oxybutynin (DITROPAN-XL) 10 MG 24 hr tablet TK 1 T PO QD 09/02/17   [provider]  sildenafil (VIAGRA) 100 MG tablet  02/25/20   [provider]  simvastatin (ZOCOR) 40 MG tablet Take 1 tablet (40 mg total) by mouth daily. 03/07/20   Burchette, Alinda Sierras, MD    Allergies    Patient has no known allergies.  Review of Systems   Review of Systems  Constitutional:       Per HPI, otherwise negative  HENT:       Per HPI, otherwise negative  Respiratory:       Per HPI, otherwise negative  Cardiovascular:       Per HPI, otherwise negative  Gastrointestinal: Negative for vomiting.  Endocrine:       Negative aside from HPI  Genitourinary:       Neg aside from HPI   Musculoskeletal:       Per HPI, otherwise negative  Skin: Negative.   Neurological: Positive for tremors, speech difficulty, weakness and numbness. Negative for syncope.    Physical Exam Updated Vital Signs BP (!) 146/53 (BP Location: Left Arm)   Pulse 66   Temp 97.7 F (36.5 C) (Oral)   Resp 18   SpO2 94%   Physical Exam Vitals and nursing note reviewed.  Constitutional:      Appearance: He is well-developed. He is obese. He is ill-appearing.  HENT:     Head: Normocephalic and atraumatic.  Eyes:     Conjunctiva/sclera: Conjunctivae normal.  Cardiovascular:     Rate and Rhythm: Normal rate and regular rhythm.  Pulmonary:     Effort: Pulmonary effort is normal. No respiratory distress.     Breath sounds: No stridor.  Abdominal:     General: There is no distension.  Musculoskeletal:     Right lower leg: Edema present.     Left lower leg: Edema present.  Skin:    General: Skin is warm and  dry.  Neurological:     Mental Status: He is alert and oriented to person, place, and time.     Comments: Speech is inconsistent, with periods of stuttering, then clear, but deliberate specific phonation.  No gross word finding difficulty.  Face is symmetric.  Patient has 4/5 strength right upper extremity.  Left upper extremity exam is inconsistent, patient moves it freely, minimally, and when asked to hold against gravity  he briefly does so, but then the arm drops. Lower extremity strength 3/5 bilateral, symmetric     ED Results / Procedures / Treatments   Labs (all labs ordered are listed, but only abnormal results are displayed) Labs Reviewed  CBC - Abnormal; Notable for the following components:      Result Value   WBC 3.6 (*)    RBC 3.90 (*)    MCV 101.5 (*)    MCH 35.4 (*)    All other components within normal limits  COMPREHENSIVE METABOLIC PANEL - Abnormal; Notable for the following components:   Glucose, Bld 112 (*)    Creatinine, Ser 0.52 (*)    Calcium 8.5 (*)    All other components within normal limits  URINALYSIS, ROUTINE W REFLEX MICROSCOPIC - Abnormal; Notable for the following components:   Color, Urine STRAW (*)    All other components within normal limits  I-STAT CHEM 8, ED - Abnormal; Notable for the following components:   Creatinine, Ser 0.60 (*)    Glucose, Bld 110 (*)    Calcium, Ion 1.12 (*)    All other components within normal limits  PROTIME-INR  APTT  DIFFERENTIAL  RAPID URINE DRUG SCREEN, HOSP PERFORMED    EKG EKG Interpretation  Date/Time:  Saturday Mar 11 2020 12:20:38 EDT Ventricular Rate:  60 PR Interval:    QRS Duration: 103 QT Interval:  441 QTC Calculation: 441 R Axis:   73 Text Interpretation: Sinus rhythm Anteroseptal infarct, age indeterminate No STEMI Confirmed by Nanda Quinton [phone]) on 03/11/2020 12:22:51 PM   Radiology CT Head Wo Contrast  Result Date: 03/11/2020 CLINICAL DATA:  Ataxia and left-sided weakness EXAM: CT  HEAD WITHOUT CONTRAST TECHNIQUE: Contiguous axial images were obtained from the base of the skull through the vertex without intravenous contrast. COMPARISON:  October 07, 2016 FINDINGS: Brain: There is stable age related volume loss. There is no intracranial mass, hemorrhage, extra-axial fluid collection, or midline shift. There is mild small vessel disease in the centra semiovale bilaterally. No acute appearing infarct is evident on this study. Vascular: There is no hyperdense vessel. No appreciable vascular calcification evident. Skull: The bony calvarium appears intact. Sinuses/Orbits: There is mucosal thickening in several ethmoid air cells. Other visualized paranasal sinuses are clear. Visualized orbits appear symmetric bilaterally. Other: Mastoid air cells are clear. IMPRESSION: Age related volume loss with mild periventricular small vessel disease. No acute infarct. No mass or hemorrhage. There is mucosal thickening in several ethmoid air cells. Electronically Signed   By: Lowella Grip III M.D.   On: 03/11/2020 14:25    Procedures Procedures (including critical care time)  Medications Ordered in ED Medications  sodium chloride 0.9 % bolus 500 mL (has no administration in time range)    Followed by  0.9 %  sodium chloride infusion (has no administration in time range)    ED Course  I have reviewed the triage vital signs and the nursing notes.  Pertinent labs & imaging results that were available during my care of the patient were reviewed by me and considered in my medical decision making (see chart for details).   With consideration of stroke versus recrudescence of prior deficits versus other phenomenon including infection, electrolyte abnormalities patient had CT scan, labs ordered, was placed on continuous monitoring.  Given the duration of symptoms I discussed this case with our neurologist.  With report of weakness that has possibly present for more than 1 day he is out of the  window for  intravascular intervention should he have a stroke.  Update:, Patient cannot have MRI due to shrapnel from [place].  Update:, Repeat exam reviewed labs, CT imaging, after have discussed them with our neurologist again. CT reassuring, labs reassuring, and on exam the patient is moving his left arm spontaneously, adjusting his hearing aid.  This is reassuring for low suspicion of stroke given the patient's use of Plavix, reassuring head CT.  However, given the patient's inability to tolerate MRI, stroke cannot be completely excluded, but, patient is medically maximally treated with Plavix, and after discussion with our neurology colleague, the patient is appropriate for follow-up in this regard with his neurologist. Patient's labs vital signs, physical exam otherwise reassuring, no evidence for concurrent infection, substantial electrolyte abnormalities or other acute new phenomena.  MDM Number of Diagnoses or Management Options Difficulty with speech: new, needed workup Weakness: new, needed workup   Amount and/or Complexity of Data Reviewed Clinical lab tests: reviewed Tests in the radiology section of CPT: reviewed Tests in the medicine section of CPT: reviewed Decide to obtain previous medical records or to obtain history from someone other than the patient: yes Obtain history from someone other than the patient: yes Review and summarize past medical records: yes Discuss the patient with other providers: yes Independent visualization of images, tracings, or specimens: yes  Risk of Complications, Morbidity, and/or Mortality Presenting problems: high Diagnostic procedures: high Management options: high   Final Clinical Impression(s) / ED Diagnoses Final diagnoses:  Difficulty with speech  Weakness     Carmin Muskrat, MD 03/11/20 1712

## 2020-03-11 NOTE — ED Notes (Signed)
Urine culture sent down to lab  

## 2020-03-11 NOTE — ED Notes (Signed)
Patient aware that urine sample is needed. Urinal at bedside.

## 2020-03-11 NOTE — Discharge Instructions (Addendum)
As discussed, your evaluation today has been largely reassuring.  But, it is important that you monitor your condition carefully, and do not hesitate to return to the ED if you develop new, or concerning changes in your condition. ? ?Otherwise, please follow-up with your physician for appropriate ongoing care. ? ?

## 2020-03-11 NOTE — ED Triage Notes (Signed)
Per pt, states when he went to bed at 12 last night he was "normal"-states when he woke up this am around 0730 he had left sided weakness, a closed hand which he could not open and a stutter-history of CVA's

## 2020-03-11 NOTE — ED Notes (Signed)
Family member at bedside.

## 2020-03-20 ENCOUNTER — Ambulatory Visit: Payer: Medicare PPO | Admitting: Family Medicine

## 2020-03-20 ENCOUNTER — Encounter: Payer: Self-pay | Admitting: Family Medicine

## 2020-03-20 VITALS — BP 126/62 | HR 71 | Temp 97.6°F | Wt 231.9 lb

## 2020-03-20 DIAGNOSIS — R4789 Other speech disturbances: Secondary | ICD-10-CM

## 2020-03-20 DIAGNOSIS — F419 Anxiety disorder, unspecified: Secondary | ICD-10-CM | POA: Diagnosis not present

## 2020-03-20 NOTE — Progress Notes (Signed)
Subjective:     Patient ID: Richard Fernandez, male   DOB: Mar 19, 1941, 79 y.o.   MRN: [id number]  HPI Richard Fernandez is here to discuss medications.  He had significant stress and anxiety issues recently and had some increased "stuttering ".  He actually went to the ER on 8 May and had CT head which showed no evidence for stroke.  He has had some chronic left-sided weakness from prior stroke.  He denies any true slurred speech.  He states that his stuttering symptoms have improved as his anxiety symptoms have settled down a bit.  There were no reports of any confusion.  The ER report states that he was having difficulty with word finding but he denies.  He has some shrapnel in his back from [place] wound and apparently cannot have MRI of the brain.  He had multiple labs and these were reviewed.  Urine drug screen was unremarkable.  Urinalysis normal.  CBC revealed white count of 3.6 thousand with normal hemoglobin.  Comprehensive chemistry is normal with exception of mildly elevated glucose of 112.  Electrolytes were stable  Patient had reported feeling "jittery", and stuttering in the past with severe anxiety.  He has been for several years on Prozac 40 mg daily.  We discussed possibly bumping up to 60 mg short-term but at this point he would like to stay on the 40 mg.  He is resting fairly well at night and feels his anxiety symptoms have improved.  Past Medical History:  Diagnosis Date  . CEREBROVASCULAR ACCIDENT, HX OF 11/29/2010  . DEPRESSION 11/29/2010  . Hearing loss   . HYPERLIPIDEMIA 11/29/2010  . OBSTRUCTIVE SLEEP APNEA 11/29/2010   CPAP  . PTSD (post-traumatic stress disorder)   . Stroke [facility])    Past Surgical History:  Procedure Laterality Date  . [place]  . HAND SURGERY    . KNEE SURGERY    . SHOULDER ARTHROSCOPY WITH ROTATOR CUFF REPAIR      reports that he quit smoking about 31 years ago. His smoking use included cigarettes. He has a 30.00 pack-year smoking history. He has  never used smokeless tobacco. He reports that he does not drink alcohol or use drugs. family history includes Breast cancer in his mother; Heart failure in his father. He was adopted. No Known Allergies   Review of Systems  Constitutional: Negative for fatigue.  Eyes: Negative for visual disturbance.  Respiratory: Negative for cough, chest tightness and shortness of breath.   Cardiovascular: Negative for chest pain, palpitations and leg swelling.  Gastrointestinal: Negative for abdominal pain.  Genitourinary: Negative for dysuria.  Neurological: Negative for dizziness, seizures, syncope, light-headedness and headaches.  Psychiatric/Behavioral: Negative for confusion.       Objective:   Physical Exam Vitals reviewed.  Constitutional:      Appearance: Normal appearance.  Cardiovascular:     Rate and Rhythm: Normal rate and regular rhythm.  Pulmonary:     Effort: Pulmonary effort is normal.     Breath sounds: Normal breath sounds.  Neurological:     Mental Status: He is alert.     Cranial Nerves: No cranial nerve deficit.     Comments: Speech is normal.  He ambulates with a cane.  He has some weakness left upper extremity which is chronic.  Good grip strength bilaterally.        Assessment:     #1 recent anxiety symptoms currently stable.  He has significant situational anxiety.  Past history of posttraumatic  stress disorder.  #2 symptoms of recent increased "stuttering".  Not clear this represented any kind of slurred speech.  He has chronic left upper extremity weakness which she states is unchanged.  Recent CT scan as above unremarkable for acute findings    Plan:     -Continue Prozac 40 mg daily -He has scheduled follow-up with neurology for early June -Follow-up immediately for any slurred speech, focal weakness, confusion, or other concerns -We reviewed recent CT head findings and labs with patient  [name] MD [facility]

## 2020-04-04 ENCOUNTER — Telehealth: Payer: Self-pay

## 2020-04-04 ENCOUNTER — Encounter: Payer: Self-pay | Admitting: Neurology

## 2020-04-04 ENCOUNTER — Ambulatory Visit: Payer: Self-pay | Admitting: Neurology

## 2020-04-04 NOTE — Telephone Encounter (Signed)
Patient no show for follow up appointment.

## 2020-04-10 ENCOUNTER — Other Ambulatory Visit: Payer: Self-pay

## 2020-04-10 ENCOUNTER — Encounter: Payer: Self-pay | Admitting: Neurology

## 2020-04-10 ENCOUNTER — Ambulatory Visit: Payer: Medicare PPO | Admitting: Neurology

## 2020-04-10 VITALS — BP 119/66 | HR 61 | Ht 68.0 in | Wt 222.3 lb

## 2020-04-10 DIAGNOSIS — Z8673 Personal history of transient ischemic attack (TIA), and cerebral infarction without residual deficits: Secondary | ICD-10-CM

## 2020-04-10 DIAGNOSIS — E669 Obesity, unspecified: Secondary | ICD-10-CM | POA: Diagnosis not present

## 2020-04-10 DIAGNOSIS — Z9989 Dependence on other enabling machines and devices: Secondary | ICD-10-CM | POA: Diagnosis not present

## 2020-04-10 DIAGNOSIS — M21371 Foot drop, right foot: Secondary | ICD-10-CM | POA: Diagnosis not present

## 2020-04-10 DIAGNOSIS — G4733 Obstructive sleep apnea (adult) (pediatric): Secondary | ICD-10-CM

## 2020-04-10 NOTE — Patient Instructions (Signed)
Somatic Symptom Disorder Somatic symptom disorder is a mental health condition. People with this disorder have physical (somatic) symptoms that cause distress or affect daily living. However, no other medical condition can be found to explain these symptoms. Somatic symptom disorder may interfere with relationships, work, school, or other daily activities. It may lead to frequent medical visits and many medical tests to try to find the cause of symptoms. It may also lead to surgical procedures that do not help and can cause serious problems. People with somatic symptom disorder are also at risk for alcohol or drug addiction, suicide attempts, and divorce. Somatic symptom disorder may start in childhood but is most common in young adults. The disorder may be triggered by stressful life events. It may last for several years or may come and go throughout life. What are the causes? The exact cause of this condition is often unknown. What increases the risk? The following factors may make you more likely to develop this condition:  Being male. The disorder is more common in females than males.  Having a history of childhood abuse.  Having a history of alcohol or substance abuse.  Having family members with the disorder.  Having other mental health conditions, including personality disorders. What are the signs or symptoms? You may have somatic symptoms that cannot be explained by a medical condition. Examples of somatic symptoms may include:  Pain. This may be the only physical symptom. Pain may involve any part of the body.  Stomach or intestinal symptoms. These may include nausea, indigestion, diarrhea, or abdominal pain.  Other non-specific symptoms such as fatigue, trouble sleeping, headaches, or dizziness. How is this diagnosed? You may be diagnosed with this condition if:  You have one or more somatic symptoms that cause you distress or affect your daily living.  You react to the  somatic symptoms in a way that is out of proportion to the symptoms. The reaction may include: ? Thinking all the time about the severity of the symptoms. ? Feeling very anxious all the time about the symptoms or your general health. ? Spending a lot of time and energy dealing with the symptoms or health concerns.  You have somatic symptoms either continuously or on and off for at least 6 months. Exams and tests will be done to rule out serious physical health problems. After that, your health care provider may refer you to a mental health specialist for psychological evaluation. Somatic symptoms can be related to a number of mental health conditions. How is this treated? This condition is treated with one or more of the following:  Regular follow-up visits with your health care provider for evaluation and reassurance.  Counseling or talk therapy. This involves working with a mental health specialist to: ? Help you understand what triggers your symptoms. ? Help you learn some coping skills to deal with your symptoms.  Medicine. Certain medicines can help with severe anxiety or depression caused by this disorder.  Healthy lifestyle. A balanced diet and regular exercise can reduce stress and somatic symptoms. Follow these instructions at home: Medicines  Take over-the-counter and prescription medicines only as told by your health care provider. Eating and drinking  Eat a healthy diet that includes plenty of vegetables, fruits, whole grains, low-fat dairy products, and lean protein. Do not eat a lot of foods that are high in solid fats, added sugars, or salt. General instructions  Do not use any products that contain nicotine or tobacco, such as cigarettes and e-cigarettes.  If you need help quitting, ask your health care provider.  Do not use illegal drugs. Do not misuse prescription medicines.  Do not drink alcohol if your health care provider tells you not to.  Get regular exercise.  Most adults should exercise for at least 150 minutes each week. Check with your health care provider before starting an exercise program.  Get the right amount and quality of sleep. Most adults need 7-9 hours of sleep each night.  Keep all follow-up visits as told by your health care provider. This is important. Contact a health care provider if:  Your pain or symptoms do not go away or they become severe.  You develop new symptoms. Get help right away if:  You have serious thoughts about hurting yourself or someone else. If you ever feel like you may hurt yourself or others, or have thoughts about taking your own life, get help right away. You can go to your nearest emergency department or call:  Your local emergency services (911 in the [place]  A suicide crisis helpline, such as the [place] at 867-848-6107. This is open 24 hours a day. Summary  People with somatic symptom disorder have physical symptoms that cause distress or affect daily living. However, no other medical condition can be found to explain these symptoms.  Somatic symptom disorder may interfere with relationships, work, school, or other daily activities. It may lead to frequent medical visits and many medical tests to try to find the cause of symptoms.  Exams and tests will be done to rule out serious physical health problems. After that, your health care provider may refer you to a mental health specialist for psychological evaluation. This information is not intended to replace advice given to you by your health care provider. Make sure you discuss any questions you have with your health care provider. Document Revised: 12/02/2017 Document Reviewed: 12/02/2017 Elsevier Patient Education  2020 [employer].
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
## 2020-04-11 ENCOUNTER — Telehealth: Payer: Self-pay | Admitting: Neurology

## 2020-04-11 NOTE — Telephone Encounter (Signed)
Mcarthur Rossetti Josem Kaufmann: [phone] (exp. 04/11/20 to 05/11/20). Patient is scheduled at GI for 04/25/20.

## 2020-04-13 ENCOUNTER — Ambulatory Visit: Payer: Medicare Other | Admitting: Podiatry

## 2020-04-24 ENCOUNTER — Other Ambulatory Visit: Payer: Self-pay | Admitting: Family Medicine

## 2020-04-25 ENCOUNTER — Other Ambulatory Visit: Payer: Medicare PPO

## 2020-05-01 ENCOUNTER — Other Ambulatory Visit: Payer: Self-pay

## 2020-05-01 ENCOUNTER — Ambulatory Visit (INDEPENDENT_AMBULATORY_CARE_PROVIDER_SITE_OTHER): Payer: Medicare PPO | Admitting: Family Medicine

## 2020-05-01 ENCOUNTER — Encounter: Payer: Self-pay | Admitting: Family Medicine

## 2020-05-01 VITALS — BP 116/62 | HR 74 | Temp 98.1°F | Wt 227.7 lb

## 2020-05-01 DIAGNOSIS — F4310 Post-traumatic stress disorder, unspecified: Secondary | ICD-10-CM | POA: Diagnosis not present

## 2020-05-01 DIAGNOSIS — F339 Major depressive disorder, recurrent, unspecified: Secondary | ICD-10-CM | POA: Diagnosis not present

## 2020-05-01 MED ORDER — ARIPIPRAZOLE 2 MG PO TABS
2.0000 mg | ORAL_TABLET | Freq: Every day | ORAL | 1 refills | Status: DC
Start: 2020-05-01 — End: 2020-07-04

## 2020-05-01 NOTE — Patient Instructions (Signed)
Start the Abilify one at night.  May increase after one week if no benefit to two tablets at night.   Let's plan on three week follow up.

## 2020-05-01 NOTE — Progress Notes (Signed)
Established Patient Office Visit  Subjective:  Patient ID: Richard Fernandez, male    DOB: 20-Feb-1941  Age: 79 y.o. MRN: [id number]  CC:  Chief Complaint  Patient presents with  . Anxiety    Pt wants to discuss anxiety medication     HPI Richard Fernandez presents for follow-up regarding depression and anxiety symptoms.  He has a long history of depression and anxiety which has been recurrent.  He also has history of posttraumatic stress disorder.  He was in [place].  He had recent event where he was served some papers by [place] that occurred a few months ago he has been extremely anxious intermittently since then.  His wife accompanies him today and she is not aware of any other specific stressors that are new other than just some chronic financial stressors.  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  He does have some daily anxiety symptoms but also increased depression symptoms.  No suicidal ideation.  He states he has very low motivation to get up and go several days.  Intermittent sleep disturbance.  Currently on fluoxetine 40 mg daily and also takes BuSpar 7.5 mg twice daily.  Recently had some transient paresthesias left upper extremity.  He recently saw neurology.  CT with contrast ordered but patient declined secondary to cost.  Recent non-contrast CT of head.    Past Medical History:  Diagnosis Date  . CEREBROVASCULAR ACCIDENT, HX OF 11/29/2010  . DEPRESSION 11/29/2010  . Hearing loss   . HYPERLIPIDEMIA 11/29/2010  . OBSTRUCTIVE SLEEP APNEA 11/29/2010   CPAP  . PTSD (post-traumatic stress disorder)   . Stroke [facility])     Past Surgical History:  Procedure Laterality Date  . [place]  . HAND SURGERY    . KNEE SURGERY    . SHOULDER ARTHROSCOPY WITH ROTATOR CUFF REPAIR      Family History  Adopted: Yes  Problem Relation Age of Onset  . Breast cancer Mother   . Heart failure Father     Social History   Socioeconomic History  . Marital  status: Married    Spouse name: [name]   . Number of children: 1  . Years of education: college  . Highest education level: Not on file  Occupational History  . Occupation: part time counselor   Tobacco Use  . Smoking status: Former Smoker    Packs/day: 1.50    Years: 20.00    Pack years: 30.00    Types: Cigarettes    Quit date: 11/04/1988    Years since quitting: 31.5  . Smokeless tobacco: Never Used  Vaping Use  . Vaping Use: Never used  Substance and Sexual Activity  . Alcohol use: No    Alcohol/week: 0.0 standard drinks  . Drug use: No  . Sexual activity: Not on file  Other Topics Concern  . Not on file  Social History Narrative   Pts is adopted. Patient lives at home with his [name].   Retired but still working some.   Education some college.   Left handed.   Caffeine four cups of coffee daily.      Social Determinants of Health   Financial Resource Strain:   . Difficulty of Paying Living Expenses:   Food Insecurity:   . Worried About Charity fundraiser in the Last Year:   . Arboriculturist in the Last Year:   Transportation Needs:   . Film/video editor (Medical):   Marland Kitchen Lack  of Transportation (Non-Medical):   Physical Activity:   . Days of Exercise per Week:   . Minutes of Exercise per Session:   Stress:   . Feeling of Stress :   Social Connections:   . Frequency of Communication with Friends and Family:   . Frequency of Social Gatherings with Friends and Family:   . Attends Religious Services:   . Active Member of Clubs or Organizations:   . Attends Archivist Meetings:   Marland Kitchen Marital Status:   Intimate Partner Violence:   . Fear of Current or Ex-Partner:   . Emotionally Abused:   Marland Kitchen Physically Abused:   . Sexually Abused:     Outpatient Medications Prior to Visit  Medication Sig Dispense Refill  . busPIRone (BUSPAR) 7.5 MG tablet Take 1 tablet (7.5 mg total) by mouth 2 (two) times daily. 180 tablet 1  . clopidogrel (PLAVIX) 75 MG tablet  Take 1 tablet (75 mg total) by mouth daily. 90 tablet 1  . fish oil-omega-3 fatty acids 1000 MG capsule Take 2 g by mouth 2 (two) times daily.     Marland Kitchen FLUoxetine (PROZAC) 40 MG capsule TAKE 1 CAPSULE(40 MG) BY MOUTH DAILY 90 capsule 1  . gabapentin (NEURONTIN) 100 MG capsule TAKE 1 CAPSULE(100 MG) BY MOUTH TWICE DAILY (Patient taking differently: Take 100 mg by mouth 2 (two) times daily. ) 180 capsule 1  . Hypromellose (ARTIFICIAL TEARS OP) Place 1 drop into both eyes daily as needed (dry eyes).    . modafinil (PROVIGIL) 100 MG tablet Take 1 tablet (100 mg total) by mouth daily as needed (each morning as needed). 30 tablet 5  . Multiple Vitamin (MULTI VITAMIN MENS PO) Take 1 tablet by mouth daily.     . mupirocin ointment (BACTROBAN) 2 % Place 1 application into the nose 2 (two) times daily. 22 g 0  . oxybutynin (DITROPAN-XL) 10 MG 24 hr tablet Take 10 mg by mouth at bedtime.   11  . simvastatin (ZOCOR) 40 MG tablet Take 1 tablet (40 mg total) by mouth daily. 90 tablet 0   No facility-administered medications prior to visit.    No Known Allergies  ROS Review of Systems  Constitutional: Negative for appetite change and unexpected weight change.  Respiratory: Negative for cough and shortness of breath.   Cardiovascular: Negative for chest pain.  Gastrointestinal: Negative for abdominal pain.  Genitourinary: Negative for dysuria.  Neurological: Negative for dizziness, seizures, speech difficulty and headaches.  Psychiatric/Behavioral: Positive for dysphoric mood. Negative for agitation and suicidal ideas. The patient is nervous/anxious.       Objective:    Physical Exam Vitals reviewed.  Constitutional:      Appearance: Normal appearance.  Cardiovascular:     Rate and Rhythm: Normal rate and regular rhythm.  Pulmonary:     Effort: Pulmonary effort is normal.     Breath sounds: Normal breath sounds.  Neurological:     General: No focal deficit present.     Mental Status: He is  alert.     Cranial Nerves: No cranial nerve deficit.     BP 116/62 (BP Location: Left Arm, Patient Position: Sitting, Cuff Size: Normal)   Pulse 74   Temp 98.1 F (36.7 C) (Temporal)   Wt 227 lb 11.2 oz (103.3 kg)   SpO2 95%   BMI 34.62 kg/m  Wt Readings from Last 3 Encounters:  05/01/20 227 lb 11.2 oz (103.3 kg)  04/10/20 222 lb 5 oz (100.8 kg)  03/20/20  231 lb 14.4 oz (105.2 kg)     Health Maintenance Due  Topic Date Due  . Hepatitis C Screening  Never done  . COVID-19 Vaccine (2 - Moderna 2-dose series) 02/12/2020    There are no preventive care reminders to display for this patient.  Lab Results  Component Value Date   TSH 1.39 01/03/2016   Lab Results  Component Value Date   WBC 3.6 (L) 03/11/2020   HGB 14.3 03/11/2020   HCT 42.0 03/11/2020   MCV 101.5 (H) 03/11/2020   PLT 209 03/11/2020   Lab Results  Component Value Date   NA 136 03/11/2020   K 4.1 03/11/2020   CO2 26 03/11/2020   GLUCOSE 110 (H) 03/11/2020   BUN 9 03/11/2020   CREATININE 0.60 (L) 03/11/2020   BILITOT 0.6 03/11/2020   ALKPHOS 47 03/11/2020   AST 15 03/11/2020   ALT 14 03/11/2020   PROT 6.8 03/11/2020   ALBUMIN 3.7 03/11/2020   CALCIUM 8.5 (L) 03/11/2020   ANIONGAP 10 03/11/2020   GFR 114.23 08/26/2018   Lab Results  Component Value Date   CHOL 168 08/26/2018   Lab Results  Component Value Date   HDL 51.30 08/26/2018   Lab Results  Component Value Date   LDLCALC 101 (H) 08/26/2018   Lab Results  Component Value Date   TRIG 79.0 08/26/2018   Lab Results  Component Value Date   CHOLHDL 3 08/26/2018   Lab Results  Component Value Date   HGBA1C (H) 05/20/2010    5.8 (NOTE)                                                                       According to the ADA Clinical Practice Recommendations for 2011, when HbA1c is used as a screening test:   >=6.5%   Diagnostic of Diabetes Mellitus           (if abnormal result  is confirmed)  5.7-6.4%   Increased risk of  developing Diabetes Mellitus  References:Diagnosis and Classification of Diabetes Mellitus,Diabetes PJAS,5053,97(QBHAL 1):S62-S69 and Standards of Medical Care in         Diabetes - 2011,Diabetes Care,2011,34  (Suppl 1):S11-S61.      Assessment & Plan:  History of recurrent depression.  Worsening depression symptoms over the past several weeks/months.   his depression is complicated by the fact that he has significant associated anxiety and history of posttraumatic stress disorder.  There has also been question raised in the past of possible bipolar 1 disorder.  Currently not on mood stabilizer  -We discussed starting low-dose Abilify 2 mg nightly and consider titration to 4 mg in 1 week if tolerating well -We recommend 3-week follow-up to reassess -Follow-up immediately for any worsening depression symptoms or other concerns   Meds ordered this encounter  Medications  . ARIPiprazole (ABILIFY) 2 MG tablet    Sig: Take 1 tablet (2 mg total) by mouth daily.    Dispense:  30 tablet    Refill:  1    Follow-up: Return in about 3 weeks (around 05/22/2020).    Carolann Littler, MD

## 2020-05-13 ENCOUNTER — Other Ambulatory Visit: Payer: Self-pay | Admitting: Family Medicine

## 2020-05-22 DIAGNOSIS — C44319 Basal cell carcinoma of skin of other parts of face: Secondary | ICD-10-CM | POA: Diagnosis not present

## 2020-06-14 ENCOUNTER — Other Ambulatory Visit: Payer: Self-pay | Admitting: Neurology

## 2020-06-15 ENCOUNTER — Other Ambulatory Visit: Payer: Self-pay | Admitting: Family Medicine

## 2020-06-19 ENCOUNTER — Telehealth: Payer: Self-pay | Admitting: Neurology

## 2020-06-19 NOTE — Telephone Encounter (Signed)
PA form sent from [facility], completed the form and sent to [facility] for them to process. Received confirmation fax that it went through. Will wait for response  Address according to paper for PA purposes is  [address] [place]

## 2020-06-26 IMAGING — CT CT HEAD W/O CM
3 series · 15 of 47 positions shown, 18 images · non-contrast
Comparison: October 07, 2016

CLINICAL DATA: Ataxia and left-sided weakness

EXAM:
CT HEAD WITHOUT CONTRAST
TECHNIQUE: Contiguous axial images were obtained from the base of the skull
through the vertex without intravenous contrast.

[Series 2: head wo · axial · 0.47mm/px · z∈[-186,-51]mm · 9 of 33 slices shown, 12 images]
[im 3/33  brain]
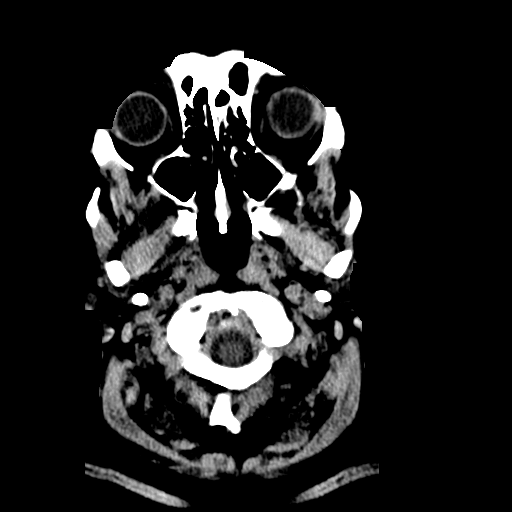
[im 3/33  bone]
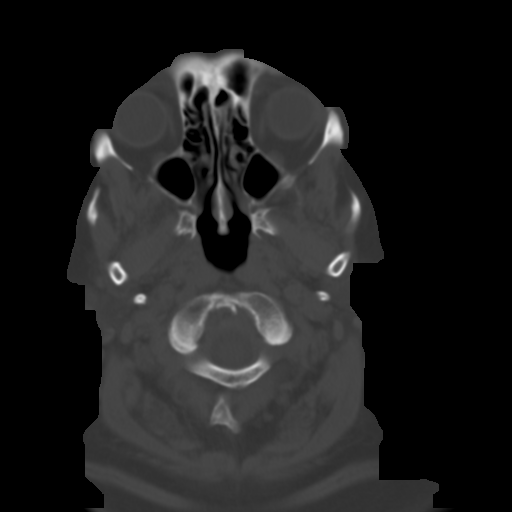
[im 6/33  brain]
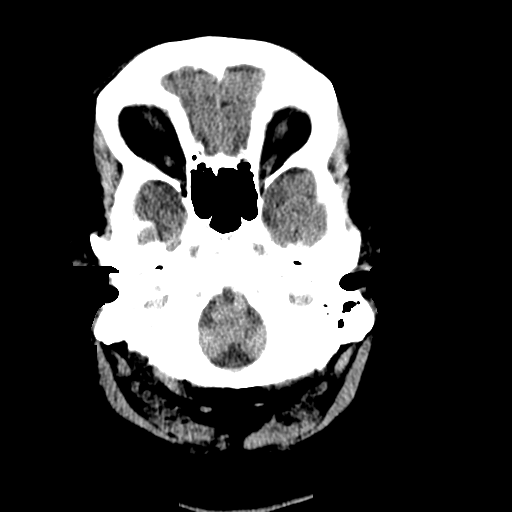
[im 9/33  brain]
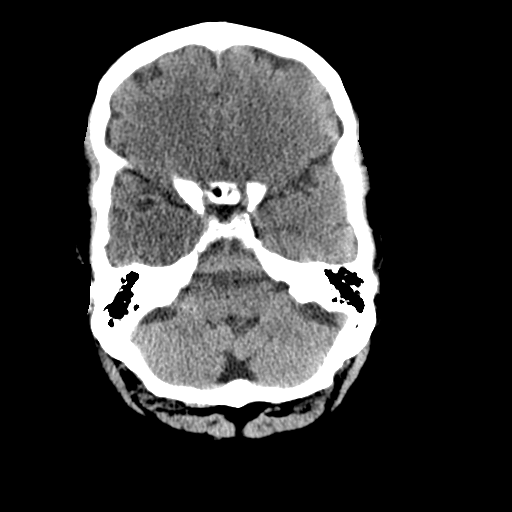
[im 13/33  brain]
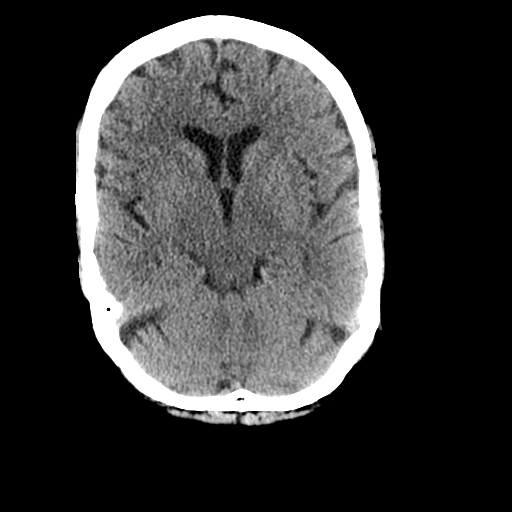
[im 17/33  brain]
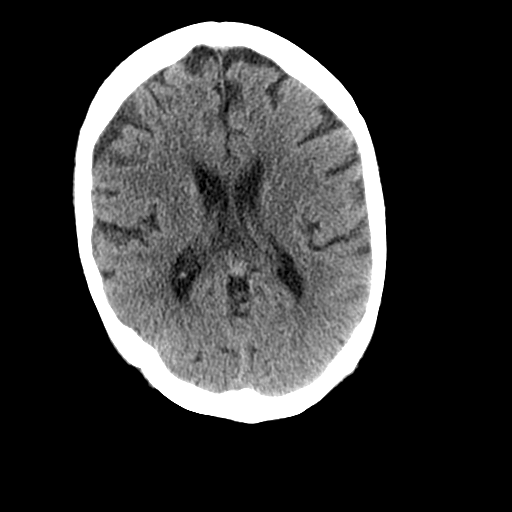
[im 17/33  bone]
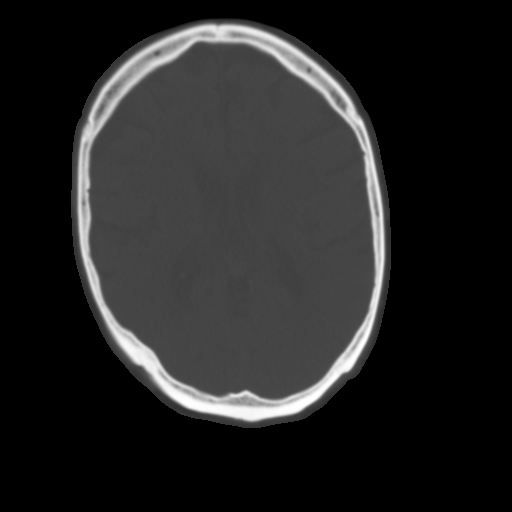
[im 20/33  brain]
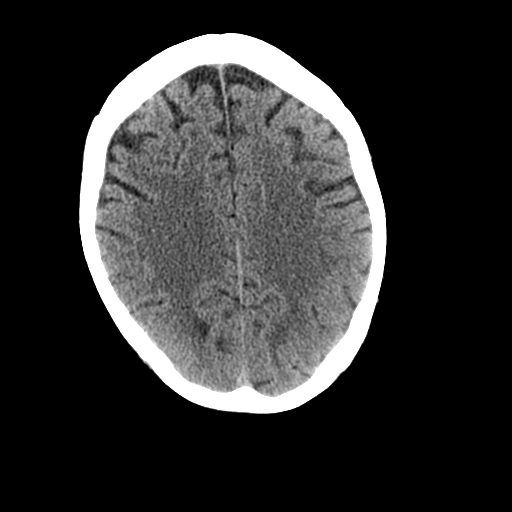
[im 24/33  brain]
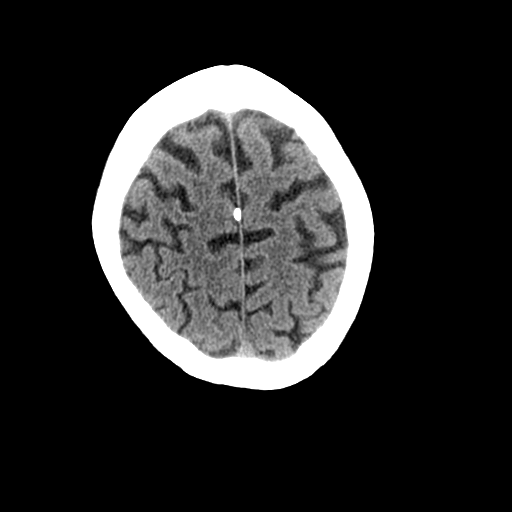
[im 27/33  brain]
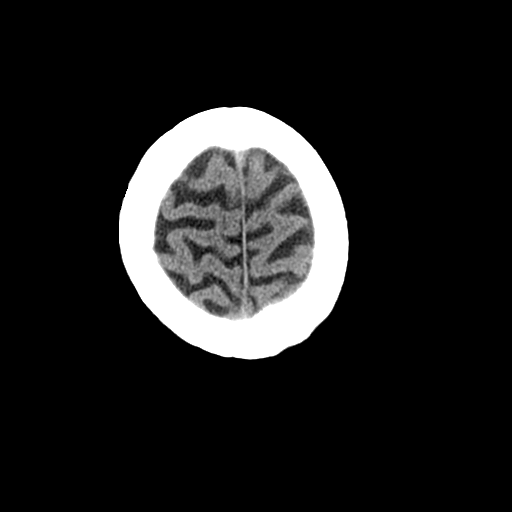
[im 30/33  brain]
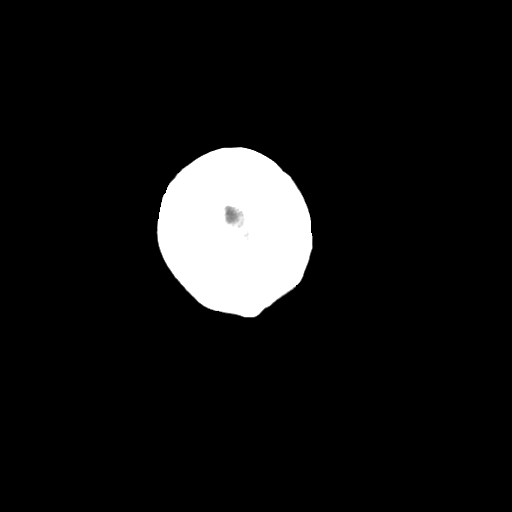
[im 30/33  bone]
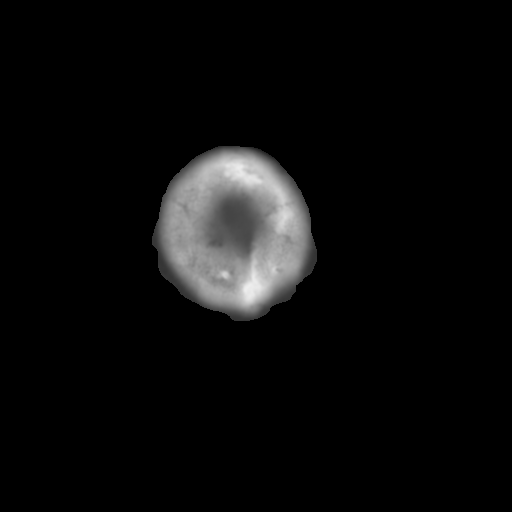

[Series 4: coronal soft tissue · coronal · 0.34mm/px · 3 of 65 slices shown]
[im 22/65  brain]
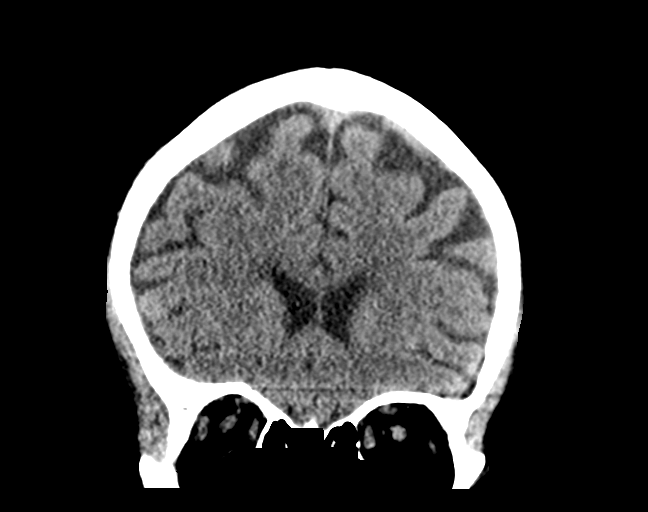
[im 29/65  brain]
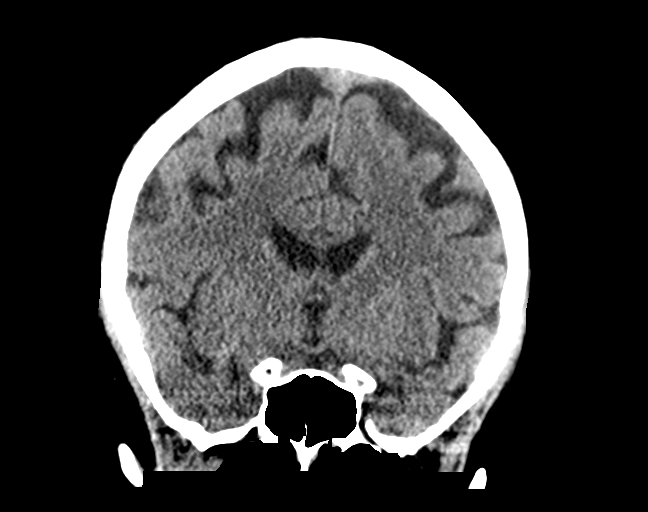
[im 36/65  brain]
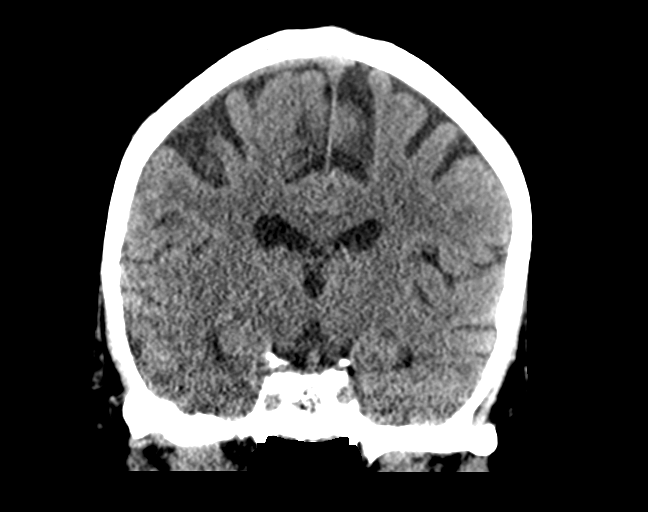

[Series 5: sagittal soft tissue · sagittal · 0.36mm/px · 3 of 51 slices shown]
[im 17/51  brain]
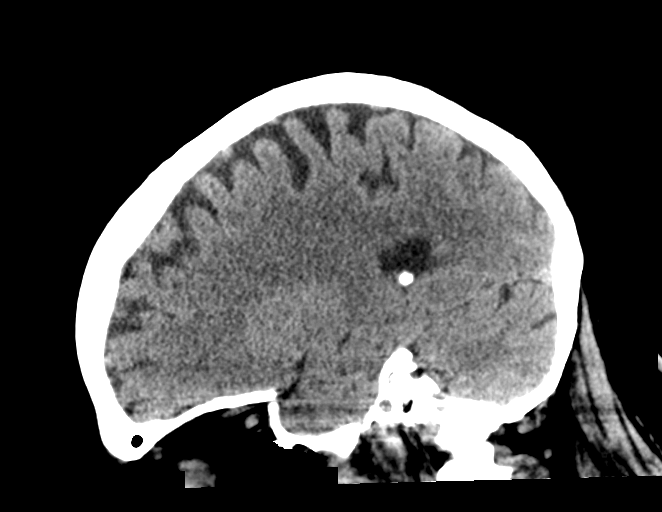
[im 26/51  brain]
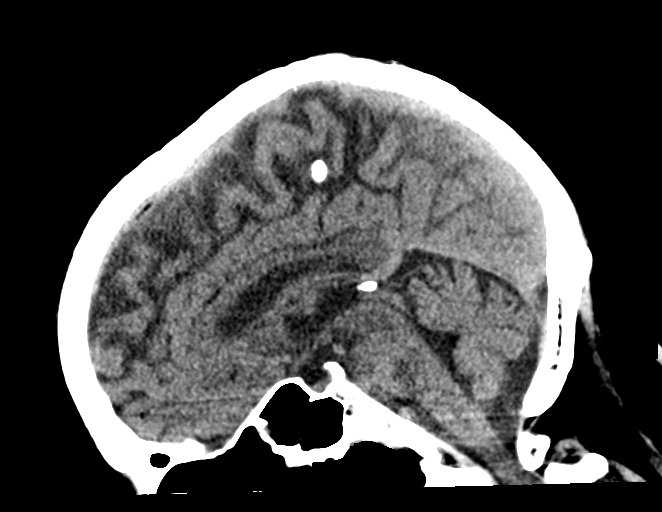
[im 34/51  brain]
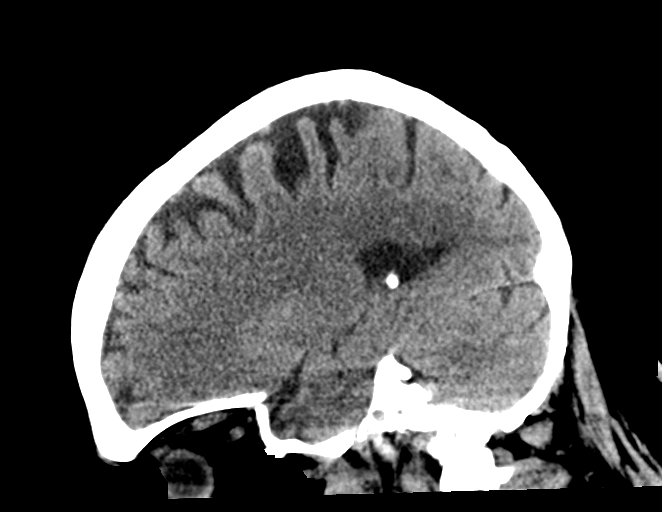

[15 of 47 positions shown; findings below may reference images not displayed]

FINDINGS: Brain: There is stable age related volume loss. There is no
intracranial mass, hemorrhage, extra-axial fluid collection, or
midline shift. There is mild small vessel disease in the centra
semiovale bilaterally. No acute appearing infarct is evident on this
study.

Vascular: There is no hyperdense vessel. No appreciable vascular
calcification evident.

Skull: The bony calvarium appears intact.

Sinuses/Orbits: There is mucosal thickening in several ethmoid air
cells. Other visualized paranasal sinuses are clear. Visualized
orbits appear symmetric bilaterally.

Other: Mastoid air cells are clear.
IMPRESSION: Age related volume loss with mild periventricular small vessel
disease. No acute infarct. No mass or hemorrhage.

There is mucosal thickening in several ethmoid air cells.

## 2020-07-03 ENCOUNTER — Other Ambulatory Visit: Payer: Self-pay | Admitting: Family Medicine

## 2020-07-25 ENCOUNTER — Ambulatory Visit: Payer: Medicare PPO | Admitting: Adult Health

## 2020-08-03 DIAGNOSIS — G4733 Obstructive sleep apnea (adult) (pediatric): Secondary | ICD-10-CM | POA: Diagnosis not present

## 2020-08-07 ENCOUNTER — Other Ambulatory Visit: Payer: Self-pay | Admitting: Family Medicine

## 2020-08-09 DIAGNOSIS — G4733 Obstructive sleep apnea (adult) (pediatric): Secondary | ICD-10-CM | POA: Diagnosis not present

## 2020-09-14 ENCOUNTER — Other Ambulatory Visit: Payer: Self-pay

## 2020-09-14 ENCOUNTER — Ambulatory Visit: Payer: Medicare PPO | Admitting: Adult Health

## 2020-09-14 ENCOUNTER — Encounter: Payer: Self-pay | Admitting: Adult Health

## 2020-09-14 VITALS — BP 98/58 | HR 60 | Temp 97.7°F | Ht 68.0 in | Wt 232.8 lb

## 2020-09-14 DIAGNOSIS — G43809 Other migraine, not intractable, without status migrainosus: Secondary | ICD-10-CM

## 2020-09-14 NOTE — Progress Notes (Signed)
Subjective:    Patient ID: Richard Fernandez, male    DOB: Aug 19, 1941, 79 y.o.   MRN: [id number]  HPI 79 year old male who  has a past medical history of CEREBROVASCULAR ACCIDENT, HX OF (11/29/2010), DEPRESSION (11/29/2010), Hearing loss, HYPERLIPIDEMIA (11/29/2010), OBSTRUCTIVE SLEEP APNEA (11/29/2010), PTSD (post-traumatic stress disorder), and Stroke ([place]).  He is a patient of Dr. Elease Hashimoto who I am seeing today for an acute visit.  He reports that yesterday around 11 AM he started to develop frontal headache and dizziness with associated nausea.  Reports the dizziness made him feel like the world was spinning.  He does have a history of CVA denies slurred speech, facial droop, loss of sensation, numbness and tingling in his upper extremities, changes in vision.  He rested most of yesterday and when he woke up this morning he felt "a lot better" but continued to have a very mild frontal headache.  Is no longer experiencing dizziness   Review of Systems See HPI   Past Medical History:  Diagnosis Date  . CEREBROVASCULAR ACCIDENT, HX OF 11/29/2010  . DEPRESSION 11/29/2010  . Hearing loss   . HYPERLIPIDEMIA 11/29/2010  . OBSTRUCTIVE SLEEP APNEA 11/29/2010   CPAP  . PTSD (post-traumatic stress disorder)   . Stroke [facility])     Social History   Socioeconomic History  . Marital status: Married    Spouse name: [name]   . Number of children: 1  . Years of education: college  . Highest education level: Not on file  Occupational History  . Occupation: part time counselor   Tobacco Use  . Smoking status: Former Smoker    Packs/day: 1.50    Years: 20.00    Pack years: 30.00    Types: Cigarettes    Quit date: 11/04/1988    Years since quitting: 31.8  . Smokeless tobacco: Never Used  Vaping Use  . Vaping Use: Never used  Substance and Sexual Activity  . Alcohol use: No    Alcohol/week: 0.0 standard drinks  . Drug use: No  . Sexual activity: Not on file  Other Topics Concern  . Not on file    Social History Narrative   Pts is adopted. Patient lives at home with his [name].   Retired but still working some.   Education some college.   Left handed.   Caffeine four cups of coffee daily.      Social Determinants of Health   Financial Resource Strain:   . Difficulty of Paying Living Expenses: Not on file  Food Insecurity:   . Worried About Charity fundraiser in the Last Year: Not on file  . Ran Out of Food in the Last Year: Not on file  Transportation Needs:   . Lack of Transportation (Medical): Not on file  . Lack of Transportation (Non-Medical): Not on file  Physical Activity:   . Days of Exercise per Week: Not on file  . Minutes of Exercise per Session: Not on file  Stress:   . Feeling of Stress : Not on file  Social Connections:   . Frequency of Communication with Friends and Family: Not on file  . Frequency of Social Gatherings with Friends and Family: Not on file  . Attends Religious Services: Not on file  . Active Member of Clubs or Organizations: Not on file  . Attends Archivist Meetings: Not on file  . Marital Status: Not on file  Intimate Partner Violence:   . Fear of Current or  Ex-Partner: Not on file  . Emotionally Abused: Not on file  . Physically Abused: Not on file  . Sexually Abused: Not on file    Past Surgical History:  Procedure Laterality Date  . [place]  . HAND SURGERY    . KNEE SURGERY    . SHOULDER ARTHROSCOPY WITH ROTATOR CUFF REPAIR      Family History  Adopted: Yes  Problem Relation Age of Onset  . Breast cancer Mother   . Heart failure Father     No Known Allergies  Current Outpatient Medications on File Prior to Visit  Medication Sig Dispense Refill  . ARIPiprazole (ABILIFY) 2 MG tablet TAKE 1 TABLET(2 MG) BY MOUTH DAILY 30 tablet 1  . busPIRone (BUSPAR) 7.5 MG tablet Take 1 tablet (7.5 mg total) by mouth 2 (two) times daily. 180 tablet 1  . clopidogrel (PLAVIX) 75 MG tablet TAKE 1 TABLET(75 MG) BY  MOUTH DAILY 90 tablet 1  . fish oil-omega-3 fatty acids 1000 MG capsule Take 2 g by mouth 2 (two) times daily.     Marland Kitchen FLUoxetine (PROZAC) 40 MG capsule TAKE 1 CAPSULE(40 MG) BY MOUTH DAILY 90 capsule 1  . gabapentin (NEURONTIN) 100 MG capsule TAKE 1 CAPSULE(100 MG) BY MOUTH TWICE DAILY 180 capsule 1  . Hypromellose (ARTIFICIAL TEARS OP) Place 1 drop into both eyes daily as needed (dry eyes).    . modafinil (PROVIGIL) 100 MG tablet TAKE 1 TABLET BY MOUTH EACH MORNING AS NEEDED 30 tablet 5  . Multiple Vitamin (MULTI VITAMIN MENS PO) Take 1 tablet by mouth daily.     . mupirocin ointment (BACTROBAN) 2 % Place 1 application into the nose 2 (two) times daily. 22 g 0  . oxybutynin (DITROPAN-XL) 10 MG 24 hr tablet Take 10 mg by mouth at bedtime.   11  . simvastatin (ZOCOR) 40 MG tablet TAKE 1 TABLET(40 MG) BY MOUTH DAILY 90 tablet 0   No current facility-administered medications on file prior to visit.    BP (!) 98/58 (BP Location: Left Arm, Patient Position: Sitting, Cuff Size: Normal)   Pulse 60   Temp 97.7 F (36.5 C) (Oral)   Ht 5\' 8"  (1.727 m)   Wt 232 lb 12.8 oz (105.6 kg)   SpO2 94%   BMI 35.40 kg/m       Objective:   Physical Exam Vitals and nursing note reviewed.  Constitutional:      Appearance: Normal appearance.  Eyes:     Extraocular Movements: Extraocular movements intact.     Right eye: Nystagmus (horizontal ) present.     Left eye: Nystagmus (horizontal ) present.     Pupils: Pupils are equal, round, and reactive to light.  Cardiovascular:     Rate and Rhythm: Normal rate and regular rhythm.     Pulses: Normal pulses.     Heart sounds: Normal heart sounds.  Musculoskeletal:        General: Normal range of motion.  Skin:    General: Skin is warm and dry.  Neurological:     General: No focal deficit present.     Mental Status: He is alert and oriented to person, place, and time.     Cranial Nerves: No cranial nerve deficit.     Sensory: No sensory deficit.      Gait: Gait normal.  Psychiatric:        Mood and Affect: Mood normal.        Behavior: Behavior normal.  Thought Content: Thought content normal.        Judgment: Judgment normal.       Assessment & Plan:  1. Vestibular migraine The symptoms seem to be resolving, no signs of acute CVA.  We discussed short course of meclizine but he would like to hold off on this for the time being.  We will follow-up if symptoms are not resolved by tomorrow.  Dorothyann Peng, NP

## 2020-09-19 ENCOUNTER — Other Ambulatory Visit: Payer: Self-pay | Admitting: Family Medicine

## 2020-09-23 ENCOUNTER — Other Ambulatory Visit: Payer: Self-pay | Admitting: Family Medicine

## 2020-10-24 ENCOUNTER — Other Ambulatory Visit: Payer: Self-pay

## 2020-10-24 MED ORDER — GABAPENTIN 100 MG PO CAPS: ORAL_CAPSULE | ORAL | 1 refills | Status: DC

## 2020-10-24 MED ORDER — FLUOXETINE HCL 40 MG PO CAPS: ORAL_CAPSULE | ORAL | 1 refills | Status: DC

## 2020-10-24 MED ORDER — SIMVASTATIN 40 MG PO TABS: ORAL_TABLET | ORAL | 0 refills | Status: DC

## 2020-10-24 MED ORDER — CLOPIDOGREL BISULFATE 75 MG PO TABS: ORAL_TABLET | ORAL | 1 refills | Status: DC

## 2020-10-24 MED ORDER — ARIPIPRAZOLE 2 MG PO TABS: ORAL_TABLET | ORAL | 2 refills | Status: DC

## 2020-10-24 NOTE — Telephone Encounter (Signed)
This encounter was created in error - please disregard.

## 2020-10-26 ENCOUNTER — Telehealth: Payer: Self-pay | Admitting: Neurology

## 2020-10-26 NOTE — Telephone Encounter (Signed)
Patient paged me. I called back and read Richard Fernandez's message to him that [facility] has active refills for $15. thanks

## 2020-10-26 NOTE — Telephone Encounter (Signed)
I also called [facility] and they have active refills and will fill his 30 day supply of Modafinil for $15. Attempted to reach pt.

## 2020-10-26 NOTE — Telephone Encounter (Signed)
Patient has refills at [facility] and Dr Elease Hashimoto refilled his Gabapentin to [facility] on 10/24/20.

## 2020-10-26 NOTE — Telephone Encounter (Signed)
Pt is needing a refill on his gabapentin (NEURONTIN) 100 MG capsule and his modafinil (PROVIGIL) 100 MG tablet sent in to the [facility] Pt has scheduled a f/u in the first week of March.

## 2020-10-31 ENCOUNTER — Other Ambulatory Visit: Payer: Self-pay | Admitting: Neurology

## 2020-10-31 MED ORDER — MODAFINIL 100 MG PO TABS: ORAL_TABLET | ORAL | 3 refills | Status: DC

## 2020-11-03 ENCOUNTER — Other Ambulatory Visit: Payer: Self-pay | Admitting: Family Medicine

## 2020-11-15 MED ORDER — MODAFINIL 100 MG PO TABS: ORAL_TABLET | ORAL | 3 refills | Status: DC

## 2020-11-15 NOTE — Telephone Encounter (Signed)
Patient reports that [facility] did not receive this prescription.  Can you please resend it?

## 2020-11-15 NOTE — Telephone Encounter (Signed)
I have sent the modafinil refill request to Dr. Brett Fairy to sign off on.

## 2020-11-15 NOTE — Telephone Encounter (Signed)
Pt. called & stated that [facility] also told him that doctor needs to contact insurance company for approval of medication. Pt. states he has [employer] & their number is [phone].

## 2020-11-15 NOTE — Telephone Encounter (Signed)
Pt. states Richard Fernandez [phone] informed him they never received the prescription for modafinil (PROVIGIL) 100 MG tablet & pt. is asking that it be resent. He states he is keeps falling asleep & needs this asap.

## 2020-11-15 NOTE — Addendum Note (Signed)
Addended by: Lester Belding A on: 11/15/2020 04:57 PM   Modules accepted: Orders

## 2020-11-15 NOTE — Telephone Encounter (Signed)
I called patient.  I advised him that Dr. Brett Fairy has sent in a refill of his modafinil to [facility].  Patient is asking me to urgently contact [employer] for prior authorization because he is out of modafinil.  I attempted a PA for modafinil on CMM several times.  Unfortunately, he is unable to be verified as eligible.    I called [employer]. I completed PA over the phone for modafinil. The turnaround time for this PA will be 72 hours. Ref # [id number].  I called patient. I advised him that the PA has been completed. I advised him that it will take 72 hours for a determination. Patient verbalized understanding.

## 2020-11-16 ENCOUNTER — Encounter: Payer: Self-pay | Admitting: *Deleted

## 2020-11-16 ENCOUNTER — Telehealth: Payer: Self-pay | Admitting: *Deleted

## 2020-11-16 NOTE — Telephone Encounter (Signed)
Received fax from [facility], Modafinil 100 mg tab approved until 11/03/21. Sent patient my chart to advise.

## 2020-11-21 NOTE — Telephone Encounter (Signed)
Received a call from [facility] that pt's modafinil was approved. Pt has been informed.

## 2020-11-22 ENCOUNTER — Ambulatory Visit: Payer: Self-pay

## 2020-12-21 ENCOUNTER — Telehealth: Payer: Self-pay | Admitting: Family Medicine

## 2020-12-21 NOTE — Telephone Encounter (Signed)
Pt is calling in inquiring about his spouse becoming a pt at [facility] he is aware that our office is not taking any new or transfer of care pts and he inquired about other [facility] taking newpt he was given other facilities numbers to see if they are accepting newpts.

## 2020-12-25 ENCOUNTER — Other Ambulatory Visit: Payer: Self-pay | Admitting: Neurology

## 2020-12-25 ENCOUNTER — Telehealth: Payer: Self-pay | Admitting: Family Medicine

## 2020-12-25 NOTE — Telephone Encounter (Signed)
Left message for patient to call back and schedule Medicare Annual Wellness Visit (AWV) either virtually or in office. No detailed message left    Last AWV 12/29/19  please schedule at anytime with [facility] Nurse Health Advisor 1 or 2   This should be a 45 minute visit. 

## 2020-12-30 ENCOUNTER — Other Ambulatory Visit: Payer: Self-pay | Admitting: Family Medicine

## 2021-01-04 ENCOUNTER — Encounter: Payer: Self-pay | Admitting: Neurology

## 2021-01-04 ENCOUNTER — Ambulatory Visit: Payer: Medicare HMO | Admitting: Neurology

## 2021-01-04 VITALS — BP 105/68 | HR 70 | Ht 68.0 in | Wt 231.0 lb

## 2021-01-04 DIAGNOSIS — Z6835 Body mass index (BMI) 35.0-35.9, adult: Secondary | ICD-10-CM

## 2021-01-04 DIAGNOSIS — Z9114 Patient's other noncompliance with medication regimen: Secondary | ICD-10-CM

## 2021-01-04 DIAGNOSIS — G4733 Obstructive sleep apnea (adult) (pediatric): Secondary | ICD-10-CM | POA: Diagnosis not present

## 2021-01-04 DIAGNOSIS — F339 Major depressive disorder, recurrent, unspecified: Secondary | ICD-10-CM | POA: Diagnosis not present

## 2021-01-04 DIAGNOSIS — F4310 Post-traumatic stress disorder, unspecified: Secondary | ICD-10-CM

## 2021-01-04 NOTE — Patient Instructions (Signed)
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
## 2021-01-08 ENCOUNTER — Telehealth: Payer: Self-pay

## 2021-01-08 NOTE — Telephone Encounter (Signed)
LVM for pt to call me back to schedule sleep study  

## 2021-01-11 ENCOUNTER — Other Ambulatory Visit: Payer: Self-pay | Admitting: Family Medicine

## 2021-01-19 ENCOUNTER — Other Ambulatory Visit: Payer: Self-pay | Admitting: Family Medicine

## 2021-02-08 ENCOUNTER — Other Ambulatory Visit: Payer: Self-pay | Admitting: Family Medicine

## 2021-02-16 ENCOUNTER — Other Ambulatory Visit: Payer: Self-pay | Admitting: Family Medicine

## 2021-02-20 NOTE — Telephone Encounter (Signed)
Received fax from [facility] for [place] request for Modafinil.  Called [facility] on [address] and spoke to pharmacy.  She stated when they try to use his [employer] it boots her out and directs her to use the [employer].  As of 01/04/21, our current insurance information is [employer].    She is checking with [facility] and will call back if there is any further problems.

## 2021-02-28 ENCOUNTER — Ambulatory Visit (INDEPENDENT_AMBULATORY_CARE_PROVIDER_SITE_OTHER): Payer: Medicare Other | Admitting: Family Medicine

## 2021-02-28 ENCOUNTER — Encounter: Payer: Self-pay | Admitting: Family Medicine

## 2021-02-28 ENCOUNTER — Other Ambulatory Visit: Payer: Self-pay

## 2021-02-28 VITALS — BP 118/60 | HR 56 | Temp 97.6°F | Ht 68.0 in | Wt 235.2 lb

## 2021-02-28 DIAGNOSIS — Z1159 Encounter for screening for other viral diseases: Secondary | ICD-10-CM | POA: Diagnosis not present

## 2021-02-28 DIAGNOSIS — Z0000 Encounter for general adult medical examination without abnormal findings: Secondary | ICD-10-CM | POA: Diagnosis not present

## 2021-02-28 LAB — CBC WITH DIFFERENTIAL/PLATELET
Basophils Absolute: 0 10*3/uL (ref 0.0–0.1)
Basophils Relative: 0.7 % (ref 0.0–3.0)
Eosinophils Absolute: 0 10*3/uL (ref 0.0–0.7)
Eosinophils Relative: 1 % (ref 0.0–5.0)
HCT: 41.6 % (ref 39.0–52.0)
Hemoglobin: 14.1 g/dL (ref 13.0–17.0)
Lymphocytes Relative: 26.5 % (ref 12.0–46.0)
Lymphs Abs: 1 10*3/uL (ref 0.7–4.0)
MCHC: 34 g/dL (ref 30.0–36.0)
MCV: 100.9 fl — ABNORMAL HIGH (ref 78.0–100.0)
Monocytes Absolute: 0.4 10*3/uL (ref 0.1–1.0)
Monocytes Relative: 10.3 % (ref 3.0–12.0)
Neutro Abs: 2.4 10*3/uL (ref 1.4–7.7)
Neutrophils Relative %: 61.5 % (ref 43.0–77.0)
Platelets: 179 10*3/uL (ref 150.0–400.0)
RBC: 4.13 Mil/uL — ABNORMAL LOW (ref 4.22–5.81)
RDW: 14 % (ref 11.5–15.5)
WBC: 3.9 10*3/uL — ABNORMAL LOW (ref 4.0–10.5)

## 2021-02-28 LAB — BASIC METABOLIC PANEL
BUN: 15 mg/dL (ref 6–23)
CO2: 28 mEq/L (ref 19–32)
Calcium: 8.7 mg/dL (ref 8.4–10.5)
Chloride: 100 mEq/L (ref 96–112)
Creatinine, Ser: 0.72 mg/dL (ref 0.40–1.50)
GFR: 86.66 mL/min (ref >60.00)
Glucose, Bld: 95 mg/dL (ref 70–99)
Potassium: 4.3 mEq/L (ref 3.5–5.1)
Sodium: 138 mEq/L (ref 135–145)

## 2021-02-28 LAB — HEPATIC FUNCTION PANEL
ALT: 13 U/L (ref 0–53)
AST: 14 U/L (ref 0–37)
Albumin: 3.9 g/dL (ref 3.5–5.2)
Alkaline Phosphatase: 60 U/L (ref 39–117)
Bilirubin, Direct: 0.1 mg/dL (ref 0.0–0.3)
Total Bilirubin: 0.5 mg/dL (ref 0.2–1.2)
Total Protein: 6.5 g/dL (ref 6.0–8.3)

## 2021-02-28 LAB — TSH: TSH: 3.08 u[IU]/mL (ref 0.35–4.50)

## 2021-02-28 LAB — LIPID PANEL
Cholesterol: 248 mg/dL — ABNORMAL HIGH (ref 0–200)
HDL: 46.6 mg/dL (ref >39.00)
LDL Cholesterol: 178 mg/dL — ABNORMAL HIGH (ref 0–99)
NonHDL: 201.28
Total CHOL/HDL Ratio: 5
Triglycerides: 114 mg/dL (ref 0.0–149.0)
VLDL: 22.8 mg/dL (ref 0.0–40.0)

## 2021-02-28 MED ORDER — SILDENAFIL CITRATE 50 MG PO TABS
50.0000 mg | ORAL_TABLET | Freq: Every day | ORAL | 11 refills | Status: DC | PRN
Start: 2021-02-28 — End: 2023-08-26

## 2021-02-28 NOTE — Addendum Note (Signed)
Addended by: Elmer Picker on: 02/28/2021 07:53 AM   Modules accepted: Orders

## 2021-02-28 NOTE — Progress Notes (Signed)
Established Patient Office Visit  Subjective:  Patient ID: Richard Fernandez, male    DOB: 04/14/1941  Age: 80 y.o. MRN: [id number]  CC:  Chief Complaint  Patient presents with  . Annual Exam    No new concerns    HPI Richard Fernandez presents for physical exam.  He has history of obstructive sleep apnea, bipolar disorder, history of recurrent depression, remote history of stroke, posttraumatic stress disorder.  He is followed by psychiatry and neurology.  He has no specific complaints today.  Health maintenance reviewed  -No further colonoscopies indicated at his age.  Previous colonoscopies reportedly showed no polyps. -No history of documented hepatitis C screening -Pneumonia vaccines complete -He had COVID-vaccine including 1 booster -Tetanus up-to-date and due 2030  Family history-unknown as he is adopted  Social history-is been married for 27 years.  This is second marriage.  He has 1 son from prior marriage.  No grandchildren.  Quit smoking 1990.  No alcohol.  Was in [place] for 18 months.  He was in the [employer] for 10 years.  Still works part-time sitting with patient's  Past Medical History:  Diagnosis Date  . CEREBROVASCULAR ACCIDENT, HX OF 11/29/2010  . DEPRESSION 11/29/2010  . Hearing loss   . HYPERLIPIDEMIA 11/29/2010  . OBSTRUCTIVE SLEEP APNEA 11/29/2010   CPAP  . PTSD (post-traumatic stress disorder)   . Stroke [facility])     Past Surgical History:  Procedure Laterality Date  . [place]  . HAND SURGERY    . KNEE SURGERY    . SHOULDER ARTHROSCOPY WITH ROTATOR CUFF REPAIR      Family History  Adopted: Yes  Problem Relation Age of Onset  . Breast cancer Mother   . Heart failure Father     Social History   Socioeconomic History  . Marital status: Married    Spouse name: [name]   . Number of children: 1  . Years of education: college  . Highest education level: Not on file  Occupational History  . Occupation: part time counselor   Tobacco Use  .  Smoking status: Former Smoker    Packs/day: 1.50    Years: 20.00    Pack years: 30.00    Types: Cigarettes    Quit date: 11/04/1988    Years since quitting: 32.3  . Smokeless tobacco: Never Used  Vaping Use  . Vaping Use: Never used  Substance and Sexual Activity  . Alcohol use: No    Alcohol/week: 0.0 standard drinks  . Drug use: No  . Sexual activity: Not on file  Other Topics Concern  . Not on file  Social History Narrative   Pts is adopted. Patient lives at home with his [name].   Retired but still working some.   Education some college.   Left handed.   Caffeine four cups of coffee daily.      Social Determinants of Health   Financial Resource Strain: Not on file  Food Insecurity: Not on file  Transportation Needs: Not on file  Physical Activity: Not on file  Stress: Not on file  Social Connections: Not on file  Intimate Partner Violence: Not on file    Outpatient Medications Prior to Visit  Medication Sig Dispense Refill  . ARIPiprazole (ABILIFY) 2 MG tablet TAKE 1 TABLET(2 MG) BY MOUTH DAILY 90 tablet 2  . busPIRone (BUSPAR) 7.5 MG tablet Take 1 tablet (7.5 mg total) by mouth 2 (two) times daily. 180 tablet 1  . clopidogrel (PLAVIX) 75  MG tablet TAKE 1 TABLET(75 MG) BY MOUTH DAILY 90 tablet 1  . fish oil-omega-3 fatty acids 1000 MG capsule Take 2 g by mouth 2 (two) times daily.     Marland Kitchen FLUoxetine (PROZAC) 40 MG capsule TAKE 1 CAPSULE(40 MG) BY MOUTH DAILY 90 capsule 1  . gabapentin (NEURONTIN) 100 MG capsule TAKE 1 CAPSULE(100 MG) BY MOUTH TWICE DAILY 180 capsule 0  . Hypromellose (ARTIFICIAL TEARS OP) Place 1 drop into both eyes daily as needed (dry eyes).    . modafinil (PROVIGIL) 100 MG tablet TAKE 1 TABLET BY MOUTH EACH MORNING AS NEEDED 30 tablet 3  . Multiple Vitamin (MULTI VITAMIN MENS PO) Take 1 tablet by mouth daily.     . simvastatin (ZOCOR) 40 MG tablet TAKE 1 TABLET(40 MG) BY MOUTH DAILY 90 tablet 0  . oxybutynin (DITROPAN-XL) 10 MG 24 hr tablet Take 10  mg by mouth at bedtime.   11   No facility-administered medications prior to visit.    No Known Allergies  ROS Review of Systems  Constitutional: Negative for fatigue.  HENT: Negative for trouble swallowing.   Eyes: Negative for visual disturbance.  Respiratory: Negative for cough, chest tightness and shortness of breath.   Cardiovascular: Negative for chest pain, palpitations and leg swelling.  Gastrointestinal: Negative for abdominal pain.  Endocrine: Negative for polydipsia and polyuria.  Genitourinary: Negative for dysuria and hematuria.  Neurological: Negative for dizziness, syncope, weakness, light-headedness and headaches.      Objective:    Physical Exam Vitals reviewed.  Constitutional:      Appearance: Normal appearance.  HENT:     Mouth/Throat:     Mouth: Mucous membranes are moist.     Pharynx: Oropharynx is clear.  Cardiovascular:     Rate and Rhythm: Normal rate and regular rhythm.  Pulmonary:     Effort: Pulmonary effort is normal.     Breath sounds: Normal breath sounds.  Abdominal:     Comments: Patient had difficulty getting on exam table.  We attempted exam with him seated which is less than ideal but no masses noted.  No tenderness.  Musculoskeletal:     Cervical back: Neck supple.     Right lower leg: No edema.     Left lower leg: No edema.  Neurological:     General: No focal deficit present.     Mental Status: He is alert.     BP 118/60 (BP Location: Left Arm, Patient Position: Sitting, Cuff Size: Normal)   Pulse (!) 56   Temp 97.6 F (36.4 C) (Oral)   Ht 5\' 8"  (1.727 m)   Wt 235 lb 3.2 oz (106.7 kg)   SpO2 95%   BMI 35.76 kg/m  Wt Readings from Last 3 Encounters:  02/28/21 235 lb 3.2 oz (106.7 kg)  01/04/21 231 lb (104.8 kg)  09/14/20 232 lb 12.8 oz (105.6 kg)     Health Maintenance Due  Topic Date Due  . Hepatitis C Screening  Never done  . COVID-19 Vaccine (3 - Moderna risk 4-dose series) 11/01/2020    There are no  preventive care reminders to display for this patient.  Lab Results  Component Value Date   TSH 1.39 01/03/2016   Lab Results  Component Value Date   WBC 3.6 (L) 03/11/2020   HGB 14.3 03/11/2020   HCT 42.0 03/11/2020   MCV 101.5 (H) 03/11/2020   PLT 209 03/11/2020   Lab Results  Component Value Date   NA 136 03/11/2020  K 4.1 03/11/2020   CO2 26 03/11/2020   GLUCOSE 110 (H) 03/11/2020   BUN 9 03/11/2020   CREATININE 0.60 (L) 03/11/2020   BILITOT 0.6 03/11/2020   ALKPHOS 47 03/11/2020   AST 15 03/11/2020   ALT 14 03/11/2020   PROT 6.8 03/11/2020   ALBUMIN 3.7 03/11/2020   CALCIUM 8.5 (L) 03/11/2020   ANIONGAP 10 03/11/2020   GFR 114.23 08/26/2018   Lab Results  Component Value Date   CHOL 168 08/26/2018   Lab Results  Component Value Date   HDL 51.30 08/26/2018   Lab Results  Component Value Date   LDLCALC 101 (H) 08/26/2018   Lab Results  Component Value Date   TRIG 79.0 08/26/2018   Lab Results  Component Value Date   CHOLHDL 3 08/26/2018   Lab Results  Component Value Date   HGBA1C (H) 05/20/2010    5.8 (NOTE)                                                                       According to the ADA Clinical Practice Recommendations for 2011, when HbA1c is used as a screening test:   >=6.5%   Diagnostic of Diabetes Mellitus           (if abnormal result  is confirmed)  5.7-6.4%   Increased risk of developing Diabetes Mellitus  References:Diagnosis and Classification of Diabetes Mellitus,Diabetes PNPY,0511,02(TRZNB 1):S62-S69 and Standards of Medical Care in         Diabetes - 2011,Diabetes Care,2011,34  (Suppl 1):S11-S61.      Assessment & Plan:   Problem List Items Addressed This Visit   None   Visit Diagnoses    Physical exam    -  Primary   Relevant Orders   Basic metabolic panel   Lipid panel   CBC with Differential/Platelet   TSH   Hepatic function panel   Hep C Antibody    Discussed several health maintenance issues  -Recommend  yearly flu vaccine -No further colonoscopies indicated -Check follow-up labs including hepatitis C antibody -Consider second COVID booster -No indications for PSA screening at his age -Discussed fall prevention -He is requesting refill of Viagra.  He has no contraindications.  No nitroglycerin use.  Meds ordered this encounter  Medications  . sildenafil (VIAGRA) 50 MG tablet    Sig: Take 1 tablet (50 mg total) by mouth daily as needed for erectile dysfunction.    Dispense:  10 tablet    Refill:  11    Follow-up: No follow-ups on file.    Carolann Littler, MD

## 2021-02-28 NOTE — Telephone Encounter (Signed)
Patient submitted new insurance form [employer] today.    PA Submitted to [employer]  Approved through 08/30/21 Reference # [id number]

## 2021-02-28 NOTE — Patient Instructions (Signed)
Preventive Care 65 Years and Older, Male Preventive care refers to lifestyle choices and visits with your health care provider that can promote health and wellness. This includes:  A yearly physical exam. This is also called an annual wellness visit.  Regular dental and eye exams.  Immunizations.  Screening for certain conditions.  Healthy lifestyle choices, such as: ? Eating a healthy diet. ? Getting regular exercise. ? Not using drugs or products that contain nicotine and tobacco. ? Limiting alcohol use. What can I expect for my preventive care visit? Physical exam Your health care provider will check your:  Height and weight. These may be used to calculate your BMI (body mass index). BMI is a measurement that tells if you are at a healthy weight.  Heart rate and blood pressure.  Body temperature.  Skin for abnormal spots. Counseling Your health care provider may ask you questions about your:  Past medical problems.  Family's medical history.  Alcohol, tobacco, and drug use.  Emotional well-being.  Home life and relationship well-being.  Sexual activity.  Diet, exercise, and sleep habits.  History of falls.  Memory and ability to understand (cognition).  Work and work environment.  Access to firearms. What immunizations do I need? Vaccines are usually given at various ages, according to a schedule. Your health care provider will recommend vaccines for you based on your age, medical history, and lifestyle or other factors, such as travel or where you work.   What tests do I need? Blood tests  Lipid and cholesterol levels. These may be checked every 5 years, or more often depending on your overall health.  Hepatitis C test.  Hepatitis B test. Screening  Lung cancer screening. You may have this screening every year starting at age 55 if you have a 30-pack-year history of smoking and currently smoke or have quit within the past 15 years.  Colorectal  cancer screening. ? All adults should have this screening starting at age 50 and continuing until age 75. ? Your health care provider may recommend screening at age 45 if you are at increased risk. ? You will have tests every 1-10 years, depending on your results and the type of screening test.  Prostate cancer screening. Recommendations will vary depending on your family history and other risks.  Genital exam to check for testicular cancer or hernias.  Diabetes screening. ? This is done by checking your blood sugar (glucose) after you have not eaten for a while (fasting). ? You may have this done every 1-3 years.  Abdominal aortic aneurysm (AAA) screening. You may need this if you are a current or former smoker.  STD (sexually transmitted disease) testing, if you are at risk. Follow these instructions at home: Eating and drinking  Eat a diet that includes fresh fruits and vegetables, whole grains, lean protein, and low-fat dairy products. Limit your intake of foods with high amounts of sugar, saturated fats, and salt.  Take vitamin and mineral supplements as recommended by your health care provider.  Do not drink alcohol if your health care provider tells you not to drink.  If you drink alcohol: ? Limit how much you have to 0-2 drinks a day. ? Be aware of how much alcohol is in your drink. In the [place] one drink equals one 12 oz bottle of beer (355 mL), one 5 oz glass of wine (148 mL), or one 1 oz glass of hard liquor (44 mL).   Lifestyle  Take daily care of your teeth   and gums. Brush your teeth every morning and night with fluoride toothpaste. Floss one time each day.  Stay active. Exercise for at least 30 minutes 5 or more days each week.  Do not use any products that contain nicotine or tobacco, such as cigarettes, e-cigarettes, and chewing tobacco. If you need help quitting, ask your health care provider.  Do not use drugs.  If you are sexually active, practice safe sex.  Use a condom or other form of protection to prevent STIs (sexually transmitted infections).  Talk with your health care provider about taking a low-dose aspirin or statin.  Find healthy ways to cope with stress, such as: ? Meditation, yoga, or listening to music. ? Journaling. ? Talking to a trusted person. ? Spending time with friends and family. Safety  Always wear your seat belt while driving or riding in a vehicle.  Do not drive: ? If you have been drinking alcohol. Do not ride with someone who has been drinking. ? When you are tired or distracted. ? While texting.  Wear a helmet and other protective equipment during sports activities.  If you have firearms in your house, make sure you follow all gun safety procedures. What's next?  Visit your health care provider once a year for an annual wellness visit.  Ask your health care provider how often you should have your eyes and teeth checked.  Stay up to date on all vaccines. This information is not intended to replace advice given to you by your health care provider. Make sure you discuss any questions you have with your health care provider. Document Revised: 07/20/2019 Document Reviewed: 10/15/2018 Elsevier Patient Education  2021 Elsevier Inc.  

## 2021-03-01 LAB — HEPATITIS C ANTIBODY
Hepatitis C Ab: NONREACTIVE
SIGNAL TO CUT-OFF: 0.31 (ref <1.00)

## 2021-03-28 ENCOUNTER — Telehealth: Payer: Self-pay | Admitting: Family Medicine

## 2021-03-28 NOTE — Telephone Encounter (Signed)
Left message for patient to call back and schedule Medicare Annual Wellness Visit (AWV) either virtually or in office.   Last AWV 12/29/19 please schedule at anytime with [facility] Nurse Health Advisor 1 or 2   This should be a 45 minute visit.

## 2021-03-30 DIAGNOSIS — M1712 Unilateral primary osteoarthritis, left knee: Secondary | ICD-10-CM | POA: Diagnosis not present

## 2021-04-16 ENCOUNTER — Telehealth: Payer: Self-pay | Admitting: Family Medicine

## 2021-04-16 NOTE — Telephone Encounter (Signed)
Ok for handicap form

## 2021-04-16 NOTE — Telephone Encounter (Signed)
Form has been placed in your red folder 

## 2021-04-16 NOTE — Telephone Encounter (Signed)
Patient is calling and wanted to see if provider can fill out handicap form and would like a call back when ready for pick up, please advise. CB is [phone]

## 2021-04-17 NOTE — Telephone Encounter (Signed)
Spoke with the patient. He is aware his form has been completed and has been placed up front for pick up. Nothing further needed at this time.

## 2021-04-17 NOTE — Telephone Encounter (Signed)
Left message for patient to call back  

## 2021-05-02 ENCOUNTER — Telehealth: Payer: Self-pay | Admitting: Family Medicine

## 2021-05-02 NOTE — Chronic Care Management (AMB) (Signed)
  Chronic Care Management   Note  05/02/2021 Name: Angie Hogg MRN: [id number] DOB: December 05, 1940  Davieon Stockham is a 80 y.o. year old male who is a primary care patient of Burchette, Alinda Sierras, MD. I reached out to [name] by phone today in response to a referral sent by Mr. Cindra Eves PCP, [name], MD.   Mr. Hogg was given information about Chronic Care Management services today including:  CCM service includes personalized support from designated clinical staff supervised by his physician, including individualized plan of care and coordination with other care providers 24/7 contact phone numbers for assistance for urgent and routine care needs. Service will only be billed when office clinical staff spend 20 minutes or more in a month to coordinate care. Only one practitioner may furnish and bill the service in a calendar month. The patient may stop CCM services at any time (effective at the end of the month) by phone call to the office staff.   Patient agreed to services and verbal consent obtained.   Follow up plan:   Tatjana Secretary/administrator

## 2021-05-09 ENCOUNTER — Telehealth: Payer: Self-pay | Admitting: Pharmacist

## 2021-05-09 NOTE — Chronic Care Management (AMB) (Signed)
    Chronic Care Management Pharmacy Assistant   Name: [name]  MRN: [id number] DOB: 10/30/1941  Reason for Encounter: Richard Fernandez Prep for CPP Visit on 07.07.2022   Conditions to be addressed/monitored: HLD and Depression  Primary concerns for visit include:   Recent office visits:  04.27.2022 [name], MD(PCP) patient was seen for annual physical. Medication prescribed sildenafil Citrate 50 mg Oral daily as needed.  Recent consult visits:  03.03.2022 Dohmeier, Asencion Partridge, MD Neurology patient seen for follow-up visit. Patient was seen for sleep apnea.  Hospital visits:  None in previous 6 months  Medications: Outpatient Encounter Medications as of 05/09/2021  Medication Sig Note   ARIPiprazole (ABILIFY) 2 MG tablet TAKE 1 TABLET(2 MG) BY MOUTH DAILY    busPIRone (BUSPAR) 7.5 MG tablet Take 1 tablet (7.5 mg total) by mouth 2 (two) times daily.    clopidogrel (PLAVIX) 75 MG tablet TAKE 1 TABLET(75 MG) BY MOUTH DAILY    fish oil-omega-3 fatty acids 1000 MG capsule Take 2 g by mouth 2 (two) times daily.     FLUoxetine (PROZAC) 40 MG capsule TAKE 1 CAPSULE(40 MG) BY MOUTH DAILY    gabapentin (NEURONTIN) 100 MG capsule TAKE 1 CAPSULE(100 MG) BY MOUTH TWICE DAILY    Hypromellose (ARTIFICIAL TEARS OP) Place 1 drop into both eyes daily as needed (dry eyes).    modafinil (PROVIGIL) 100 MG tablet TAKE 1 TABLET BY MOUTH EACH MORNING AS NEEDED 11/16/2020: 11/16/20 [employer] approved until 11/03/21.   Multiple Vitamin (MULTI VITAMIN MENS PO) Take 1 tablet by mouth daily.     sildenafil (VIAGRA) 50 MG tablet Take 1 tablet (50 mg total) by mouth daily as needed for erectile dysfunction.    simvastatin (ZOCOR) 40 MG tablet TAKE 1 TABLET(40 MG) BY MOUTH DAILY    No facility-administered encounter medications on file as of 05/09/2021.    Care Gaps:  Star Rating Drugs: Medication Dispensed  Quantity Pharmacy  Simvastatin 40 mg 07.04.2022 90 [facility]    Amilia ([place]) Mare Ferrari, [place]  Pharmacist Assistant [phone]

## 2021-05-10 ENCOUNTER — Ambulatory Visit (INDEPENDENT_AMBULATORY_CARE_PROVIDER_SITE_OTHER): Payer: Medicare Other | Admitting: Pharmacist

## 2021-05-10 ENCOUNTER — Other Ambulatory Visit: Payer: Self-pay

## 2021-05-10 DIAGNOSIS — E7849 Other hyperlipidemia: Secondary | ICD-10-CM

## 2021-05-10 DIAGNOSIS — F339 Major depressive disorder, recurrent, unspecified: Secondary | ICD-10-CM

## 2021-05-10 NOTE — Progress Notes (Signed)
Chronic Care Management Pharmacy Note  05/14/2021 Name:  Richard Fernandez MRN:  [id number] DOB:  24-Nov-1940  Summary: LDL not at goal < 70  Recommendations/Changes made from today's visit: -Recommend increasing to high intensity rosuvastatin 40 mg daily -Recommend new neurologist referral -Consider restarting buspirone as patient stopped taking and was unaware  Plan: Follow up in 1 month for adherence assessment   Subjective: Richard Fernandez is an 80 y.o. year old male who is a primary patient of Burchette, Alinda Sierras, MD.  The CCM team was consulted for assistance with disease management and care coordination needs.    Engaged with patient face to face for initial visit in response to provider referral for pharmacy case management and/or care coordination services.   Consent to Services:  The patient was given the following information about Chronic Care Management services today, agreed to services, and gave verbal consent: 1. CCM service includes personalized support from designated clinical staff supervised by the primary care provider, including individualized plan of care and coordination with other care providers 2. 24/7 contact phone numbers for assistance for urgent and routine care needs. 3. Service will only be billed when office clinical staff spend 20 minutes or more in a month to coordinate care. 4. Only one practitioner may furnish and bill the service in a calendar month. 5.The patient may stop CCM services at any time (effective at the end of the month) by phone call to the office staff. 6. The patient will be responsible for cost sharing (co-pay) of up to 20% of the service fee (after annual deductible is met). Patient agreed to services and consent obtained.  Patient Care Team: [name], MD as PCP - General (Family Medicine) Dohmeier, Asencion Partridge, MD as Consulting Physician (Neurology) Hortencia Pilar, MD as Consulting Physician (Ophthalmology) Trula Slade,  DPM as Consulting Physician (Podiatry) Viona Gilmore, [facility] as Pharmacist (Pharmacist)  Recent office visits: 02/28/21 [name], MD: patient presented for annual physical. Refilled sildenafil Citrate 50 mg Oral daily as needed.  Recent consult visits: 01/04/21 Dohmeier, Asencion Partridge, MD (Neurology): patient presented for sleep apnea follow-up visit. Patient stopped using his CPAP machine. Recommended to restart in order to get baseline to consider inspire.  Hospital visits: None in previous 6 months   Objective:  Lab Results  Component Value Date   CREATININE 0.72 02/28/2021   BUN 15 02/28/2021   GFR 86.66 02/28/2021   GFRNONAA >60 03/11/2020   GFRAA >60 03/11/2020   NA 138 02/28/2021   K 4.3 02/28/2021   CALCIUM 8.7 02/28/2021   CO2 28 02/28/2021   GLUCOSE 95 02/28/2021    Lab Results  Component Value Date/Time   HGBA1C (H) 05/20/2010 11:44 PM    5.8 (NOTE)                                                                       According to the ADA Clinical Practice Recommendations for 2011, when HbA1c is used as a screening test:   >=6.5%   Diagnostic of Diabetes Mellitus           (if abnormal result  is confirmed)  5.7-6.4%   Increased risk of developing Diabetes Mellitus  References:Diagnosis and Classification of Diabetes Mellitus,Diabetes  KKXF,8182,99(BZJIR 1):S62-S69 and Standards of Medical Care in         Diabetes - 2011,Diabetes CVEL,3810,17  (Suppl 1):S11-S61.   GFR 86.66 02/28/2021 07:53 AM   GFR 114.23 08/26/2018 08:58 AM    Last diabetic Eye exam:  Lab Results  Component Value Date/Time   HMDIABEYEEXA No Retinopathy 10/06/2017 12:00 AM    Last diabetic Foot exam: No results found for: HMDIABFOOTEX   Lab Results  Component Value Date   CHOL 248 (H) 02/28/2021   HDL 46.60 02/28/2021   LDLCALC 178 (H) 02/28/2021   TRIG 114.0 02/28/2021   CHOLHDL 5 02/28/2021    Hepatic Function Latest Ref Rng & Units 02/28/2021 03/11/2020 08/26/2018  Total Protein 6.0  - 8.3 g/dL 6.5 6.8 6.7  Albumin 3.5 - 5.2 g/dL 3.9 3.7 4.0  AST 0 - 37 U/L 14 15 16   ALT 0 - 53 U/L 13 14 18   Alk Phosphatase 39 - 117 U/L 60 47 52  Total Bilirubin 0.2 - 1.2 mg/dL 0.5 0.6 0.5  Bilirubin, Direct 0.0 - 0.3 mg/dL 0.1 - 0.1    Lab Results  Component Value Date/Time   TSH 3.08 02/28/2021 07:53 AM   TSH 1.39 01/03/2016 10:40 AM    CBC Latest Ref Rng & Units 02/28/2021 03/11/2020 03/11/2020  WBC 4.0 - 10.5 K/uL 3.9(L) - 3.6(L)  Hemoglobin 13.0 - 17.0 g/dL 14.1 14.3 13.8  Hematocrit 39.0 - 52.0 % 41.6 42.0 39.6  Platelets 150.0 - 400.0 K/uL 179.0 - 209    No results found for: VD25OH  Clinical ASCVD: Yes  The ASCVD Risk score Mikey Bussing DC Jr., et al., 2013) failed to calculate for the following reasons:   The 2013 ASCVD risk score is only valid for ages 37 to 6    Depression screen PHQ 2/9 12/29/2019 01/03/2016 01/03/2016  Decreased Interest 0 0 3  Down, Depressed, Hopeless 0 0 3  PHQ - 2 Score 0 0 6  Altered sleeping - - 0  Tired, decreased energy - - 0  Change in appetite - - 0  Feeling bad or failure about yourself  - - 0  Trouble concentrating - - 0  Moving slowly or fidgety/restless - - 0  Suicidal thoughts - - 0  PHQ-9 Score - - 6  Difficult doing work/chores - - Not difficult at all      Social History   Tobacco Use  Smoking Status Former   Packs/day: 1.50   Years: 20.00   Pack years: 30.00   Types: Cigarettes   Quit date: 11/04/1988   Years since quitting: 32.5  Smokeless Tobacco Never   BP Readings from Last 3 Encounters:  02/28/21 118/60  01/04/21 105/68  09/14/20 (!) 98/58   Pulse Readings from Last 3 Encounters:  02/28/21 (!) 56  01/04/21 70  09/14/20 60   Wt Readings from Last 3 Encounters:  02/28/21 235 lb 3.2 oz (106.7 kg)  01/04/21 231 lb (104.8 kg)  09/14/20 232 lb 12.8 oz (105.6 kg)   BMI Readings from Last 3 Encounters:  02/28/21 35.76 kg/m  01/04/21 35.12 kg/m  09/14/20 35.40 kg/m    Assessment/Interventions: Review of  patient past medical history, allergies, medications, health status, including review of consultants reports, laboratory and other test data, was performed as part of comprehensive evaluation and provision of chronic care management services.   SDOH:  (Social Determinants of Health) assessments and interventions performed: Yes SDOH Interventions    Flowsheet Row Most Recent Value  SDOH Interventions  Financial Strain Interventions Intervention Not Indicated  Transportation Interventions Intervention Not Indicated, Other (Comment)  [wife drives]      SDOH Screenings   Alcohol Screen: Not on file  Depression (PHQ2-9): Not on file  Financial Resource Strain: Low Risk    Difficulty of Paying Living Expenses: Not hard at all  Food Insecurity: Not on file  Housing: Not on file  Physical Activity: Not on file  Social Connections: Not on file  Stress: Not on file  Tobacco Use: Medium Risk   Smoking Tobacco Use: Former   Smokeless Tobacco Use: Never  Transportation Needs: No Transportation Needs   Lack of Transportation (Medical): No   Lack of Transportation (Non-Medical): No   Patient lives with his wife and they do not have family nearby. He does feel like he has a good circle of friends. He used to live in [place].  He admits that he doesn't have the greatest memory and his wife organizes his pillbox for him every Sunday but he still misses doses a couple times a week.  Patient does not have typical days but usually gets up at 6-7 am. 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   Patient has been eating out more lately and this usually involves him grabbing a burger on the run. He doesn't eat a lot of fried foods. He usually eats chicken, pork and some hamburgers at home. He doesn't eat fish and does about 60% of the cooking at home. They try to eat 1-2 vegetables a night.  CCM Care  Plan  No Known Allergies  Medications Reviewed Today     Reviewed by Viona Gilmore, [facility] (Pharmacist) on 05/14/21 at 1107  Med List Status: <None>   Medication Order Taking? Sig Documenting Provider Last Dose Status Informant  ARIPiprazole (ABILIFY) 2 MG tablet [id number] Yes TAKE 1 TABLET(2 MG) BY MOUTH DAILY Burchette, Alinda Sierras, MD Taking Active   busPIRone (BUSPAR) 7.5 MG tablet [id number] No Take 1 tablet (7.5 mg total) by mouth 2 (two) times daily.  Patient not taking: Reported on 05/10/2021   [name], MD Not Taking Active Multiple Informants  clopidogrel (PLAVIX) 75 MG tablet [id number] Yes TAKE 1 TABLET(75 MG) BY MOUTH DAILY Burchette, Alinda Sierras, MD Taking Active   fish oil-omega-3 fatty acids 1000 MG capsule [id number] Yes Take 2 g by mouth 2 (two) times daily.  [provider] Taking Active Multiple Informants  FLUoxetine (PROZAC) 40 MG capsule [id number] Yes TAKE 1 CAPSULE(40 MG) BY MOUTH DAILY Burchette, Alinda Sierras, MD Taking Active   gabapentin (NEURONTIN) 100 MG capsule [id number] Yes TAKE 1 CAPSULE(100 MG) BY MOUTH TWICE DAILY Burchette, Alinda Sierras, MD Taking Active   Hypromellose (ARTIFICIAL TEARS OP) [id number] Yes Place 1 drop into both eyes daily as needed (dry eyes). [provider] Taking Active Multiple Informants  modafinil (PROVIGIL) 100 MG tablet [id number] No TAKE 1 TABLET BY MOUTH EACH MORNING AS NEEDED  Patient not taking: Reported on 05/10/2021   Dohmeier, Asencion Partridge, MD Not Taking Active            Med Note Pollyann Kennedy Nov 16, 2020  7:11 AM) 11/16/20 [employer] approved until 11/03/21.  Multiple Vitamin (MULTI VITAMIN MENS PO) [id number] Yes Take 1 tablet by mouth daily.  [provider] Taking Active Multiple Informants  sildenafil (VIAGRA) 50 MG tablet  [id number] Yes Take 1 tablet (50 mg total) by mouth daily as needed for erectile dysfunction. [name], MD Taking Active   simvastatin (ZOCOR) 40 MG tablet [id number] Yes TAKE 1  TABLET(40 MG) BY MOUTH DAILY [name], MD Taking Active             Patient Active Problem List   Diagnosis Date Noted   SCCA (squamous cell carcinoma) of skin 08/11/2019   Bilateral foot-drop 02/24/2018   Left-sided weakness 10/07/2016   Weakness of left arm 10/07/2016   PTSD (post-traumatic stress disorder)    Bipolar I disorder ([place])    History of stroke    Morbid obesity due to excess calories ([place]) 12/27/2015   OSA on CPAP 12/27/2015   Memory difficulty 12/27/2015   Obesity (BMI 30-39.9) 10/18/2013   Hyperlipidemia 11/29/2010   Depression 11/29/2010   Obstructive sleep apnea 11/29/2010   History of cardiovascular disorder 11/29/2010    Immunization History  Administered Date(s) Administered   Influenza Split 07/25/2011, 08/12/2012   Influenza Whole 09/04/2010   Influenza, High Dose Seasonal PF 09/03/2013, 07/29/2018, 10/14/2019   Influenza,inj,Quad PF,6+ Mos 11/03/2014   Influenza-Unspecified 07/19/2016, 09/05/2017   Moderna SARS-COV2 Booster Vaccination 10/04/2020   Moderna Sars-Covid-2 Vaccination 12/17/2019, 01/15/2020   Pneumococcal Conjugate-13 11/03/2014   Pneumococcal Polysaccharide-23 11/04/2009   Td 11/04/2005   Tdap 06/28/2019   Zoster, Live 02/11/2009    Conditions to be addressed/monitored:  Hyperlipidemia, Depression, Anxiety, and Bipolar 1 disorder, history of stroke  Care Plan : [facility]  Updates made by Viona Gilmore, [place] since 05/14/2021 12:00 AM     Problem: Problem: Hyperlipidemia, Depression, Anxiety, and Bipolar 1 disorder, history of stroke      Long-Range Goal: Patient-Specific Goal   Start Date: 05/10/2021  Expected End Date: 05/10/2022  This Visit's Progress: On track  Priority: High  Note:   Current Barriers:  Unable to independently monitor therapeutic efficacy Unable to achieve control of cholesterol  Unable to self administer medications as prescribed  Pharmacist Clinical Goal(s):  Patient will  achieve adherence to monitoring guidelines and medication adherence to achieve therapeutic efficacy achieve control of cholesterol as evidenced by next lipid panel achieve ability to self administer medications as prescribed through use of setting alarms as evidenced by patient report through collaboration with PharmD and provider.   Interventions: 1:1 collaboration with [name], MD regarding development and update of comprehensive plan of care as evidenced by provider attestation and co-signature Inter-disciplinary care team collaboration (see longitudinal plan of care) Comprehensive medication review performed; medication list updated in electronic medical record  Hyperlipidemia: (LDL goal < 70) -Uncontrolled -Current treatment: Simvastatin 40 mg 1 tablet daily at bedtime Fish oil 1000 mg 2 capsules twice daily -Medications previously tried: none  -Current dietary patterns: eats out more frequently -Current exercise habits: nothing consistent -Educated on Cholesterol goals;  Benefits of statin for ASCVD risk reduction; Importance of limiting foods high in cholesterol; Exercise goal of 150 minutes per week; -Counseled on diet and exercise extensively Recommended increasing to high intensity statin therapy  History of stroke (Goal: prevent recurrent stroke) -Not ideally controlled -Current treatment  Simvastatin 40 mg 1 tablet daily Clopidogrel 75 mg 1 tablet daily -Medications previously tried: none  -Recommended to continue current medication Counseled on monitoring for signs of bleeding such as unexplained and excessive bleeding from a cut or injury, easy or excessive bruising, blood in urine or stools, and nosebleeds without a known cause Educated on importance  of taking the Plavix consistently for stroke prevention   Depression/Anxiety (Goal: minimize symptoms) -Uncontrolled -Current treatment: Buspirone 7.5 mg 1 tablet twice daily - not taking Fluoxetine 40 mg  1 capsule daily -Medications previously tried/failed: n/a -PHQ9: 0 -GAD7: n/a -Educated on Benefits of medication for symptom control -Recommended to continue current medication Collaborated with PCP about restarting buspirone  Bipolar 1 disorder (Goal: minimize symptoms) -Controlled -Current treatment  Aripiprazole 2 mg 1 tablet daily -Medications previously tried: none  -Recommended to continue current medication  Daytime sleepiness (Goal: minimize daytime sleepiness) -Uncontrolled -Current treatment  Modafinil 100 mg 1 tablet in the morning as needed - not taking -Medications previously tried: none  -Collaborated with PCP for new neurologist referral  Nerve pain (Goal: minimize pain) -Controlled -Current treatment  Gabapentin 100 mg 1 capsule twice daily -Medications previously tried: none  -Recommended to continue current medication   Health Maintenance -Vaccine gaps: shingrix, COVID booster  -Current therapy:  Sildenafil 50 mg 1 tablet as needed Multivitamin 1 tablet daily Artificial tears as needed -Educated on Cost vs benefit of each product must be carefully weighed by individual consumer -Patient is satisfied with current therapy and denies issues -Recommended to continue current medication  Patient Goals/Self-Care Activities Patient will:  - take medications as prescribed focus on medication adherence by setting an alarm as a reminder to take medications target a minimum of 150 minutes of moderate intensity exercise weekly engage in dietary modifications by limiting fast food  Follow Up Plan: Telephone follow up appointment with care management team member scheduled for: 3 months       Medication Assistance: None required.  Patient affirms current coverage meets needs.  Compliance/Adherence/Medication fill history: Care Gaps: Shingrix, COVID booster  Star-Rating Drugs: Simvastatin 40 mg - last filled 05/07/21 for 90 ds at [facility]  Patient's  preferred pharmacy is:  [facility] [place], [address] AT [address] [place] [address] Phone: [phone] Fax: [phone]  [facility] - [address], [address] [place] [address] Phone: [phone] Fax: [phone]  Uses pill box? Yes Pt endorses 70% compliance  We discussed: Benefits of medication synchronization, packaging and delivery as well as enhanced pharmacist oversight with [employer]. Patient decided to: Continue current medication management strategy  Care Plan and Follow Up Patient Decision:  Patient agrees to Care Plan and Follow-up.  Plan: Telephone follow up appointment with care management team member scheduled for:  3 months  Jeni Salles, PharmD, [place] Pharmacist [place] [facility] [place] [phone]

## 2021-05-11 ENCOUNTER — Telehealth: Payer: Self-pay | Admitting: Family Medicine

## 2021-05-11 NOTE — Telephone Encounter (Signed)
Left message for patient to call back and schedule Medicare Annual Wellness Visit (AWV) either virtually or in office.   Last AWV 12/29/19  please schedule at anytime with [facility] Nurse Health Advisor 1 or 2   This should be a 45 minute visit.

## 2021-05-14 NOTE — Patient Instructions (Signed)
Hi Richard Fernandez,  It was great to get to meet you in person! Below is a summary of some of the topics we discussed.   Please reach out to me if you have any questions or need anything before our follow up!  Best, Maddie  Jeni Salles, PharmD, [facility] [place]   Visit Information   Goals Addressed             This Visit's Progress    Manage My Medicine       Timeframe:  Short-Term Goal Priority:  High Start Date:                             Expected End Date:                       Follow Up Date 08/14/21    - call for medicine refill 2 or 3 days before it runs out - keep a list of all the medicines I take; vitamins and herbals too - use a pillbox to sort medicine - use an alarm clock or phone to remind me to take my medicine    Why is this important?   These steps will help you keep on track with your medicines.   Notes:         Patient Care Plan: CCM Pharmacy Care Plan     Problem Identified: Problem: Hyperlipidemia, Depression, Anxiety, and Bipolar 1 disorder, history of stroke      Long-Range Goal: Patient-Specific Goal   Start Date: 05/10/2021  Expected End Date: 05/10/2022  This Visit's Progress: On track  Priority: High  Note:   Current Barriers:  Unable to independently monitor therapeutic efficacy Unable to achieve control of cholesterol  Unable to self administer medications as prescribed  Pharmacist Clinical Goal(s):  Patient will achieve adherence to monitoring guidelines and medication adherence to achieve therapeutic efficacy achieve control of cholesterol as evidenced by next lipid panel achieve ability to self administer medications as prescribed through use of setting alarms as evidenced by patient report through collaboration with PharmD and provider.   Interventions: 1:1 collaboration with [name], MD regarding development and update of comprehensive plan of care as evidenced  by provider attestation and co-signature Inter-disciplinary care team collaboration (see longitudinal plan of care) Comprehensive medication review performed; medication list updated in electronic medical record  Hyperlipidemia: (LDL goal < 70) -Uncontrolled -Current treatment: Simvastatin 40 mg 1 tablet daily at bedtime Fish oil 1000 mg 2 capsules twice daily -Medications previously tried: none  -Current dietary patterns: eats out more frequently -Current exercise habits: nothing consistent -Educated on Cholesterol goals;  Benefits of statin for ASCVD risk reduction; Importance of limiting foods high in cholesterol; Exercise goal of 150 minutes per week; -Counseled on diet and exercise extensively Recommended increasing to high intensity statin therapy  History of stroke (Goal: prevent recurrent stroke) -Not ideally controlled -Current treatment  Simvastatin 40 mg 1 tablet daily Clopidogrel 75 mg 1 tablet daily -Medications previously tried: none  -Recommended to continue current medication Counseled on monitoring for signs of bleeding such as unexplained and excessive bleeding from a cut or injury, easy or excessive bruising, blood in urine or stools, and nosebleeds without a known cause Educated on importance of taking the Plavix consistently for stroke prevention   Depression/Anxiety (Goal: minimize symptoms) -Uncontrolled -Current treatment: Buspirone 7.5 mg 1 tablet twice daily - not taking  Fluoxetine 40 mg 1 capsule daily -Medications previously tried/failed: n/a -PHQ9: 0 -GAD7: n/a -Educated on Benefits of medication for symptom control -Recommended to continue current medication Collaborated with PCP about restarting buspirone  Bipolar 1 disorder (Goal: minimize symptoms) -Controlled -Current treatment  Aripiprazole 2 mg 1 tablet daily -Medications previously tried: none  -Recommended to continue current medication  Daytime sleepiness (Goal: minimize daytime  sleepiness) -Uncontrolled -Current treatment  Modafinil 100 mg 1 tablet in the morning as needed - not taking -Medications previously tried: none  -Collaborated with PCP for new neurologist referral  Nerve pain (Goal: minimize pain) -Controlled -Current treatment  Gabapentin 100 mg 1 capsule twice daily -Medications previously tried: none  -Recommended to continue current medication   Health Maintenance -Vaccine gaps: shingrix, COVID booster  -Current therapy:  Sildenafil 50 mg 1 tablet as needed Multivitamin 1 tablet daily Artificial tears as needed -Educated on Cost vs benefit of each product must be carefully weighed by individual consumer -Patient is satisfied with current therapy and denies issues -Recommended to continue current medication  Patient Goals/Self-Care Activities Patient will:  - take medications as prescribed focus on medication adherence by setting an alarm as a reminder to take medications target a minimum of 150 minutes of moderate intensity exercise weekly engage in dietary modifications by limiting fast food  Follow Up Plan: Telephone follow up appointment with care management team member scheduled for: 3 months      [name] was given information about Chronic Care Management services today including:  CCM service includes personalized support from designated clinical staff supervised by his physician, including individualized plan of care and coordination with other care providers 24/7 contact phone numbers for assistance for urgent and routine care needs. Standard insurance, coinsurance, copays and deductibles apply for chronic care management only during months in which we provide at least 20 minutes of these services. Most insurances cover these services at 100%, however patients may be responsible for any copay, coinsurance and/or deductible if applicable. This service may help you avoid the need for more expensive face-to-face services. Only  one practitioner may furnish and bill the service in a calendar month. The patient may stop CCM services at any time (effective at the end of the month) by phone call to the office staff.  Patient agreed to services and verbal consent obtained.   The patient verbalized understanding of instructions, educational materials, and care plan provided today and agreed to receive a mailed copy of patient instructions, educational materials, and care plan.  Telephone follow up appointment with pharmacy team member scheduled for: 3 months  Viona Gilmore, [facility]

## 2021-05-15 ENCOUNTER — Telehealth: Payer: Self-pay | Admitting: Pharmacist

## 2021-05-15 NOTE — Telephone Encounter (Signed)
Called patient to let him know that after discussion with PCP, he wants him to set up an office visit.  Scheduled patient for this Friday 7/15 to discuss neurology referral and possibly restarting buspirone. Message sent to PCP to make him aware.

## 2021-05-16 ENCOUNTER — Other Ambulatory Visit: Payer: Self-pay | Admitting: Family Medicine

## 2021-05-18 ENCOUNTER — Other Ambulatory Visit: Payer: Self-pay

## 2021-05-18 ENCOUNTER — Ambulatory Visit (INDEPENDENT_AMBULATORY_CARE_PROVIDER_SITE_OTHER): Payer: Medicare Other | Admitting: Family Medicine

## 2021-05-18 ENCOUNTER — Encounter: Payer: Self-pay | Admitting: Family Medicine

## 2021-05-18 VITALS — BP 130/62 | HR 69 | Temp 97.9°F | Wt 234.6 lb

## 2021-05-18 DIAGNOSIS — R400 Somnolence: Secondary | ICD-10-CM

## 2021-05-18 DIAGNOSIS — F419 Anxiety disorder, unspecified: Secondary | ICD-10-CM

## 2021-05-18 DIAGNOSIS — G4733 Obstructive sleep apnea (adult) (pediatric): Secondary | ICD-10-CM

## 2021-05-18 DIAGNOSIS — E7849 Other hyperlipidemia: Secondary | ICD-10-CM

## 2021-05-18 MED ORDER — MODAFINIL 100 MG PO TABS: ORAL_TABLET | ORAL | 3 refills | Status: DC

## 2021-05-18 MED ORDER — BUSPIRONE HCL 7.5 MG PO TABS: 7.5000 mg | ORAL_TABLET | Freq: Two times a day (BID) | ORAL | 5 refills | Status: DC

## 2021-05-18 NOTE — Progress Notes (Signed)
Established Patient Office Visit  Subjective:  Patient ID: Richard Fernandez, male    DOB: [birth year]  Age: 80 y.o. MRN: [id number]  CC:  Chief Complaint  Patient presents with   Follow-up    HPI Richard Fernandez presents for several items as follows  He has history of chronic problems including obstructive sleep apnea, history of cerebrovascular disease, hyperlipidemia, history of recurrent depression, history of posttraumatic stress disorder.  He had recent visit with clinical pharmacist.  History of chronic anxiety.  He was on low-dose BuSpar 7.5 mg twice daily and ran out.  He feels like his anxiety symptoms have increased since running out.  He is requesting refills.  He feels his depression symptoms are currently stable.  He has history of obstructive sleep apnea and had been prescribed modafinil 100 mg daily per neurology and is requesting refills.  He is requesting also referral to another neurologist.  He has seen neurologist with [facility] neurology for years but patient specifically would like to switch to a different neurologist.  He does have longstanding history of obstructive sleep apnea and apparently had some recent poor compliance with use of his CPAP.  He had recent labs and lipids were quite elevated.  He is supposed to be on simvastatin 40 mg daily but states that he frequently forgets to take this.  He is now using a pillbox.  We did discuss possible change to Crestor which would have fewer drug interactions and increased potency.  His lipids have not been to goal but is not clear if he has been compliant with taking medication regularly at any point.  Past Medical History:  Diagnosis Date   CEREBROVASCULAR ACCIDENT, HX OF 11/29/2010   DEPRESSION 11/29/2010   Hearing loss    HYPERLIPIDEMIA 11/29/2010   OBSTRUCTIVE SLEEP APNEA 11/29/2010   CPAP   PTSD (post-traumatic stress disorder)    Stroke [facility])     Past Surgical History:  Procedure Laterality Date   BACK SURGERY   1962   HAND SURGERY     KNEE SURGERY     SHOULDER ARTHROSCOPY WITH ROTATOR CUFF REPAIR      Family History  Adopted: Yes  Problem Relation Age of Onset   Breast cancer Mother    Heart failure Father     Social History   Socioeconomic History   Marital status: Married    Spouse name: [name]    Number of children: 1   Years of education: college   Highest education level: Not on file  Occupational History   Occupation: part time counselor   Tobacco Use   Smoking status: Former    Packs/day: 1.50    Years: 20.00    Pack years: 30.00    Types: Cigarettes    Quit date: 11/04/1988    Years since quitting: 32.5   Smokeless tobacco: Never  Vaping Use   Vaping Use: Never used  Substance and Sexual Activity   Alcohol use: No    Alcohol/week: 0.0 standard drinks   Drug use: No   Sexual activity: Not on file  Other Topics Concern   Not on file  Social History Narrative   Pts is adopted. Patient lives at home with his [name].   Retired but still working some.   Education some college.   Left handed.   Caffeine four cups of coffee daily.      Social Determinants of Health   Financial Resource Strain: Low Risk    Difficulty of Paying Living Expenses: Not  hard at all  Food Insecurity: Not on file  Transportation Needs: No Transportation Needs   Lack of Transportation (Medical): No   Lack of Transportation (Non-Medical): No  Physical Activity: Not on file  Stress: Not on file  Social Connections: Not on file  Intimate Partner Violence: Not on file    Outpatient Medications Prior to Visit  Medication Sig Dispense Refill   ARIPiprazole (ABILIFY) 2 MG tablet TAKE 1 TABLET(2 MG) BY MOUTH DAILY 90 tablet 2   clopidogrel (PLAVIX) 75 MG tablet TAKE 1 TABLET(75 MG) BY MOUTH DAILY 90 tablet 1   fish oil-omega-3 fatty acids 1000 MG capsule Take 2 g by mouth 2 (two) times daily.      FLUoxetine (PROZAC) 40 MG capsule TAKE 1 CAPSULE(40 MG) BY MOUTH DAILY 90 capsule 1    gabapentin (NEURONTIN) 100 MG capsule TAKE 1 CAPSULE(100 MG) BY MOUTH TWICE DAILY 180 capsule 0   Hypromellose (ARTIFICIAL TEARS OP) Place 1 drop into both eyes daily as needed (dry eyes).     Multiple Vitamin (MULTI VITAMIN MENS PO) Take 1 tablet by mouth daily.      sildenafil (VIAGRA) 50 MG tablet Take 1 tablet (50 mg total) by mouth daily as needed for erectile dysfunction. 10 tablet 11   simvastatin (ZOCOR) 40 MG tablet TAKE 1 TABLET(40 MG) BY MOUTH DAILY 90 tablet 0   busPIRone (BUSPAR) 7.5 MG tablet Take 1 tablet (7.5 mg total) by mouth 2 (two) times daily. 180 tablet 1   modafinil (PROVIGIL) 100 MG tablet TAKE 1 TABLET BY MOUTH EACH MORNING AS NEEDED 30 tablet 3   No facility-administered medications prior to visit.    No Known Allergies  ROS Review of Systems  Constitutional:  Negative for chills and fever.  Respiratory:  Negative for cough and shortness of breath.   Cardiovascular:  Negative for chest pain.  Gastrointestinal:  Negative for abdominal pain.  Genitourinary:  Negative for dysuria.  Psychiatric/Behavioral:  Negative for confusion. The patient is nervous/anxious.      Objective:    Physical Exam Vitals reviewed.  Constitutional:      General: He is not in acute distress.    Appearance: He is not ill-appearing.  Cardiovascular:     Rate and Rhythm: Normal rate and regular rhythm.  Pulmonary:     Effort: No respiratory distress.     Breath sounds: No wheezing or rales.  Neurological:     Mental Status: He is alert.  Psychiatric:        Mood and Affect: Mood normal.        Thought Content: Thought content normal.    BP 130/62 (BP Location: Left Arm, Patient Position: Sitting, Cuff Size: Normal)   Pulse 69   Temp 97.9 F (36.6 C) (Oral)   Wt 234 lb 9.6 oz (106.4 kg)   SpO2 95%   BMI 35.67 kg/m  Wt Readings from Last 3 Encounters:  05/18/21 234 lb 9.6 oz (106.4 kg)  02/28/21 235 lb 3.2 oz (106.7 kg)  01/04/21 231 lb (104.8 kg)     Health  Maintenance Due  Topic Date Due   Zoster Vaccines- Shingrix (1 of 2) Never done   COVID-19 Vaccine (4 - Booster for Moderna series) 01/02/2021    There are no preventive care reminders to display for this patient.  Lab Results  Component Value Date   TSH 3.08 02/28/2021   Lab Results  Component Value Date   WBC 3.9 (L) 02/28/2021   HGB 14.1  02/28/2021   HCT 41.6 02/28/2021   MCV 100.9 (H) 02/28/2021   PLT 179.0 02/28/2021   Lab Results  Component Value Date   NA 138 02/28/2021   K 4.3 02/28/2021   CO2 28 02/28/2021   GLUCOSE 95 02/28/2021   BUN 15 02/28/2021   CREATININE 0.72 02/28/2021   BILITOT 0.5 02/28/2021   ALKPHOS 60 02/28/2021   AST 14 02/28/2021   ALT 13 02/28/2021   PROT 6.5 02/28/2021   ALBUMIN 3.9 02/28/2021   CALCIUM 8.7 02/28/2021   ANIONGAP 10 03/11/2020   GFR 86.66 02/28/2021   Lab Results  Component Value Date   CHOL 248 (H) 02/28/2021   Lab Results  Component Value Date   HDL 46.60 02/28/2021   Lab Results  Component Value Date   LDLCALC 178 (H) 02/28/2021   Lab Results  Component Value Date   TRIG 114.0 02/28/2021   Lab Results  Component Value Date   CHOLHDL 5 02/28/2021   Lab Results  Component Value Date   HGBA1C (H) 05/20/2010    5.8 (NOTE)                                                                       According to the ADA Clinical Practice Recommendations for 2011, when HbA1c is used as a screening test:   >=6.5%   Diagnostic of Diabetes Mellitus           (if abnormal result  is confirmed)  5.7-6.4%   Increased risk of developing Diabetes Mellitus  References:Diagnosis and Classification of Diabetes Mellitus,Diabetes RAQT,6226,33(HLKTG 1):S62-S69 and Standards of Medical Care in         Diabetes - 2011,Diabetes Care,2011,34  (Suppl 1):S11-S61.      Assessment & Plan:   #1 history of chronic anxiety and depression.  Patient requesting refills of BuSpar.  He ran out recently and has had increased anxiety symptoms  since running out -Refill BuSpar 7.5 mg twice daily  #2 hyperlipidemia.  History of suboptimal control.  Goal LDL less than 70 with prior cerebrovascular history.  -We discussed stepping up compliance with statin.  We also discussed possible change to Crestor for increased potency but he states he just got his prescription filled for simvastatin.  He plans to finish out his current prescription but then consider change possibly to Crestor 20 mg daily  #3 history of obstructive sleep apnea.  Patient has longstanding history of daytime somnolence.  On modafinil. -Refill modafinil 100 mg daily until he can establish with another neurologist  Meds ordered this encounter  Medications   modafinil (PROVIGIL) 100 MG tablet    Sig: TAKE 1 TABLET BY MOUTH EACH MORNING AS NEEDED    Dispense:  30 tablet    Refill:  3   busPIRone (BUSPAR) 7.5 MG tablet    Sig: Take 1 tablet (7.5 mg total) by mouth 2 (two) times daily.    Dispense:  60 tablet    Refill:  5    Follow-up: Return in about 6 months (around 11/18/2021).    Carolann Littler, MD

## 2021-05-18 NOTE — Patient Instructions (Signed)
I refilled the Buspar and Modafinil  I will set up Neurology referral  Would consider change from Simvastatin to Crestor.  Let me know when currently prescription running out.   Try to take the Simvastatin daily.

## 2021-06-06 ENCOUNTER — Telehealth: Payer: Self-pay

## 2021-06-06 NOTE — Telephone Encounter (Signed)
Spoke with the patient. He is aware that we have no called him from our office. Nothing further needed.

## 2021-06-06 NOTE — Telephone Encounter (Signed)
Patient returning a call would like a call back

## 2021-06-13 ENCOUNTER — Other Ambulatory Visit: Payer: Self-pay

## 2021-06-22 ENCOUNTER — Other Ambulatory Visit: Payer: Self-pay | Admitting: Family Medicine

## 2021-06-27 ENCOUNTER — Other Ambulatory Visit: Payer: Self-pay

## 2021-06-27 ENCOUNTER — Ambulatory Visit: Payer: Medicare Other

## 2021-07-02 ENCOUNTER — Ambulatory Visit: Payer: Medicare Other

## 2021-07-12 ENCOUNTER — Telehealth: Payer: Self-pay

## 2021-07-12 NOTE — Telephone Encounter (Signed)
Called patient x3 for 815 AWV no answer no vm. Patient can reschedule for next available appointment.   L.Chiquita Heckert,LPN

## 2021-07-30 NOTE — Telephone Encounter (Signed)
Scheduled 08/02/21 

## 2021-08-02 ENCOUNTER — Ambulatory Visit (INDEPENDENT_AMBULATORY_CARE_PROVIDER_SITE_OTHER): Payer: Medicare Other

## 2021-08-02 ENCOUNTER — Other Ambulatory Visit: Payer: Self-pay

## 2021-08-02 VITALS — BP 110/64 | HR 59 | Temp 97.6°F | Ht 68.0 in | Wt 232.0 lb

## 2021-08-02 DIAGNOSIS — Z0000 Encounter for general adult medical examination without abnormal findings: Secondary | ICD-10-CM

## 2021-08-02 NOTE — Progress Notes (Signed)
Subjective:   Richard Fernandez is a 80 y.o. male who presents for an Initial Medicare Annual Wellness Visit.  Review of Systems    N/a       Objective:    There were no vitals filed for this visit. There is no height or weight on file to calculate BMI.  Advanced Directives 12/29/2019 10/07/2016  Does Patient Have a Medical Advance Directive? Yes No  Type of Advance Directive Living will;Healthcare Power of Attorney -  Does patient want to make changes to medical advance directive? No - Patient declined -  Copy of [place] in Chart? No - copy requested -  Would patient like information on creating a medical advance directive? - No - Patient declined    Current Medications (verified) Outpatient Encounter Medications as of 08/02/2021  Medication Sig   ARIPiprazole (ABILIFY) 2 MG tablet TAKE 1 TABLET(2 MG) BY MOUTH DAILY   busPIRone (BUSPAR) 7.5 MG tablet Take 1 tablet (7.5 mg total) by mouth 2 (two) times daily.   clopidogrel (PLAVIX) 75 MG tablet TAKE 1 TABLET(75 MG) BY MOUTH DAILY   fish oil-omega-3 fatty acids 1000 MG capsule Take 2 g by mouth 2 (two) times daily.    FLUoxetine (PROZAC) 40 MG capsule TAKE 1 CAPSULE(40 MG) BY MOUTH DAILY   gabapentin (NEURONTIN) 100 MG capsule TAKE 1 CAPSULE(100 MG) BY MOUTH TWICE DAILY   Hypromellose (ARTIFICIAL TEARS OP) Place 1 drop into both eyes daily as needed (dry eyes).   modafinil (PROVIGIL) 100 MG tablet TAKE 1 TABLET BY MOUTH EACH MORNING AS NEEDED   Multiple Vitamin (MULTI VITAMIN MENS PO) Take 1 tablet by mouth daily.    sildenafil (VIAGRA) 50 MG tablet Take 1 tablet (50 mg total) by mouth daily as needed for erectile dysfunction.   simvastatin (ZOCOR) 40 MG tablet TAKE 1 TABLET(40 MG) BY MOUTH DAILY   No facility-administered encounter medications on file as of 08/02/2021.    Allergies (verified) Patient has no known allergies.   History: Past Medical History:  Diagnosis Date   CEREBROVASCULAR ACCIDENT, HX OF  11/29/2010   DEPRESSION 11/29/2010   Hearing loss    HYPERLIPIDEMIA 11/29/2010   OBSTRUCTIVE SLEEP APNEA 11/29/2010   CPAP   PTSD (post-traumatic stress disorder)    Stroke [facility])    Past Surgical History:  Procedure Laterality Date   BACK SURGERY  1962   HAND SURGERY     KNEE SURGERY     SHOULDER ARTHROSCOPY WITH ROTATOR CUFF REPAIR     Family History  Adopted: Yes  Problem Relation Age of Onset   Breast cancer Mother    Heart failure Father    Social History   Socioeconomic History   Marital status: Married    Spouse name: [name]    Number of children: 1   Years of education: college   Highest education level: Not on file  Occupational History   Occupation: part time counselor   Tobacco Use   Smoking status: Former    Packs/day: 1.50    Years: 20.00    Pack years: 30.00    Types: Cigarettes    Quit date: 11/04/1988    Years since quitting: 32.7   Smokeless tobacco: Never  Vaping Use   Vaping Use: Never used  Substance and Sexual Activity   Alcohol use: No    Alcohol/week: 0.0 standard drinks   Drug use: No   Sexual activity: Not on file  Other Topics Concern   Not on file  Social History Narrative   Pts is adopted. Patient lives at home with his [name].   Retired but still working some.   Education some college.   Left handed.   Caffeine four cups of coffee daily.      Social Determinants of Health   Financial Resource Strain: Low Risk    Difficulty of Paying Living Expenses: Not hard at all  Food Insecurity: Not on file  Transportation Needs: No Transportation Needs   Lack of Transportation (Medical): No   Lack of Transportation (Non-Medical): No  Physical Activity: Not on file  Stress: Not on file  Social Connections: Not on file    Tobacco Counseling Counseling given: Not Answered   Clinical Intake:                 Diabetic?no         Activities of Daily Living No flowsheet data found.  Patient Care Team: [name], MD as PCP - General (Family Medicine) Dohmeier, Asencion Partridge, MD as Consulting Physician (Neurology) Hortencia Pilar, MD as Consulting Physician (Ophthalmology) Trula Slade, DPM as Consulting Physician (Podiatry) Viona Gilmore, [facility] as Pharmacist (Pharmacist)  Indicate any recent Medical Services you may have received from other than [facility] providers in the past year (date may be approximate).     Assessment:   This is a routine wellness examination for [facility].  Hearing/Vision screen No results found.  Dietary issues and exercise activities discussed:     Goals Addressed   None    Depression Screen PHQ 2/9 Scores 12/29/2019 01/03/2016 01/03/2016 11/03/2014  PHQ - 2 Score 0 0 6 0  PHQ- 9 Score - - 6 -    Fall Risk Fall Risk  12/29/2019 08/26/2018 01/03/2016 01/03/2016 11/03/2014  Falls in the past year? 1 Yes Yes No Yes  Number falls in past yr: 1 1 1  - 1  Injury with Fall? 0 No No - Yes  Risk for fall due to : History of fall(s);Impaired mobility;Impaired balance/gait - History of fall(s);Impaired balance/gait - -  Follow up Education provided;Falls prevention discussed;Falls evaluation completed - Falls prevention discussed - -    FALL RISK PREVENTION PERTAINING TO THE HOME:  Any stairs in or around the home? No  If so, are there any without handrails? No  Home free of loose throw rugs in walkways, pet beds, electrical cords, etc? Yes  Adequate lighting in your home to reduce risk of falls? Yes   ASSISTIVE DEVICES UTILIZED TO PREVENT FALLS:  Life alert? No  Use of a cane, walker or w/c? Yes  Grab bars in the bathroom? Yes  Shower chair or bench in shower? Yes  Elevated toilet seat or a handicapped toilet? Yes   TIMED UP AND GO:  Was the test performed? Yes .  Length of time to ambulate 10 feet: 15 sec.   Gait steady and fast without use of assistive device  Cognitive Function:  Normal cognitive status assessed by direct observation by this Nurse  Health Advisor. No abnormalities found.     6CIT Screen 12/29/2019  What Year? 0 points  What month? 0 points  What time? 0 points  Count back from 20 0 points  Months in reverse 2 points  Repeat phrase 2 points  Total Score 4    Immunizations Immunization History  Administered Date(s) Administered   Influenza Split 07/25/2011, 08/12/2012   Influenza Whole 09/04/2010   Influenza, High Dose Seasonal PF 09/03/2013, 07/29/2018, 10/14/2019   Influenza,inj,Quad  PF,6+ Mos 11/03/2014   Influenza-Unspecified 07/19/2016, 09/05/2017   Moderna SARS-COV2 Booster Vaccination 10/04/2020   Moderna Sars-Covid-2 Vaccination 12/17/2019, 01/15/2020   Pneumococcal Conjugate-13 11/03/2014   Pneumococcal Polysaccharide-23 11/04/2009   Td 11/04/2005   Tdap 06/28/2019   Zoster, Live 02/11/2009    TDAP status: Up to date  Flu Vaccine status: Up to date  Pneumococcal vaccine status: Up to date  Covid-19 vaccine status: Completed vaccines  Qualifies for Shingles Vaccine? Yes   Zostavax completed No   Shingrix Completed?: No.    Education has been provided regarding the importance of this vaccine. Patient has been advised to call insurance company to determine out of pocket expense if they have not yet received this vaccine. Advised may also receive vaccine at local pharmacy or Health Dept. Verbalized acceptance and understanding.  Screening Tests Health Maintenance  Topic Date Due   Zoster Vaccines- Shingrix (1 of 2) Never done   COVID-19 Vaccine (4 - Booster for Moderna series) 12/27/2020   INFLUENZA VACCINE  06/04/2021   TETANUS/TDAP  06/27/2029   HPV VACCINES  Aged Out    Health Maintenance  Health Maintenance Due  Topic Date Due   Zoster Vaccines- Shingrix (1 of 2) Never done   COVID-19 Vaccine (4 - Booster for Moderna series) 12/27/2020   INFLUENZA VACCINE  06/04/2021    Colorectal cancer screening: No longer required.   Lung Cancer Screening: (Low Dose CT Chest recommended if  Age 7-80 years, 30 pack-year currently smoking OR have quit w/in 15years.) does not qualify.   Lung Cancer Screening Referral: n/a  Additional Screening:  Hepatitis C Screening: does not qualify;   Vision Screening: Recommended annual ophthalmology exams for early detection of glaucoma and other disorders of the eye. Is the patient up to date with their annual eye exam?  Yes  Who is the provider or what is the name of the office in which the patient attends annual eye exams? Dr.Weaver  If pt is not established with a provider, would they like to be referred to a provider to establish care? No .   Dental Screening: Recommended annual dental exams for proper oral hygiene  Community Resource Referral / Chronic Care Management: CRR required this visit?  No   CCM required this visit?  No      Plan:     I have personally reviewed and noted the following in the patient's chart:   Medical and social history Use of alcohol, tobacco or illicit drugs  Current medications and supplements including opioid prescriptions. Patient is not currently taking opioid prescriptions. Functional ability and status Nutritional status Physical activity Advanced directives List of other physicians Hospitalizations, surgeries, and ER visits in previous 12 months Vitals Screenings to include cognitive, depression, and falls Referrals and appointments  In addition, I have reviewed and discussed with patient certain preventive protocols, quality metrics, and best practice recommendations. A written personalized care plan for preventive services as well as general preventive health recommendations were provided to patient.     [name], LPN   [id number]   Nurse Notes: none

## 2021-08-02 NOTE — Patient Instructions (Signed)
Richard Fernandez , Thank you for taking time to come for your Medicare Wellness Visit. I appreciate your ongoing commitment to your health goals. Please review the following plan we discussed and let me know if I can assist you in the future.   Screening recommendations/referrals: Colonoscopy: no longer required  Recommended yearly ophthalmology/optometry visit for glaucoma screening and checkup Recommended yearly dental visit for hygiene and checkup  Vaccinations: Influenza vaccine: due in fall 2022  Pneumococcal vaccine: completed series  Tdap vaccine: 06/28/2019 Shingles vaccine: will consider     Advanced directives: will provide copies   Conditions/risks identified: none   Next appointment: none   Preventive Care 80 Years and Older, Male Preventive care refers to lifestyle choices and visits with your health care provider that can promote health and wellness. What does preventive care include? A yearly physical exam. This is also called an annual well check. Dental exams once or twice a year. Routine eye exams. Ask your health care provider how often you should have your eyes checked. Personal lifestyle choices, including: Daily care of your teeth and gums. Regular physical activity. Eating a healthy diet. Avoiding tobacco and drug use. Limiting alcohol use. Practicing safe sex. Taking low doses of aspirin every day. Taking vitamin and mineral supplements as recommended by your health care provider. What happens during an annual well check? The services and screenings done by your health care provider during your annual well check will depend on your age, overall health, lifestyle risk factors, and family history of disease. Counseling  Your health care provider may ask you questions about your: Alcohol use. Tobacco use. Drug use. Emotional well-being. Home and relationship well-being. Sexual activity. Eating habits. History of falls. Memory and ability to understand  (cognition). Work and work Statistician. Screening  You may have the following tests or measurements: Height, weight, and BMI. Blood pressure. Lipid and cholesterol levels. These may be checked every 5 years, or more frequently if you are over 27 years old. Skin check. Lung cancer screening. You may have this screening every year starting at age 29 if you have a 30-pack-year history of smoking and currently smoke or have quit within the past 15 years. Fecal occult blood test (FOBT) of the stool. You may have this test every year starting at age 81. Flexible sigmoidoscopy or colonoscopy. You may have a sigmoidoscopy every 5 years or a colonoscopy every 10 years starting at age 50. Prostate cancer screening. Recommendations will vary depending on your family history and other risks. Hepatitis C blood test. Hepatitis B blood test. Sexually transmitted disease (STD) testing. Diabetes screening. This is done by checking your blood sugar (glucose) after you have not eaten for a while (fasting). You may have this done every 1-3 years. Abdominal aortic aneurysm (AAA) screening. You may need this if you are a current or former smoker. Osteoporosis. You may be screened starting at age 63 if you are at high risk. Talk with your health care provider about your test results, treatment options, and if necessary, the need for more tests. Vaccines  Your health care provider may recommend certain vaccines, such as: Influenza vaccine. This is recommended every year. Tetanus, diphtheria, and acellular pertussis (Tdap, Td) vaccine. You may need a Td booster every 10 years. Zoster vaccine. You may need this after age 80. Pneumococcal 13-valent conjugate (PCV13) vaccine. One dose is recommended after age 80. Pneumococcal polysaccharide (PPSV23) vaccine. One dose is recommended after age 72. Talk to your health care provider about which  screenings and vaccines you need and how often you need them. This  information is not intended to replace advice given to you by your health care provider. Make sure you discuss any questions you have with your health care provider. Document Released: 11/17/2015 Document Revised: 07/10/2016 Document Reviewed: 08/22/2015 Elsevier Interactive Patient Education  2017 [place] Prevention in the Home Falls can cause injuries. They can happen to people of all ages. There are many things you can do to make your home safe and to help prevent falls. What can I do on the outside of my home? Regularly fix the edges of walkways and driveways and fix any cracks. Remove anything that might make you trip as you walk through a door, such as a raised step or threshold. Trim any bushes or trees on the path to your home. Use bright outdoor lighting. Clear any walking paths of anything that might make someone trip, such as rocks or tools. Regularly check to see if handrails are loose or broken. Make sure that both sides of any steps have handrails. Any raised decks and porches should have guardrails on the edges. Have any leaves, snow, or ice cleared regularly. Use sand or salt on walking paths during winter. Clean up any spills in your garage right away. This includes oil or grease spills. What can I do in the bathroom? Use night lights. Install grab bars by the toilet and in the tub and shower. Do not use towel bars as grab bars. Use non-skid mats or decals in the tub or shower. If you need to sit down in the shower, use a plastic, non-slip stool. Keep the floor dry. Clean up any water that spills on the floor as soon as it happens. Remove soap buildup in the tub or shower regularly. Attach bath mats securely with double-sided non-slip rug tape. Do not have throw rugs and other things on the floor that can make you trip. What can I do in the bedroom? Use night lights. Make sure that you have a light by your bed that is easy to reach. Do not use any sheets or  blankets that are too big for your bed. They should not hang down onto the floor. Have a firm chair that has side arms. You can use this for support while you get dressed. Do not have throw rugs and other things on the floor that can make you trip. What can I do in the kitchen? Clean up any spills right away. Avoid walking on wet floors. Keep items that you use a lot in easy-to-reach places. If you need to reach something above you, use a strong step stool that has a grab bar. Keep electrical cords out of the way. Do not use floor polish or wax that makes floors slippery. If you must use wax, use non-skid floor wax. Do not have throw rugs and other things on the floor that can make you trip. What can I do with my stairs? Do not leave any items on the stairs. Make sure that there are handrails on both sides of the stairs and use them. Fix handrails that are broken or loose. Make sure that handrails are as long as the stairways. Check any carpeting to make sure that it is firmly attached to the stairs. Fix any carpet that is loose or worn. Avoid having throw rugs at the top or bottom of the stairs. If you do have throw rugs, attach them to the floor with carpet  tape. Make sure that you have a light switch at the top of the stairs and the bottom of the stairs. If you do not have them, ask someone to add them for you. What else can I do to help prevent falls? Wear shoes that: Do not have high heels. Have rubber bottoms. Are comfortable and fit you well. Are closed at the toe. Do not wear sandals. If you use a stepladder: Make sure that it is fully opened. Do not climb a closed stepladder. Make sure that both sides of the stepladder are locked into place. Ask someone to hold it for you, if possible. Clearly mark and make sure that you can see: Any grab bars or handrails. First and last steps. Where the edge of each step is. Use tools that help you move around (mobility aids) if they are  needed. These include: Canes. Walkers. Scooters. Crutches. Turn on the lights when you go into a dark area. Replace any light bulbs as soon as they burn out. Set up your furniture so you have a clear path. Avoid moving your furniture around. If any of your floors are uneven, fix them. If there are any pets around you, be aware of where they are. Review your medicines with your doctor. Some medicines can make you feel dizzy. This can increase your chance of falling. Ask your doctor what other things that you can do to help prevent falls. This information is not intended to replace advice given to you by your health care provider. Make sure you discuss any questions you have with your health care provider. Document Released: 08/17/2009 Document Revised: 03/28/2016 Document Reviewed: 11/25/2014 Elsevier Interactive Patient Education  2017 [employer].

## 2021-08-09 ENCOUNTER — Telehealth: Payer: Self-pay | Admitting: Pharmacist

## 2021-08-09 NOTE — Chronic Care Management (AMB) (Signed)
    Chronic Care Management Pharmacy Assistant   Name: Richard Fernandez  MRN: [id number] DOB: 07-23-1941   Reason for Encounter: Reschedule visit. Spoke with patient, he is currently working double shifts and unable to come in at this time. He would like a call back in Jan to schedule after the new year.   On patient panel to call patient in Jan.  Care Gaps: Shingix - never done Covid vaccine booster 4 - overdue Flu - due  Star Rating Drugs: Simvastatin 40 mg - last filled 05/17/2021 90DS at [facility] Pharmacist Assistant [phone]

## 2021-08-10 ENCOUNTER — Telehealth: Payer: Medicare Other

## 2021-08-21 ENCOUNTER — Other Ambulatory Visit: Payer: Self-pay | Admitting: Family Medicine

## 2021-10-13 ENCOUNTER — Other Ambulatory Visit: Payer: Self-pay | Admitting: Family Medicine

## 2021-11-07 ENCOUNTER — Other Ambulatory Visit: Payer: Self-pay | Admitting: Family Medicine

## 2021-11-15 ENCOUNTER — Other Ambulatory Visit: Payer: Self-pay | Admitting: Family Medicine

## 2021-11-21 ENCOUNTER — Telehealth: Payer: Self-pay | Admitting: Pharmacist

## 2021-11-21 NOTE — Chronic Care Management (AMB) (Signed)
Chronic Care Management Pharmacy Assistant   Name: Richard Fernandez  MRN: [id number] DOB: 1941/08/30  Reason for Encounter: Disease State / Hypertension Assessment Call   Conditions to be addressed/monitored: HTN   Recent office visits:  None  Recent consult visits:  None  Hospital visits:  None  Medications: Outpatient Encounter Medications as of 11/21/2021  Medication Sig   ARIPiprazole (ABILIFY) 2 MG tablet TAKE 1 TABLET(2 MG) BY MOUTH DAILY   busPIRone (BUSPAR) 7.5 MG tablet Take 1 tablet (7.5 mg total) by mouth 2 (two) times daily.   clopidogrel (PLAVIX) 75 MG tablet TAKE 1 TABLET(75 MG) BY MOUTH DAILY   fish oil-omega-3 fatty acids 1000 MG capsule Take 2 g by mouth 2 (two) times daily.    FLUoxetine (PROZAC) 40 MG capsule TAKE 1 CAPSULE(40 MG) BY MOUTH DAILY   gabapentin (NEURONTIN) 100 MG capsule TAKE 1 CAPSULE(100 MG) BY MOUTH TWICE DAILY   Hypromellose (ARTIFICIAL TEARS OP) Place 1 drop into both eyes daily as needed (dry eyes).   modafinil (PROVIGIL) 100 MG tablet TAKE 1 TABLET BY MOUTH EACH MORNING AS NEEDED   Multiple Vitamin (MULTI VITAMIN MENS PO) Take 1 tablet by mouth daily.    sildenafil (VIAGRA) 50 MG tablet Take 1 tablet (50 mg total) by mouth daily as needed for erectile dysfunction.   simvastatin (ZOCOR) 40 MG tablet TAKE 1 TABLET(40 MG) BY MOUTH DAILY. NEED APPOINTMENT FOR REFILLS.   No facility-administered encounter medications on file as of 11/21/2021.  Fill History: ARIPIPRAZOLE 2MG  TABLETS 09/05/2021 30   FLUOXETINE 40MG  CAPSULES 08/21/2021 90   GABAPENTIN 100MG  CAPSULES 06/28/2021 90   SILDENAFIL 50MG  TABLETS 05/10/2021 30   SIMVASTATIN 40MG  TABLETS 05/17/2021 90   BUSPIRONE 7.5MG  TABLETS 09/05/2021 30    Reviewed chart prior to disease state call. Spoke with patient regarding BP  Recent Office Vitals: BP Readings from Last 3 Encounters:  08/02/21 110/64  05/18/21 130/62  02/28/21 118/60   Pulse Readings from Last 3 Encounters:   08/02/21 (!) 59  05/18/21 69  02/28/21 (!) 56    Wt Readings from Last 3 Encounters:  08/02/21 232 lb (105.2 kg)  05/18/21 234 lb 9.6 oz (106.4 kg)  02/28/21 235 lb 3.2 oz (106.7 kg)     Kidney Function Lab Results  Component Value Date/Time   CREATININE 0.72 02/28/2021 07:53 AM   CREATININE 0.60 (L) 03/11/2020 01:36 PM   GFR 86.66 02/28/2021 07:53 AM   GFRNONAA >60 03/11/2020 01:29 PM   GFRAA >60 03/11/2020 01:29 PM    BMP Latest Ref Rng & Units 02/28/2021 03/11/2020 03/11/2020  Glucose 70 - 99 mg/dL 95 110(H) 112(H)  BUN 6 - 23 mg/dL 15 9 11   Creatinine 0.40 - 1.50 mg/dL 0.72 0.60(L) 0.52(L)  Sodium 135 - 145 mEq/L 138 136 137  Potassium 3.5 - 5.1 mEq/L 4.3 4.1 4.0  Chloride 96 - 112 mEq/L 100 98 101  CO2 19 - 32 mEq/L 28 - 26  Calcium 8.4 - 10.5 mg/dL 8.7 - 8.5(L)  11/21/2021 Name: Richard Fernandez MRN: [id number] DOB: Sep 27, 1941 Richard Fernandez is a 81 y.o. year old male who is a primary care patient of Burchette, Alinda Sierras, MD.  Comprehensive medication review performed; Spoke to patient regarding cholesterol  Lipid Panel    Component Value Date/Time   CHOL 248 (H) 02/28/2021 0753   TRIG 114.0 02/28/2021 0753   HDL 46.60 02/28/2021 0753   LDLCALC 178 (H) 02/28/2021 0753    10-year ASCVD risk score: The ASCVD Risk  score (Arnett DK, et al., 2019) failed to calculate for the following reasons:   The 2019 ASCVD risk score is only valid for ages 81 to 71  Current antihyperlipidemic regimen:  Simvastatin 40 mg daily (patient states he is not taking this)  Previous antihyperlipidemic medications tried: Patient denies taking any other antihyperlipidemic medications  ASCVD risk enhancing conditions: age >78  What recent interventions/DTPs have been made by any provider to improve Cholesterol control since last CPP Visit: No recent interventions  Any recent hospitalizations or ED visits since last visit with CPP? No recent hospital visits.   What diet changes have been made to  improve Cholesterol?  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  Patient states he walks daily  Adherence Review: Does the patient have >5 day gap between last estimated fill dates? Yes   Adherence Review: Is the patient currently on ACE/ARB medication? No Does the patient have >5 day gap between last estimated fill dates? No  Notes: Patient states he has not taken Simvastatin in a long time, he does not recall the reason he stopped the medication. He is no longer taking fish oil and artificial tears.   Care Gaps: AWV - completed 07/26/2021 Last BP - 130/62 on 05/18/2021 Shingix - never done Covid vaccine - overdue Flu - due  Star Rating Drugs: Simvastatin 40 mg - last filled 02/20/2021 90 DS at [facility] verified with [facility] Pharmacist Assistant [phone]

## 2021-11-26 ENCOUNTER — Telehealth: Payer: Self-pay | Admitting: Pharmacist

## 2021-11-26 NOTE — Chronic Care Management (AMB) (Addendum)
° ° °  Chronic Care Management Pharmacy Assistant   Name: Krystle Oberman  MRN: [id number] DOB: [birth year]  Reason for Encounter: Verify amount of statin medication on hand.    Spoke with patient on 11/21/2021 Patient states he has not taken Simvastatin in a long time, he does not recall the reason he stopped the medication.  Spoke with patient, he states he has been taking Simvastatin however, when he looked at the last fill date on the bottle it said 02/2021, he realized that he must have not been taking it for a while but states he has been taking it recently. Patient states he has a few pills left and will call [facility] for the refill today.   Care Gaps: AWV - completed 07/26/2021 Last BP - 130/62 on 05/18/2021 Shingix - never done Covid vaccine - overdue Flu - due  Star Rating Drugs: Simvastatin 40 mg - last filled 02/20/2021 90 DS at [facility] verified with [facility] Pharmacist Assistant [phone]

## 2021-11-30 ENCOUNTER — Encounter: Payer: Self-pay | Admitting: Family Medicine

## 2021-11-30 DIAGNOSIS — H02831 Dermatochalasis of right upper eyelid: Secondary | ICD-10-CM | POA: Diagnosis not present

## 2021-11-30 DIAGNOSIS — H40013 Open angle with borderline findings, low risk, bilateral: Secondary | ICD-10-CM | POA: Diagnosis not present

## 2021-11-30 DIAGNOSIS — H04123 Dry eye syndrome of bilateral lacrimal glands: Secondary | ICD-10-CM | POA: Diagnosis not present

## 2021-11-30 DIAGNOSIS — H25813 Combined forms of age-related cataract, bilateral: Secondary | ICD-10-CM | POA: Diagnosis not present

## 2021-11-30 DIAGNOSIS — H524 Presbyopia: Secondary | ICD-10-CM | POA: Diagnosis not present

## 2021-12-25 ENCOUNTER — Other Ambulatory Visit: Payer: Self-pay | Admitting: *Deleted

## 2021-12-25 MED ORDER — GABAPENTIN 100 MG PO CAPS: ORAL_CAPSULE | ORAL | 0 refills | Status: DC

## 2021-12-25 NOTE — Telephone Encounter (Signed)
Rx done. 

## 2022-01-22 ENCOUNTER — Other Ambulatory Visit: Payer: Self-pay | Admitting: Family Medicine

## 2022-01-30 DIAGNOSIS — D485 Neoplasm of uncertain behavior of skin: Secondary | ICD-10-CM | POA: Diagnosis not present

## 2022-01-30 DIAGNOSIS — L821 Other seborrheic keratosis: Secondary | ICD-10-CM | POA: Diagnosis not present

## 2022-01-30 DIAGNOSIS — Z85828 Personal history of other malignant neoplasm of skin: Secondary | ICD-10-CM | POA: Diagnosis not present

## 2022-01-30 DIAGNOSIS — Z08 Encounter for follow-up examination after completed treatment for malignant neoplasm: Secondary | ICD-10-CM | POA: Diagnosis not present

## 2022-01-30 DIAGNOSIS — L570 Actinic keratosis: Secondary | ICD-10-CM | POA: Diagnosis not present

## 2022-01-30 DIAGNOSIS — R229 Localized swelling, mass and lump, unspecified: Secondary | ICD-10-CM | POA: Diagnosis not present

## 2022-01-30 DIAGNOSIS — C44612 Basal cell carcinoma of skin of right upper limb, including shoulder: Secondary | ICD-10-CM | POA: Diagnosis not present

## 2022-02-26 ENCOUNTER — Telehealth: Payer: Self-pay | Admitting: Pharmacist

## 2022-02-26 NOTE — Chronic Care Management (AMB) (Signed)
? ? ?  Chronic Care Management ?Pharmacy Assistant  ? ?Name: Kery Batzel  MRN: [id number] DOB: 15-Feb-1941 ? ?02/27/2022 APPOINTMENT REMINDER ? ?Laszlo Ellerby was reminded to have all medications, supplements and any blood glucose and blood pressure readings available for review with Jeni Salles, Pharm. D, at his telephone visit on 02/27/2022 at 1:30. ? ?Care Gaps: ?AWV - completed 07/26/2021 ?Last BP - 130/62 on 05/18/2021 ?Shingix - never done ?Covid vaccine - overdue ?  ?Star Rating Drugs: ?Simvastatin 40 mg - last filled 11/27/2021 90 DS at [facility]  ? ? ?Any gaps in medications fill history? 1 day ? ?Gennie Alma CMA  ?Clinical Pharmacist Assistant ?[phone] ? ?

## 2022-02-27 ENCOUNTER — Ambulatory Visit (INDEPENDENT_AMBULATORY_CARE_PROVIDER_SITE_OTHER): Payer: Medicare PPO | Admitting: Pharmacist

## 2022-02-27 DIAGNOSIS — E7849 Other hyperlipidemia: Secondary | ICD-10-CM

## 2022-02-27 DIAGNOSIS — F339 Major depressive disorder, recurrent, unspecified: Secondary | ICD-10-CM

## 2022-02-27 NOTE — Patient Instructions (Addendum)
Hi Richard Fernandez, ? ?It was great to catch up with you again! Keep working on getting in your medications every day as prescribed. I definitely think it's worth trying automatic refills with [facility] to help you keep up with getting in your medications regularly. You may also consider setting alarms to make sure you are getting in your medications regularly as they are not being filled by the pharmacy on a regular basis. ? ?Please reach out to me if you have any questions or need anything! ? ?Best, ?Maddie ? ?Jeni Salles, PharmD, BCACP ?Clinical Pharmacist ?Therapist, music at [facility] ?[phone] ? ? Visit Information ? ? Goals Addressed   ?None ?  ? ?Patient Care Plan: [place]  ?  ? ?Problem Identified: Problem: Hyperlipidemia, Depression, Anxiety, and Bipolar 1 disorder, history of stroke   ?  ? ?Long-Range Goal: Patient-Specific Goal   ?Start Date: 05/10/2021  ?Expected End Date: 05/10/2022  ?Recent Progress: On track  ?Priority: High  ?Note:   ?Current Barriers:  ?Unable to independently monitor therapeutic efficacy ?Unable to achieve control of cholesterol  ?Unable to self administer medications as prescribed ? ?Pharmacist Clinical Goal(s):  ?Patient will achieve adherence to monitoring guidelines and medication adherence to achieve therapeutic efficacy ?achieve control of cholesterol as evidenced by next lipid panel ?achieve ability to self administer medications as prescribed through use of setting alarms as evidenced by patient report through collaboration with PharmD and provider.  ? ?Interventions: ?1:1 collaboration with [name], MD regarding development and update of comprehensive plan of care as evidenced by provider attestation and co-signature ?Inter-disciplinary care team collaboration (see longitudinal plan of care) ?Comprehensive medication review performed; medication list updated in electronic medical record ? ?Hyperlipidemia: (LDL goal < 70) ?-Uncontrolled ?-Current  treatment: ?Simvastatin 40 mg 1 tablet daily at bedtime - Appropriate, Query effective, Safe, Accessible ?-Medications previously tried: fish oil (not needed) ?-Current dietary patterns: eats out more frequently ?-Current exercise habits: nothing consistent ?-Educated on Cholesterol goals;  ?Benefits of statin for ASCVD risk reduction; ?Importance of limiting foods high in cholesterol; ?Exercise goal of 150 minutes per week; ?-Counseled on diet and exercise extensively ?Recommended repeat lipid panel and consider increasing to high intensity statin therapy ? ?History of stroke (Goal: prevent recurrent stroke) ?-Controlled ?-Current treatment  ?Simvastatin 40 mg 1 tablet daily - Appropriate, Effective, Safe, Accessible ?Clopidogrel 75 mg 1 tablet daily -   Appropriate, Effective, Safe, Accessible ?-Medications previously tried: none  ?-Recommended to continue current medication ?Counseled on monitoring for signs of bleeding such as unexplained and excessive bleeding from a cut or injury, easy or excessive bruising, blood in urine or stools, and nosebleeds without a known cause ?Educated on importance of taking the Plavix consistently for stroke prevention ? ? ?Depression/Anxiety (Goal: minimize symptoms) ?-Uncontrolled ?-Current treatment: ?Buspirone 7.5 mg 1 tablet twice daily - Appropriate, Effective, Safe, Accessible ?Fluoxetine 40 mg 1 capsule daily - Appropriate, Query effective, Safe, Accessible ?-Medications previously tried/failed: n/a ?-PHQ9: 0 ?-GAD7: n/a ?-Educated on Benefits of medication for symptom control ?-Recommended to continue current medication ?Collaborated with PCP about restarting buspirone ? ?Bipolar 1 disorder (Goal: minimize symptoms) ?-Controlled ?-Current treatment  ?Aripiprazole 2 mg 1 tablet daily - Appropriate, Effective, Safe, Accessible ?-Medications previously tried: none  ?-Recommended to continue current medication ? ?Daytime sleepiness (Goal: minimize daytime  sleepiness) ?-Uncontrolled ?-Current treatment  ?Modafinil 100 mg 1 tablet in the morning as needed - not taking ?-Medications previously tried: none  ?-Collaborated with PCP for new neurologist referral ? ?  Nerve pain (Goal: minimize pain) ?-Controlled ?-Current treatment  ?Gabapentin 100 mg 1 capsule twice daily - Appropriate, Effective, Safe, Accessible ?-Medications previously tried: none  ?-Recommended to continue current medication ? ? ?Health Maintenance ?-Vaccine gaps: shingrix, COVID booster  ?-Current therapy:  ?Sildenafil 50 mg 1 tablet as needed ?Multivitamin 1 tablet daily ?Artificial tears as needed ?-Educated on Cost vs benefit of each product must be carefully weighed by individual consumer ?-Patient is satisfied with current therapy and denies issues ?-Recommended to continue current medication ? ?Patient Goals/Self-Care Activities ?Patient will:  ?- take medications as prescribed ?focus on medication adherence by setting an alarm as a reminder to take medications ?target a minimum of 150 minutes of moderate intensity exercise weekly ?engage in dietary modifications by limiting fast food ? ?Follow Up Plan: The care management team will reach out to the patient again over the next 30 days.   ? ?  ?  ? ?Patient verbalizes understanding of instructions and care plan provided today and agrees to view in [facility]. Active MyChart status confirmed with patient.   ?The pharmacy team will reach out to the patient again over the next 30 days.  ? ?Viona Gilmore, RPH  ?

## 2022-02-27 NOTE — Progress Notes (Signed)
? ?Chronic Care Management ?Pharmacy Note ? ?02/27/2022 ?Name:  Richard Fernandez MRN:  [id number] DOB:  1941-05-28 ? ?Summary: ?LDL not at goal < 70 ?Pt is not taking medications as prescribed ? ?Recommendations/Changes made from today's visit: ?-Recommend repeat lipid panel and consider increasing to high intensity rosuvastatin 40 mg daily ?-Recommended referral for new neurologist ?-Recommended automatic refills with current pharmacy to improve compliance ? ?Plan: ?CPE in 2 weeks  ?HLD assessment in 2 months ?Follow up in 4 months ? ?Subjective: ?Richard Fernandez is an 81 y.o. year old male who is a primary patient of Burchette, Alinda Sierras, MD.  The CCM team was consulted for assistance with disease management and care coordination needs.   ? ?Engaged with patient by telephone for follow up visit in response to provider referral for pharmacy case management and/or care coordination services.  ? ?Consent to Services:  ?The patient was given information about Chronic Care Management services, agreed to services, and gave verbal consent prior to initiation of services.  Please see initial visit note for detailed documentation.  ? ?Patient Care Team: ?[name], MD as PCP - General (Family Medicine) ?Dohmeier, Asencion Partridge, MD as Consulting Physician (Neurology) ?Hortencia Pilar, MD as Consulting Physician (Ophthalmology) ?Trula Slade, DPM as Consulting Physician (Podiatry) ?Viona Gilmore, [facility] as Pharmacist (Pharmacist) ? ?Recent office visits: ?08/02/21 Richard Pigg, LPN: Patient presented for AWV. ? ?05/18/21 [name], MD: Patient presented for chronic conditions follow up. Recommended taking simvastatin daily and refilled buspirone. ? ?Recent consult visits: ?01/04/21 Dohmeier, Asencion Partridge, MD (Neurology): patient presented for sleep apnea follow-up visit. Patient stopped using his CPAP machine. Recommended to restart in order to get baseline to consider inspire. ? ?Hospital visits: ?None in previous 6  months ? ? ?Objective: ? ?Lab Results  ?Component Value Date  ? CREATININE 0.72 02/28/2021  ? BUN 15 02/28/2021  ? GFR 86.66 02/28/2021  ? GFRNONAA >60 03/11/2020  ? GFRAA >60 03/11/2020  ? NA 138 02/28/2021  ? K 4.3 02/28/2021  ? CALCIUM 8.7 02/28/2021  ? CO2 28 02/28/2021  ? GLUCOSE 95 02/28/2021  ? ? ?Lab Results  ?Component Value Date/Time  ? HGBA1C (H) 05/20/2010 11:44 PM  ?  5.8 ?(NOTE)                                                                       According to the ADA Clinical Practice Recommendations for 2011, when HbA1c is used as a screening test:   >=6.5%   Diagnostic of Diabetes Mellitus           (if abnormal result ? is confirmed)  5.7-6.4%   Increased risk of developing Diabetes Mellitus  References:Diagnosis and Classification of Diabetes Mellitus,Diabetes LKTG,2563,89(HTDSK 1):S62-S69 and Standards of Medical Care in         Diabetes - 2011,Diabetes AJGO,1157,26  ?(Suppl 1):S11-S61.  ? GFR 86.66 02/28/2021 07:53 AM  ? GFR 114.23 08/26/2018 08:58 AM  ?  ?Last diabetic Eye exam:  ?Lab Results  ?Component Value Date/Time  ? HMDIABEYEEXA No Retinopathy 10/06/2017 12:00 AM  ?  ?Last diabetic Foot exam: No results found for: HMDIABFOOTEX  ? ?Lab Results  ?Component Value Date  ? CHOL 248 (H) 02/28/2021  ? HDL 46.60  02/28/2021  ? LDLCALC 178 (H) 02/28/2021  ? TRIG 114.0 02/28/2021  ? CHOLHDL 5 02/28/2021  ? ? ? ?  Latest Ref Rng & Units 02/28/2021  ?  7:53 AM 03/11/2020  ?  1:29 PM 08/26/2018  ?  8:58 AM  ?Hepatic Function  ?Total Protein 6.0 - 8.3 g/dL 6.5   6.8   6.7    ?Albumin 3.5 - 5.2 g/dL 3.9   3.7   4.0    ?AST 0 - 37 U/L _0 ?ALT 0 - 53 U/L _1 ?Alk Phosphatase 39 - 117 U/L 60   47   52    ?Total Bilirubin 0.2 - 1.2 mg/dL 0.5   0.6   0.5    ?Bilirubin, Direct 0.0 - 0.3 mg/dL 0.1    0.1    ? ? ?Lab Results  ?Component Value Date/Time  ? TSH 3.08 02/28/2021 07:53 AM  ? TSH 1.39 01/03/2016 10:40 AM  ? ? ? ?  Latest Ref Rng & Units 02/28/2021  ?  7:53 AM 03/11/2020  ?  1:36 PM  03/11/2020  ?  1:29 PM  ?CBC  ?WBC 4.0 - 10.5 K/uL 3.9    3.6    ?Hemoglobin 13.0 - 17.0 g/dL 14.1   14.3   13.8    ?Hematocrit 39.0 - 52.0 % 41.6   42.0   39.6    ?Platelets 150.0 - 400.0 K/uL 179.0    209    ? ? ?No results found for: VD25OH ? ?Clinical ASCVD: Yes  ?The ASCVD Risk score (Arnett DK, et al., 2019) failed to calculate for the following reasons: ?  The 2019 ASCVD risk score is only valid for ages 62 to 73   ? ? ?  08/02/2021  ?  2:01 PM 12/29/2019  ?  9:13 AM 01/03/2016  ? 10:17 AM  ?Depression screen PHQ 2/9  ?Decreased Interest 0 0 0  ?Down, Depressed, Hopeless 0 0 0  ?PHQ - 2 Score 0 0 0  ?  ? ? ?Social History  ? ?Tobacco Use  ?Smoking Status Former  ? Packs/day: 1.50  ? Years: 20.00  ? Pack years: 30.00  ? Types: Cigarettes  ? Quit date: 11/04/1988  ? Years since quitting: 33.3  ?Smokeless Tobacco Never  ? ?BP Readings from Last 3 Encounters:  ?08/02/21 110/64  ?05/18/21 130/62  ?02/28/21 118/60  ? ?Pulse Readings from Last 3 Encounters:  ?08/02/21 (!) 59  ?05/18/21 69  ?02/28/21 (!) 56  ? ?Wt Readings from Last 3 Encounters:  ?08/02/21 232 lb (105.2 kg)  ?05/18/21 234 lb 9.6 oz (106.4 kg)  ?02/28/21 235 lb 3.2 oz (106.7 kg)  ? ?BMI Readings from Last 3 Encounters:  ?08/02/21 35.28 kg/m?  ?05/18/21 35.67 kg/m?  ?02/28/21 35.76 kg/m?  ? ? ?Assessment/Interventions: Review of patient past medical history, allergies, medications, health status, including review of consultants reports, laboratory and other test data, was performed as part of comprehensive evaluation and provision of chronic care management services.  ? ?SDOH:  (Social Determinants of Health) assessments and interventions performed: No ? ? ?SDOH Screenings  ? ?Alcohol Screen: Low Risk   ? Last Alcohol Screening Score (AUDIT): 0  ?Depression (PHQ2-9): Low Risk   ? PHQ-2 Score: 0  ?Financial Resource Strain: Low Risk   ? Difficulty of Paying Living Expenses: Not hard at all  ?Food Insecurity: No Food Insecurity  ?  Worried About Sales executive in the Last Year: Never true  ? Ran Out of Food in the Last Year: Never true  ?Housing: Not on file  ?Physical Activity: Sufficiently Active  ? Days of Exercise per Week: 5 days  ? Minutes of Exercise per Session: 30 min  ?Social Connections: Moderately Integrated  ? Frequency of Communication with Friends and Family: Three times a week  ? Frequency of Social Gatherings with Friends and Family: Three times a week  ? Attends Religious Services: More than 4 times per year  ? Active Member of Clubs or Organizations: No  ? Attends Archivist Meetings: Never  ? Marital Status: Married  ?Stress: No Stress Concern Present  ? Feeling of Stress : Not at all  ?Tobacco Use: Medium Risk  ? Smoking Tobacco Use: Former  ? Smokeless Tobacco Use: Never  ? Passive Exposure: Not on file  ?Transportation Needs: No Transportation Needs  ? Lack of Transportation (Medical): No  ? Lack of Transportation (Non-Medical): No  ? ? ?CCM Care Plan ? ?No Known Allergies ? ?Medications Reviewed Today   ? ? Reviewed by Viona Gilmore, [place] (Pharmacist) on 02/27/22 at 1345  Med List Status: <None>  ? ?Medication Order Taking? Sig Documenting Provider Last Dose Status Informant  ?ARIPiprazole (ABILIFY) 2 MG tablet [id number] Yes TAKE 1 TABLET BY MOUTH EVERY DAY Burchette, Alinda Sierras, MD Taking Active   ?busPIRone (BUSPAR) 7.5 MG tablet [id number] Yes Take 1 tablet (7.5 mg total) by mouth 2 (two) times daily. [name], MD Taking Active   ?clopidogrel (PLAVIX) 75 MG tablet [id number] Yes TAKE 1 TABLET(75 MG) BY MOUTH DAILY Burchette, Alinda Sierras, MD Taking Active   ?  Discontinued 02/27/22 1344 (Patient Preference) FLUoxetine (PROZAC) 40 MG capsule [id number] Yes TAKE 1 CAPSULE(40 MG) BY MOUTH DAILY Burchette, Alinda Sierras, MD Taking Active   ?gabapentin (NEURONTIN) 100 MG capsule [id number] Yes TAKE 1 CAPSULE(100 MG) BY MOUTH TWICE DAILY Burchette, Alinda Sierras, MD Taking Active   ?Hypromellose (ARTIFICIAL TEARS OP) [id number]  Place 1 drop into  both eyes daily as needed (dry eyes). [provider]  Active Multiple Informants  ?  Discontinued 02/27/22 1344 (Patient Preference)   ?  Discontinued 02/27/22 1344 (Patient Preference) sildenafil

## 2022-03-03 DIAGNOSIS — E7849 Other hyperlipidemia: Secondary | ICD-10-CM | POA: Diagnosis not present

## 2022-03-03 DIAGNOSIS — F339 Major depressive disorder, recurrent, unspecified: Secondary | ICD-10-CM | POA: Diagnosis not present

## 2022-03-09 ENCOUNTER — Other Ambulatory Visit: Payer: Self-pay | Admitting: Family Medicine

## 2022-03-13 ENCOUNTER — Ambulatory Visit (INDEPENDENT_AMBULATORY_CARE_PROVIDER_SITE_OTHER): Payer: Medicare PPO | Admitting: Family Medicine

## 2022-03-13 ENCOUNTER — Encounter: Payer: Self-pay | Admitting: Family Medicine

## 2022-03-13 VITALS — BP 126/80 | HR 70 | Temp 97.3°F | Ht 65.83 in | Wt 222.6 lb

## 2022-03-13 DIAGNOSIS — Z0000 Encounter for general adult medical examination without abnormal findings: Secondary | ICD-10-CM

## 2022-03-13 LAB — BASIC METABOLIC PANEL
BUN: 14 mg/dL (ref 6–23)
CO2: 27 mEq/L (ref 19–32)
Calcium: 8.8 mg/dL (ref 8.4–10.5)
Chloride: 100 mEq/L (ref 96–112)
Creatinine, Ser: 0.73 mg/dL (ref 0.40–1.50)
GFR: 85.67 mL/min (ref >60.00)
Glucose, Bld: 90 mg/dL (ref 70–99)
Potassium: 4 mEq/L (ref 3.5–5.1)
Sodium: 135 mEq/L (ref 135–145)

## 2022-03-13 LAB — CBC WITH DIFFERENTIAL/PLATELET
Basophils Absolute: 0 10*3/uL (ref 0.0–0.1)
Basophils Relative: 1 % (ref 0.0–3.0)
Eosinophils Absolute: 0 10*3/uL (ref 0.0–0.7)
Eosinophils Relative: 1.3 % (ref 0.0–5.0)
HCT: 40.6 % (ref 39.0–52.0)
Hemoglobin: 13.9 g/dL (ref 13.0–17.0)
Lymphocytes Relative: 27.9 % (ref 12.0–46.0)
Lymphs Abs: 1 10*3/uL (ref 0.7–4.0)
MCHC: 34.2 g/dL (ref 30.0–36.0)
MCV: 100.9 fl — ABNORMAL HIGH (ref 78.0–100.0)
Monocytes Absolute: 0.3 10*3/uL (ref 0.1–1.0)
Monocytes Relative: 9.1 % (ref 3.0–12.0)
Neutro Abs: 2.1 10*3/uL (ref 1.4–7.7)
Neutrophils Relative %: 60.7 % (ref 43.0–77.0)
Platelets: 180 10*3/uL (ref 150.0–400.0)
RBC: 4.02 Mil/uL — ABNORMAL LOW (ref 4.22–5.81)
RDW: 13.8 % (ref 11.5–15.5)
WBC: 3.5 10*3/uL — ABNORMAL LOW (ref 4.0–10.5)

## 2022-03-13 LAB — HEPATIC FUNCTION PANEL
ALT: 16 U/L (ref 0–53)
AST: 15 U/L (ref 0–37)
Albumin: 4 g/dL (ref 3.5–5.2)
Alkaline Phosphatase: 63 U/L (ref 39–117)
Bilirubin, Direct: 0.1 mg/dL (ref 0.0–0.3)
Total Bilirubin: 0.5 mg/dL (ref 0.2–1.2)
Total Protein: 7 g/dL (ref 6.0–8.3)

## 2022-03-13 LAB — LIPID PANEL
Cholesterol: 176 mg/dL (ref 0–200)
HDL: 49.4 mg/dL (ref >39.00)
LDL Cholesterol: 109 mg/dL — ABNORMAL HIGH (ref 0–99)
NonHDL: 126.88
Total CHOL/HDL Ratio: 4
Triglycerides: 91 mg/dL (ref 0.0–149.0)
VLDL: 18.2 mg/dL (ref 0.0–40.0)

## 2022-03-13 LAB — TSH: TSH: 3.12 u[IU]/mL (ref 0.35–5.50)

## 2022-03-13 NOTE — Progress Notes (Signed)
? ?Established Patient Office Visit ? ?Subjective   ?Patient ID: Richard Fernandez, male    DOB: 05/08/1941  Age: 81 y.o. MRN: [id number] ? ?Chief Complaint  ?Patient presents with  ? Annual Exam  ? ? ?HPI ? ? ?Seen today for wellness visit/physical exam.  Past medical history is significant for obstructive sleep apnea, squamous cell carcinoma of the skin, posttraumatic stress disorder, recurrent depression, hyperlipidemia, obesity, history of CVA.  He feels like his depression and anxiety symptoms are currently stable with combination of fluoxetine, Abilify, and BuSpar. ? ?He is currently retired.  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  Denies any recent falls but does sometimes feel slightly off balance. ? ?He does struggle some with memory and he and his wife use pillbox which they set out every Sunday to help them remember medications. ? ?He has lost some weight and he states this is due to his efforts with restricting overall calories.  Still has good appetite. ? ?Health maintenance reviewed ? ?-Pneumonia vaccines complete ?-Tetanus due 2030 ?-Has had previous Zostavax.  He declines Shingrix. ?-Aged out of colonoscopy ?-PSA not recommended given his age ? ?Social history-he is married.  This is second marriage.  His first wife died of cancer about 101 years ago.  No children.  Quit smoking around 1990.  About 30-pack-year history.  Several years ago used to drink about 6 beers per day but he quit this many years ago.  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 ? ?Family history-unknown as he was adopted. ? ?Past Medical History:  ?Diagnosis Date  ? CEREBROVASCULAR ACCIDENT, HX OF 11/29/2010  ? DEPRESSION 11/29/2010  ? Hearing loss   ? HYPERLIPIDEMIA 11/29/2010  ? OBSTRUCTIVE SLEEP APNEA 11/29/2010  ? CPAP  ? PTSD  (post-traumatic stress disorder)   ? Stroke [facility])   ? ?Past Surgical History:  ?Procedure Laterality Date  ? [place]  ? HAND SURGERY    ? KNEE SURGERY    ? SHOULDER ARTHROSCOPY WITH ROTATOR CUFF REPAIR    ? ? reports that he quit smoking about 33 years ago. His smoking use included cigarettes. He has a 30.00 pack-year smoking history. He has never used smokeless tobacco. He reports that he does not drink alcohol and does not use drugs. ?family history includes Breast cancer in his mother; Heart failure in his father. He was adopted. ?No Known Allergies ? ?Review of Systems  ?Constitutional:  Negative for chills, fever and weight loss.  ?Respiratory:  Negative for cough and shortness of breath.   ?Cardiovascular:  Negative for chest pain.  ?Gastrointestinal:  Negative for abdominal pain.  ?Genitourinary:  Negative for dysuria.  ?Skin:  Negative for rash.  ?Neurological:  Negative for dizziness and headaches.  ?Psychiatric/Behavioral:  Negative for depression.   ? ?  ?Objective:  ?  ? ?BP 126/80 (BP Location: Left Arm, Patient Position: Sitting, Cuff Size: Normal)   Pulse 70   Temp (!) 97.3 ?F (36.3 ?C) (Oral)   Ht 5' 5.83" (1.672 m)   Wt 222 lb 9.6 oz (101 kg)   SpO2 94%   BMI 36.12 kg/m?  ?BP Readings from Last 3 Encounters:  ?03/13/22 126/80  ?08/02/21 110/64  ?05/18/21 130/62  ? ?Wt Readings from Last 3 Encounters:  ?03/13/22 222  lb 9.6 oz (101 kg)  ?08/02/21 232 lb (105.2 kg)  ?05/18/21 234 lb 9.6 oz (106.4 kg)  ? ?  ? ?Physical Exam ?Vitals reviewed.  ?Constitutional:   ?   Appearance: Normal appearance.  ?HENT:  ?   Head: Normocephalic and atraumatic.  ?   Mouth/Throat:  ?   Mouth: Mucous membranes are moist.  ?   Pharynx: Oropharynx is clear. No oropharyngeal exudate or posterior oropharyngeal erythema.  ?Eyes:  ?   Pupils: Pupils are equal, round, and reactive to light.  ?Cardiovascular:  ?   Rate and Rhythm: Normal rate and regular rhythm.  ?Pulmonary:  ?   Effort: Pulmonary effort is  normal.  ?   Breath sounds: Normal breath sounds.  ?Abdominal:  ?   Palpations: Abdomen is soft. There is no mass.  ?   Tenderness: There is no abdominal tenderness. There is no guarding or rebound.  ?Musculoskeletal:  ?   Right lower leg: No edema.  ?   Left lower leg: No edema.  ?Skin: ?   Findings: No rash.  ?Neurological:  ?   General: No focal deficit present.  ?   Mental Status: He is alert.  ?   Cranial Nerves: No cranial nerve deficit.  ? ? ? ?No results found for any visits on 03/13/22. ? ? ? ?The ASCVD Risk score (Arnett DK, et al., 2019) failed to calculate for the following reasons: ?  The 2019 ASCVD risk score is only valid for ages 63 to 48 ? ?  ?Assessment & Plan:  ? ?Problem List Items Addressed This Visit   ?None ?Visit Diagnoses   ? ? Physical exam    -  Primary  ? Relevant Orders  ? Basic metabolic panel  ? Lipid panel  ? CBC with Differential/Platelet  ? TSH  ? Hepatic function panel  ? ?  ?Patient has chronic problems as above.  Generally stable at this time.  He declines Shingrix vaccine.  We strongly encouraged to continue annual flu vaccine.  Obtain screening labs as above.  We discussed fall prevention.  Offered physical therapy for balance training but he declines at this time.  He will consider for future.  If lipids not further to goal at this time consider change to more potent statin. ? ?No follow-ups on file.  ? ? ?Carolann Littler, MD ? ?

## 2022-03-14 ENCOUNTER — Other Ambulatory Visit: Payer: Self-pay

## 2022-03-14 DIAGNOSIS — E7849 Other hyperlipidemia: Secondary | ICD-10-CM

## 2022-03-14 MED ORDER — ROSUVASTATIN CALCIUM 40 MG PO TABS: 40.0000 mg | ORAL_TABLET | Freq: Every day | ORAL | 1 refills | Status: DC

## 2022-03-20 DIAGNOSIS — C44612 Basal cell carcinoma of skin of right upper limb, including shoulder: Secondary | ICD-10-CM | POA: Diagnosis not present

## 2022-04-02 ENCOUNTER — Other Ambulatory Visit: Payer: Self-pay | Admitting: Family Medicine

## 2022-04-04 DIAGNOSIS — M1712 Unilateral primary osteoarthritis, left knee: Secondary | ICD-10-CM | POA: Diagnosis not present

## 2022-04-04 DIAGNOSIS — M25462 Effusion, left knee: Secondary | ICD-10-CM | POA: Diagnosis not present

## 2022-04-17 ENCOUNTER — Telehealth: Payer: Self-pay | Admitting: Pharmacist

## 2022-04-17 NOTE — Chronic Care Management (AMB) (Cosign Needed)
    Chronic Care Management Pharmacy Assistant   Name: Richard Fernandez  MRN: [id number] DOB: 04/10/1941  Reason for Encounter: Disease State / Hyperlipidemia Assessment Call   Conditions to be addressed/monitored: HLD  Recent office visits:  03/13/2022 Carolann Littler MD - Patient was seen for Physical exam. No medication changes. No follow up noted.   Recent consult visits:  None  Hospital visits:  None  Medications: Outpatient Encounter Medications as of 04/17/2022  Medication Sig   ARIPiprazole (ABILIFY) 2 MG tablet TAKE 1 TABLET BY MOUTH EVERY DAY   busPIRone (BUSPAR) 7.5 MG tablet TAKE 1 TABLET(7.5 MG) BY MOUTH TWICE DAILY   clopidogrel (PLAVIX) 75 MG tablet TAKE 1 TABLET(75 MG) BY MOUTH DAILY   FLUoxetine (PROZAC) 40 MG capsule TAKE 1 CAPSULE(40 MG) BY MOUTH DAILY   gabapentin (NEURONTIN) 100 MG capsule TAKE 1 CAPSULE(100 MG) BY MOUTH TWICE DAILY   Hypromellose (ARTIFICIAL TEARS OP) Place 1 drop into both eyes daily as needed (dry eyes).   rosuvastatin (CRESTOR) 40 MG tablet Take 1 tablet (40 mg total) by mouth daily.   sildenafil (VIAGRA) 50 MG tablet Take 1 tablet (50 mg total) by mouth daily as needed for erectile dysfunction.   No facility-administered encounter medications on file as of 04/17/2022.  Fill History: ARIPIPRAZOLE '2MG'$  TABLETS 02/28/2022 30   BUSPIRONE 7.'5MG'$  TABLETS 02/28/2022 30   CLOPIDOGREL '75MG'$  TABLETS 03/02/2022 90   FLUOXETINE '40MG'$  CAPSULES 08/21/2021 90   GABAPENTIN '100MG'$  CAPSULES 01/06/2022 90   ROSUVASTATIN '40MG'$  TABLETS 03/14/2022 90   SILDENAFIL '50MG'$  TABLETS 05/10/2021 30   04/17/2022 Name: Richard Fernandez MRN: [id number] DOB: [birth year] Richard Fernandez is a 81 y.o. year old male who is a primary care patient of [name], MD.  Comprehensive medication review performed; Spoke to patient regarding cholesterol  Lipid Panel    Component Value Date/Time   CHOL 176 03/13/2022 0926   TRIG 91.0 03/13/2022 0926   HDL 49.40 03/13/2022 0926    LDLCALC 109 (H) 03/13/2022 0926    10-year ASCVD risk score: The ASCVD Risk score (Arnett DK, et al., 2019) failed to calculate for the following reasons:   The 2019 ASCVD risk score is only valid for ages 48 to 55  Current antihyperlipidemic regimen:  Rosuvastatin 40 mg daily  Previous antihyperlipidemic medications tried: Simvastatin  ASCVD risk enhancing conditions: age >32  What recent interventions/DTPs have been made by any provider to improve Cholesterol control since last CPP Visit: No recent interventions.  Any recent hospitalizations or ED visits since last visit with CPP? No recent hospital visits.  What diet changes have been made to improve Cholesterol?  Patient follows no specific way of eating Breakfast - patient generally does not eat breakfast Lunch - patient will have a sandwich of some sort Dinner - patient will have a meal containing a meat and two vegetables  What exercise is being done to improve Cholesterol?  Patient states he walks daily  Adherence Review: Does the patient have >5 day gap between last estimated fill dates? No  Care Gaps: AWV -completed 08/02/2021 Last BP - 126/80 on 03/13/2022 Covid booster - overdue Shingrix - postponed  Star Rating Drugs: Rosuvastatin 40 mg - last filled 03/14/2022 90 DS at [place] Pharmacist Assistant [phone]

## 2022-04-18 ENCOUNTER — Other Ambulatory Visit: Payer: Self-pay | Admitting: Family Medicine

## 2022-04-18 DIAGNOSIS — E785 Hyperlipidemia, unspecified: Secondary | ICD-10-CM

## 2022-04-24 DIAGNOSIS — M1712 Unilateral primary osteoarthritis, left knee: Secondary | ICD-10-CM | POA: Diagnosis not present

## 2022-05-15 ENCOUNTER — Other Ambulatory Visit: Payer: Self-pay | Admitting: Family Medicine

## 2022-05-21 ENCOUNTER — Other Ambulatory Visit: Payer: Medicare PPO

## 2022-05-22 DIAGNOSIS — M1712 Unilateral primary osteoarthritis, left knee: Secondary | ICD-10-CM | POA: Diagnosis not present

## 2022-05-23 ENCOUNTER — Other Ambulatory Visit: Payer: Self-pay | Admitting: Orthopedic Surgery

## 2022-05-23 DIAGNOSIS — T148XXA Other injury of unspecified body region, initial encounter: Secondary | ICD-10-CM

## 2022-05-23 DIAGNOSIS — S83289A Other tear of lateral meniscus, current injury, unspecified knee, initial encounter: Secondary | ICD-10-CM

## 2022-05-30 ENCOUNTER — Ambulatory Visit
Admission: RE | Admit: 2022-05-30 | Discharge: 2022-05-30 | Disposition: A | Discharge status: Home / Self Care | Payer: Medicare PPO | Source: Ambulatory Visit | Attending: Orthopedic Surgery | Admitting: Orthopedic Surgery

## 2022-05-30 DIAGNOSIS — M7989 Other specified soft tissue disorders: Secondary | ICD-10-CM | POA: Diagnosis not present

## 2022-05-30 DIAGNOSIS — S83289A Other tear of lateral meniscus, current injury, unspecified knee, initial encounter: Secondary | ICD-10-CM

## 2022-05-30 DIAGNOSIS — T148XXA Other injury of unspecified body region, initial encounter: Secondary | ICD-10-CM

## 2022-06-04 ENCOUNTER — Other Ambulatory Visit: Payer: Self-pay | Admitting: Orthopedic Surgery

## 2022-06-04 DIAGNOSIS — S83282A Other tear of lateral meniscus, current injury, left knee, initial encounter: Secondary | ICD-10-CM

## 2022-06-07 ENCOUNTER — Ambulatory Visit
Admission: RE | Admit: 2022-06-07 | Discharge: 2022-06-07 | Disposition: A | Discharge status: Home / Self Care | Payer: Medicare PPO | Source: Ambulatory Visit | Attending: Orthopedic Surgery | Admitting: Orthopedic Surgery

## 2022-06-07 DIAGNOSIS — M7989 Other specified soft tissue disorders: Secondary | ICD-10-CM | POA: Diagnosis not present

## 2022-06-07 DIAGNOSIS — M25562 Pain in left knee: Secondary | ICD-10-CM | POA: Diagnosis not present

## 2022-06-07 DIAGNOSIS — S83282A Other tear of lateral meniscus, current injury, left knee, initial encounter: Secondary | ICD-10-CM

## 2022-06-07 MED ORDER — IOPAMIDOL (ISOVUE-M 200) INJECTION 41%
40.0000 mL | Freq: Once | INTRAMUSCULAR | Status: AC
  Administered 2022-06-07: 40 mL via INTRA_ARTICULAR

## 2022-06-12 DIAGNOSIS — M1712 Unilateral primary osteoarthritis, left knee: Secondary | ICD-10-CM | POA: Diagnosis not present

## 2022-06-18 DIAGNOSIS — M1712 Unilateral primary osteoarthritis, left knee: Secondary | ICD-10-CM | POA: Diagnosis not present

## 2022-06-20 DIAGNOSIS — M1712 Unilateral primary osteoarthritis, left knee: Secondary | ICD-10-CM | POA: Diagnosis not present

## 2022-06-27 DIAGNOSIS — M1712 Unilateral primary osteoarthritis, left knee: Secondary | ICD-10-CM | POA: Diagnosis not present

## 2022-07-04 DIAGNOSIS — M1712 Unilateral primary osteoarthritis, left knee: Secondary | ICD-10-CM | POA: Diagnosis not present

## 2022-07-11 DIAGNOSIS — M1712 Unilateral primary osteoarthritis, left knee: Secondary | ICD-10-CM | POA: Diagnosis not present

## 2022-07-15 ENCOUNTER — Telehealth: Payer: Medicare PPO

## 2022-07-16 ENCOUNTER — Other Ambulatory Visit: Payer: Self-pay | Admitting: Family Medicine

## 2022-07-17 ENCOUNTER — Telehealth: Payer: Self-pay | Admitting: Pharmacist

## 2022-07-17 NOTE — Chronic Care Management (AMB) (Signed)
    Chronic Care Management Pharmacy Assistant   Name: Clarion Mooneyhan  MRN: [id number] DOB: Oct 19, 1941  Reason for Encounter: Disease State / General Assessment Call   Conditions to be addressed/monitored: HLD and Depression  Recent office visits:  03/13/2022 Carolann Littler MD - Patient was seen for a physical exam. No medication changes. No follow up noted.   Recent consult visits:  None  Hospital visits:  None  Medications: Outpatient Encounter Medications as of 07/17/2022  Medication Sig   ARIPiprazole (ABILIFY) 2 MG tablet TAKE 1 TABLET BY MOUTH EVERY DAY   busPIRone (BUSPAR) 7.5 MG tablet TAKE 1 TABLET(7.5 MG) BY MOUTH TWICE DAILY   clopidogrel (PLAVIX) 75 MG tablet TAKE 1 TABLET(75 MG) BY MOUTH DAILY   FLUoxetine (PROZAC) 40 MG capsule TAKE 1 CAPSULE(40 MG) BY MOUTH DAILY   gabapentin (NEURONTIN) 100 MG capsule TAKE 1 CAPSULE(100 MG) BY MOUTH TWICE DAILY   Hypromellose (ARTIFICIAL TEARS OP) Place 1 drop into both eyes daily as needed (dry eyes).   rosuvastatin (CRESTOR) 40 MG tablet Take 1 tablet (40 mg total) by mouth daily.   sildenafil (VIAGRA) 50 MG tablet Take 1 tablet (50 mg total) by mouth daily as needed for erectile dysfunction.   No facility-administered encounter medications on file as of 07/17/2022.  Fill History:  SILDENAFIL '50MG'$  TABLETS 05/10/2021 30   ROSUVASTATIN '40MG'$  TABLETS 06/30/2022 90   GABAPENTIN '100MG'$  CAPSULES 04/03/2022 90   fluoxetine 40 mg capsule 07/14/2022 90   CLOPIDOGREL '75MG'$  TABLETS 06/30/2022 90   BUSPIRONE 7.'5MG'$  TABLETS 06/05/2022 30   ARIPIPRAZOLE '2MG'$  TABLETS 06/30/2022 [place] [facility] Review Call  Chart Review: Have there been any documented new, changed, or discontinued medications since last visit? No medication changes.  Has there been any documented recent hospitalizations or ED visits since last visit with Clinical Pharmacist? No recent hospital visits.   Note: Patient declined to do an assessment  call, states his medications are all fine and he not having any issues.   Care Gaps: AWV - 07/17/2022 message sent to Ramond Craver Last BP - 126/80 on 03/13/2022 Shingrix - never done Covid booster - overdue Flu - due  Star Rating Drugs: Rosuvastatin 40 mg - last filled  06/30/2022 90 DS as [place] Pharmacist Assistant [phone]

## 2022-07-24 DIAGNOSIS — M1712 Unilateral primary osteoarthritis, left knee: Secondary | ICD-10-CM | POA: Diagnosis not present

## 2022-07-25 ENCOUNTER — Telehealth: Payer: Self-pay | Admitting: Family Medicine

## 2022-07-25 NOTE — Telephone Encounter (Signed)
Left message for patient to call back and schedule Medicare Annual Wellness Visit (AWV) either virtually or in office. Left  my jabber number [phone]   Last AWV ;08/02/21  please schedule at anytime with [facility] Nurse Health Advisor 1 or 2   

## 2022-08-05 ENCOUNTER — Telehealth: Payer: Self-pay

## 2022-08-05 ENCOUNTER — Telehealth: Payer: Self-pay | Admitting: Family Medicine

## 2022-08-05 NOTE — Telephone Encounter (Signed)
Contacted patient on preferred number listed in notes for scheduled 1 pm AWV. Patient stated unable to complete visit at this time to call back in 10 mins. Called patient back, no answer. Patient returned call to office at 1:42 pm and was told he could rescheduled. Patient refused and stated he will only deal with PCP.

## 2022-08-05 NOTE — Telephone Encounter (Signed)
Pt is calling and would like to know if he could do his medicare annual wellness with his md instead of with wellness coach

## 2022-08-09 NOTE — Telephone Encounter (Signed)
Lmom for pt to call back. 

## 2022-08-12 ENCOUNTER — Ambulatory Visit (INDEPENDENT_AMBULATORY_CARE_PROVIDER_SITE_OTHER): Payer: Medicare PPO | Admitting: Family Medicine

## 2022-08-12 ENCOUNTER — Encounter: Payer: Self-pay | Admitting: Family Medicine

## 2022-08-12 VITALS — BP 110/66 | HR 65 | Temp 97.5°F | Ht 66.54 in | Wt 215.5 lb

## 2022-08-12 DIAGNOSIS — Z23 Encounter for immunization: Secondary | ICD-10-CM | POA: Diagnosis not present

## 2022-08-12 DIAGNOSIS — Z8673 Personal history of transient ischemic attack (TIA), and cerebral infarction without residual deficits: Secondary | ICD-10-CM | POA: Diagnosis not present

## 2022-08-12 DIAGNOSIS — E7849 Other hyperlipidemia: Secondary | ICD-10-CM

## 2022-08-12 DIAGNOSIS — R229 Localized swelling, mass and lump, unspecified: Secondary | ICD-10-CM

## 2022-08-12 NOTE — Progress Notes (Signed)
Established Patient Office Visit  Subjective   Patient ID: Richard Fernandez, male    DOB: 02-Nov-1941  Age: 81 y.o. MRN: [id number]  No chief complaint on file.   HPI   Richard Fernandez is here for medical follow-up.  He was initially scheduled for Medicare wellness visit.  However, he had physical in May and declines Medicare wellness at this time.  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    Current medications include Abilify, BuSpar, Plavix, Prozac, gabapentin, rosuvastatin.  We had recently gotten something from his insurance company stating that his compliance may not be good with the statin.  This was based on refill data.  Patient states though that he is taking this regularly.    He has nodule dorsum right hand which he noticed few months ago.  Nonpainful.  Slowly growing in size.  Past history of squamous cell skin cancer as above.  He denies any recent chest pains.  Good appetite.  No fevers or chills.  No dysuria.  No change in bowel habits.  Has already had flu vaccine  Past Medical History:  Diagnosis Date   CEREBROVASCULAR ACCIDENT, HX OF 11/29/2010   DEPRESSION 11/29/2010   Hearing loss    HYPERLIPIDEMIA 11/29/2010   OBSTRUCTIVE SLEEP APNEA 11/29/2010   CPAP   PTSD (post-traumatic stress disorder)    Stroke [facility])    Past Surgical History:  Procedure Laterality Date   BACK SURGERY  1962   HAND SURGERY     KNEE SURGERY     SHOULDER ARTHROSCOPY WITH ROTATOR CUFF REPAIR      reports that he quit smoking about 33 years ago. His smoking use included cigarettes. He has a 30.00 pack-year smoking history. He has never used smokeless tobacco. He reports that he does not drink alcohol and does not use drugs. family history includes Breast cancer in his mother; Heart failure in his  father. He was adopted. No Known Allergies  Review of Systems  Constitutional:  Negative for malaise/fatigue.  Eyes:  Negative for blurred vision.  Respiratory:  Negative for shortness of breath.   Cardiovascular:  Negative for chest pain.  Neurological:  Negative for dizziness, weakness and headaches.      Objective:     BP 110/66 (BP Location: Left Arm, Patient Position: Sitting, Cuff Size: Normal)   Pulse 65   Temp (!) 97.5 F (36.4 C) (Oral)   Ht 5' 6.54" (1.69 m)   Wt 215 lb 8 oz (97.8 kg)   SpO2 96%   BMI 34.23 kg/m  BP Readings from Last 3 Encounters:  08/12/22 110/66  03/13/22 126/80  08/02/21 110/64   Wt Readings from Last 3 Encounters:  08/12/22 215 lb 8 oz (97.8 kg)  03/13/22 222 lb 9.6 oz (101 kg)  08/02/21 232 lb (105.2 kg)      Physical Exam Constitutional:      Appearance: He is well-developed.  HENT:     Right Ear: External ear normal.     Left Ear: External ear normal.  Eyes:     Pupils: Pupils are equal, round, and reactive to light.  Neck:     Thyroid: No thyromegaly.  Cardiovascular:     Rate and Rhythm: Normal rate and regular rhythm.  Pulmonary:     Effort: Pulmonary effort is normal. No respiratory distress.  Breath sounds: Normal breath sounds. No wheezing or rales.  Musculoskeletal:     Cervical back: Neck supple.  Skin:    Comments: Hand reveals nodule which is about 9 to 10 mm diameter.  Neurological:     Mental Status: He is alert and oriented to person, place, and time.      No results found for any visits on 08/12/22.    The ASCVD Risk score (Arnett DK, et al., 2019) failed to calculate for the following reasons:   The 2019 ASCVD risk score is only valid for ages 57 to 4    Assessment & Plan:   #1 skin nodule dorsum right hand.  Suspect squamous cell carcinoma.  Set up referral to skin surgery center.  #2 hyperlipidemia.  Patient on Crestor 40 mg daily.  Insurance recently sent data questioning compliance.   Patient states he takes this regularly.  He declines further lipids today but will recheck at 67-monthfollow-up.  We did mention that we like to see tighter LDL control with his past history of stroke.  #3 past history of CVA.  His blood pressure is well controlled.  No history of diabetes.  Patient remains on Plavix.  #4 questionable compliance with medications.  Refills have been inconsistent.  There is some question of cognitive decline.  We did suggest pillbox.  Unfortunately his spouse also appears to have some early cognitive decline  Return in about 6 months (around 02/11/2023).    BCarolann Littler MD

## 2022-08-12 NOTE — Patient Instructions (Addendum)
Let me know if you have not heard from dermatologist in next couple of weeks  Consider Shingrix vaccine at some point this year.    Set up 6 month follow up.

## 2022-08-19 ENCOUNTER — Telehealth: Payer: Self-pay | Admitting: Family Medicine

## 2022-08-19 NOTE — Telephone Encounter (Signed)
Pt called to confirm that MD faxed over his referral, because the skin surgery center is saying they have yet to receive anything.  Please advise.

## 2022-08-21 NOTE — Telephone Encounter (Signed)
Patient informed that referral was resent

## 2022-08-21 NOTE — Telephone Encounter (Signed)
Sent on 10/09 and resent on 10/18.

## 2022-08-26 NOTE — Telephone Encounter (Signed)
Resent twice today. Will call to get update in hour.

## 2022-08-26 NOTE — Telephone Encounter (Signed)
I contacted Richard Fernandez with [facility] and she requested referral coordinator send referral manually. Referral coordinator was informed of the via Teams.

## 2022-08-26 NOTE — Telephone Encounter (Signed)
[facility] called to say they still have not received the referral and are asking if you could please send it again?  Fax [phone]

## 2022-09-16 ENCOUNTER — Telehealth: Payer: Self-pay | Admitting: Pharmacist

## 2022-09-16 NOTE — Chronic Care Management (AMB) (Signed)
    Chronic Care Management Pharmacy Assistant   Name: Ainsley Sanguinetti  MRN: [id number] DOB: 1941-03-21  09/17/2022 APPOINTMENT REMINDER  [name] was reminded to have all medications, supplements and any blood glucose and blood pressure readings available for review with Jeni Salles, Pharm. D, at his telephone visit on 09/17/2022 at 8:30.  Care Gaps: AWV - 07/17/2022 message sent to Ramond Craver Last BP - 110/66 on 08/12/2022 Shingrix - never done Covid - overdue AWV - overdue  Star Rating Drug: Rosuvastatin 40 mg - last filled  06/30/2022 90 DS as [facility]   Any gaps in medications fill history? No  Gennie Alma [facility]  Catering manager [phone]

## 2022-09-16 NOTE — Progress Notes (Unsigned)
Chronic Care Management Pharmacy Note  09/17/2022 Name:  Richard Fernandez MRN:  [id number] DOB:  [birth year]  Summary: LDL not at goal < 70 Pt reports adherence is improved  Recommendations/Changes made from today's visit: -Recommended repeat lipid panel and scheduled lab visit -Recommended shingrix and updated vaccine history  Plan: Review lipid panel results Follow up assessment in 4-5 months  Subjective: Richard Fernandez is an 81 y.o. year old male who is a primary patient of Burchette, Alinda Sierras, MD.  The CCM team was consulted for assistance with disease management and care coordination needs.    Engaged with patient by telephone for follow up visit in response to provider referral for pharmacy case management and/or care coordination services.   Consent to Services:  The patient was given information about Chronic Care Management services, agreed to services, and gave verbal consent prior to initiation of services.  Please see initial visit note for detailed documentation.   Patient Care Team: [name], MD as PCP - General (Family Medicine) Dohmeier, Asencion Partridge, MD as Consulting Physician (Neurology) Hortencia Pilar, MD as Consulting Physician (Ophthalmology) Trula Slade, DPM as Consulting Physician (Podiatry) Viona Gilmore, [facility] as Pharmacist (Pharmacist)  Recent office visits: 08/12/22 [name], MD: Patient presented for chronic conditions follow up. Follow up in 6 months.  Recent consult visits: 01/04/21 Dohmeier, Asencion Partridge, MD (Neurology): patient presented for sleep apnea follow-up visit. Patient stopped using his CPAP machine. Recommended to restart in order to get baseline to consider inspire.  Hospital visits: None in previous 6 months   Objective:  Lab Results  Component Value Date   CREATININE 0.73 03/13/2022   BUN 14 03/13/2022   GFR 85.67 03/13/2022   GFRNONAA >60 03/11/2020   GFRAA >60 03/11/2020   NA 135 03/13/2022   K 4.0  03/13/2022   CALCIUM 8.8 03/13/2022   CO2 27 03/13/2022   GLUCOSE 90 03/13/2022    Lab Results  Component Value Date/Time   HGBA1C (H) 05/20/2010 11:44 PM    5.8 (NOTE)                                                                       According to the ADA Clinical Practice Recommendations for 2011, when HbA1c is used as a screening test:   >=6.5%   Diagnostic of Diabetes Mellitus           (if abnormal result  is confirmed)  5.7-6.4%   Increased risk of developing Diabetes Mellitus  References:Diagnosis and Classification of Diabetes Mellitus,Diabetes YKZL,9357,01(XBLTJ 1):S62-S69 and Standards of Medical Care in         Diabetes - 2011,Diabetes Care,2011,34  (Suppl 1):S11-S61.   GFR 85.67 03/13/2022 09:26 AM   GFR 86.66 02/28/2021 07:53 AM    Last diabetic Eye exam:  Lab Results  Component Value Date/Time   HMDIABEYEEXA No Retinopathy 10/06/2017 12:00 AM    Last diabetic Foot exam: No results found for: "HMDIABFOOTEX"   Lab Results  Component Value Date   CHOL 176 03/13/2022   HDL 49.40 03/13/2022   LDLCALC 109 (H) 03/13/2022   TRIG 91.0 03/13/2022   CHOLHDL 4 03/13/2022       Latest Ref Rng & Units 03/13/2022    9:26  AM 02/28/2021    7:53 AM 03/11/2020    1:29 PM  Hepatic Function  Total Protein 6.0 - 8.3 g/dL 7.0  6.5  6.8   Albumin 3.5 - 5.2 g/dL 4.0  3.9  3.7   AST 0 - 37 U/L _0 ALT 0 - 53 U/L _1 Alk Phosphatase 39 - 117 U/L 63  60  47   Total Bilirubin 0.2 - 1.2 mg/dL 0.5  0.5  0.6   Bilirubin, Direct 0.0 - 0.3 mg/dL 0.1  0.1      Lab Results  Component Value Date/Time   TSH 3.12 03/13/2022 09:26 AM   TSH 3.08 02/28/2021 07:53 AM       Latest Ref Rng & Units 03/13/2022    9:26 AM 02/28/2021    7:53 AM 03/11/2020    1:36 PM  CBC  WBC 4.0 - 10.5 K/uL 3.5  3.9    Hemoglobin 13.0 - 17.0 g/dL 13.9  14.1  14.3   Hematocrit 39.0 - 52.0 % 40.6  41.6  42.0   Platelets 150.0 - 400.0 K/uL 180.0  179.0      No results found for:  "VD25OH"  Clinical ASCVD: Yes  The ASCVD Risk score (Arnett DK, et al., 2019) failed to calculate for the following reasons:   The 2019 ASCVD risk score is only valid for ages 40 to 38       08/12/2022    1:35 PM 03/13/2022    9:28 AM 08/02/2021    2:01 PM  Depression screen PHQ 2/9  Decreased Interest 0 0 0  Down, Depressed, Hopeless 0  0  PHQ - 2 Score 0 0 0  Altered sleeping 0 0   Tired, decreased energy 0 0   Change in appetite 0 0   Feeling bad or failure about yourself  0 0   Trouble concentrating 0 0   Moving slowly or fidgety/restless 0 0   Suicidal thoughts 0 0   PHQ-9 Score 0 0   Difficult doing work/chores Not difficult at all        Social History   Tobacco Use  Smoking Status Former   Packs/day: 1.50   Years: 20.00   Total pack years: 30.00   Types: Cigarettes   Quit date: 11/04/1988   Years since quitting: 33.8  Smokeless Tobacco Never   BP Readings from Last 3 Encounters:  08/12/22 110/66  03/13/22 126/80  08/02/21 110/64   Pulse Readings from Last 3 Encounters:  08/12/22 65  03/13/22 70  08/02/21 (!) 59   Wt Readings from Last 3 Encounters:  08/12/22 215 lb 8 oz (97.8 kg)  03/13/22 222 lb 9.6 oz (101 kg)  08/02/21 232 lb (105.2 kg)   BMI Readings from Last 3 Encounters:  08/12/22 34.23 kg/m  03/13/22 36.12 kg/m  08/02/21 35.28 kg/m    Assessment/Interventions: Review of patient past medical history, allergies, medications, health status, including review of consultants reports, laboratory and other test data, was performed as part of comprehensive evaluation and provision of chronic care management services.   SDOH:  (Social Determinants of Health) assessments and interventions performed: Yes  SDOH Interventions    Flowsheet Row Chronic Care Management from 09/17/2022 in [place] at [place] from 08/02/2021 in [place] at [place] Management from 05/10/2021 in [place] at  [place] Interventions -- Intervention Not  Indicated --  Transportation Interventions -- -- Intervention Not Indicated, Other (Comment)  [wife drives]  Financial Strain Interventions Intervention Not Indicated Intervention Not Indicated Intervention Not Indicated  Physical Activity Interventions -- Intervention Not Indicated --  Stress Interventions -- Intervention Not Indicated --  Social Connections Interventions -- Intervention Not Indicated --       SDOH Screenings   Food Insecurity: No Food Insecurity (08/02/2021)  Transportation Needs: No Transportation Needs (05/14/2021)  Alcohol Screen: Low Risk  (08/02/2021)  Depression (PHQ2-9): Low Risk  (08/12/2022)  Financial Resource Strain: Low Risk  (09/17/2022)  Physical Activity: Sufficiently Active (08/02/2021)  Social Connections: Moderately Integrated (08/02/2021)  Stress: No Stress Concern Present (08/02/2021)  Tobacco Use: Medium Risk (08/12/2022)    CCM Care Plan  No Known Allergies  Medications Reviewed Today     Reviewed by Viona Gilmore, [facility] (Pharmacist) on 09/17/22 at [phone]  Med List Status: <None>   Medication Order Taking? Sig Documenting Provider Last Dose Status Informant  ARIPiprazole (ABILIFY) 2 MG tablet [id number]  TAKE 1 TABLET BY MOUTH EVERY DAY Burchette, Alinda Sierras, MD  Active   busPIRone (BUSPAR) 7.5 MG tablet [id number]  TAKE 1 TABLET(7.5 MG) BY MOUTH TWICE DAILY [name], MD  Active   clopidogrel (PLAVIX) 75 MG tablet [id number]  TAKE 1 TABLET(75 MG) BY MOUTH DAILY [name], MD  Active   FLUoxetine (PROZAC) 40 MG capsule [id number]  TAKE 1 CAPSULE(40 MG) BY MOUTH DAILY Burchette, Alinda Sierras, MD  Active   gabapentin (NEURONTIN) 100 MG capsule [id number]  TAKE 1 CAPSULE(100 MG) BY MOUTH TWICE DAILY Burchette, Alinda Sierras, MD  Active   Hypromellose (ARTIFICIAL TEARS OP) [id number]  Place 1 drop into both eyes daily as needed (dry eyes). [provider]   Active Multiple Informants  rosuvastatin (CRESTOR) 40 MG tablet [id number] Yes Take 1 tablet (40 mg total) by mouth daily. [name], MD Taking Active   sildenafil (VIAGRA) 50 MG tablet [id number]  Take 1 tablet (50 mg total) by mouth daily as needed for erectile dysfunction. [name], MD  Active             Patient Active Problem List   Diagnosis Date Noted   SCCA (squamous cell carcinoma) of skin 08/11/2019   Bilateral foot-drop 02/24/2018   Left-sided weakness 10/07/2016   Weakness of left arm 10/07/2016   PTSD (post-traumatic stress disorder)    Bipolar I disorder ([place])    History of stroke    Morbid obesity due to excess calories ([place]) 12/27/2015   OSA on CPAP 12/27/2015   Memory difficulty 12/27/2015   Obesity (BMI 30-39.9) 10/18/2013   Hyperlipidemia 11/29/2010   Depression 11/29/2010   Obstructive sleep apnea 11/29/2010   History of cardiovascular disorder 11/29/2010    Immunization History  Administered Date(s) Administered   Fluad Quad(high Dose 65+) 08/12/2022   Influenza Split 07/25/2011, 08/12/2012   Influenza Whole 09/04/2010   Influenza, High Dose Seasonal PF 09/03/2013, 07/29/2018, 10/14/2019   Influenza,inj,Quad PF,6+ Mos 11/03/2014   Influenza-Unspecified 07/19/2016, 09/05/2017   Moderna SARS-COV2 Booster Vaccination 10/04/2020   Moderna Sars-Covid-2 Vaccination 12/17/2019, 01/15/2020   Pfizer Covid-19 Vaccine Bivalent Booster 61yr & up 08/06/2022   Pneumococcal Conjugate-13 11/03/2014   Pneumococcal Polysaccharide-23 11/04/2009   Rsv, Bivalent, Protein Subunit Rsvpref,pf ([place] 08/06/2022   Td 11/04/2005   Tdap 06/28/2019   Zoster, Live 02/11/2009   Patient reports he is not having any issues with his medications. He is tolerating rosuvastatin without side effects  but has not had his lipid panel rechecked since starting on this.   Patient reports no problems with adherence any more. He states he is "just more conscious" of  taking his medications and has been taking them around meal times. He still uses a weekly pill box and is still not on automatic refills.  Conditions to be addressed/monitored:  Hyperlipidemia, Depression, Anxiety, and Bipolar 1 disorder, history of stroke  Conditions addressed this visit: Hyperlipidemia, anxiety/depression  Care Plan : [place]  Updates made by Viona Gilmore, [place] since 09/17/2022 12:00 AM     Problem: Problem: Hyperlipidemia, Depression, Anxiety, and Bipolar 1 disorder, history of stroke      Long-Range Goal: Patient-Specific Goal   Start Date: 05/10/2021  Expected End Date: 05/10/2022  Recent Progress: On track  Priority: High  Note:   Current Barriers:  Unable to independently monitor therapeutic efficacy Unable to achieve control of cholesterol  Unable to self administer medications as prescribed  Pharmacist Clinical Goal(s):  Patient will achieve adherence to monitoring guidelines and medication adherence to achieve therapeutic efficacy achieve control of cholesterol as evidenced by next lipid panel achieve ability to self administer medications as prescribed through use of setting alarms as evidenced by patient report through collaboration with PharmD and provider.   Interventions: 1:1 collaboration with [name], MD regarding development and update of comprehensive plan of care as evidenced by provider attestation and co-signature Inter-disciplinary care team collaboration (see longitudinal plan of care) Comprehensive medication review performed; medication list updated in electronic medical record  Hyperlipidemia: (LDL goal < 70) -Uncontrolled -Current treatment: Rosuvastatin 40 mg 1 tablet daily at bedtime - Appropriate, Query effective, Safe, Accessible -Medications previously tried: simvastatin (ineffective), fish oil (not needed) -Current dietary patterns: eats out more frequently -Current exercise habits: nothing  consistent -Educated on Cholesterol goals;  Benefits of statin for ASCVD risk reduction; Importance of limiting foods high in cholesterol; Exercise goal of 150 minutes per week; -Counseled on diet and exercise extensively Recommended repeat lipid panel and scheduled lab visit.  History of stroke (Goal: prevent recurrent stroke) -Controlled -Current treatment  Rosuvastatin 40 mg 1 tablet daily - Appropriate, Effective, Safe, Accessible Clopidogrel 75 mg 1 tablet daily -   Appropriate, Effective, Safe, Accessible -Medications previously tried: simvastatin (ineffective) -Recommended to continue current medication Counseled on monitoring for signs of bleeding such as unexplained and excessive bleeding from a cut or injury, easy or excessive bruising, blood in urine or stools, and nosebleeds without a known cause Educated on importance of taking the Plavix consistently for stroke prevention   Depression/Anxiety (Goal: minimize symptoms) -Controlled -Current treatment: Buspirone 7.5 mg 1 tablet twice daily - Appropriate, Effective, Safe, Accessible Fluoxetine 40 mg 1 capsule daily - Appropriate, Effective, Safe, Accessible -Medications previously tried/failed: n/a -PHQ9: 0 -GAD7: n/a -Educated on Benefits of medication for symptom control -Recommended to continue current medication  Bipolar 1 disorder (Goal: minimize symptoms) -Controlled -Current treatment  Aripiprazole 2 mg 1 tablet daily - Appropriate, Effective, Safe, Accessible -Medications previously tried: none  -Recommended to continue current medication  Daytime sleepiness (Goal: minimize daytime sleepiness) -Uncontrolled -Current treatment  None -Medications previously tried: modafinil (pt stopped following with neurology) -Collaborated with PCP for new neurologist referral  Nerve pain (Goal: minimize pain) -Controlled -Current treatment  Gabapentin 100 mg 1 capsule twice daily - Appropriate, Effective, Safe,  Accessible -Medications previously tried: none  -Recommended to continue current medication   Health Maintenance -Vaccine gaps: shingrix -Current therapy:  Sildenafil  50 mg 1 tablet as needed Multivitamin 1 tablet daily Artificial tears as needed -Educated on Cost vs benefit of each product must be carefully weighed by individual consumer -Patient is satisfied with current therapy and denies issues -Recommended to continue current medication  Patient Goals/Self-Care Activities Patient will:  - take medications as prescribed focus on medication adherence by setting an alarm as a reminder to take medications target a minimum of 150 minutes of moderate intensity exercise weekly engage in dietary modifications by limiting fast food  Follow Up Plan: The care management team will reach out to the patient again over the next 120 days.          Medication Assistance: None required.  Patient affirms current coverage meets needs.  Compliance/Adherence/Medication fill history: Care Gaps: Shingrix, COVID booster, AWV Last BP - 110/66 on 08/12/2022  Star-Rating Drugs: Rosuvastatin 40 mg - last filled  06/30/2022 90 DS as [facility]    Patient's preferred pharmacy is:  [facility], [place] [address] AT [address] [place] [address] Phone: [phone] Fax: [phone]   Uses pill box? Yes Pt endorses 70% compliance  We discussed: Benefits of medication synchronization, packaging and delivery as well as enhanced pharmacist oversight with [employer]. Patient decided to: Continue current medication management strategy  Care Plan and Follow Up Patient Decision:  Patient agrees to Care Plan and Follow-up.  Plan: The care management team will reach out to the patient again over the next 120 days.  Jeni Salles, PharmD, [place] Pharmacist [place] at [place]

## 2022-09-17 ENCOUNTER — Ambulatory Visit (INDEPENDENT_AMBULATORY_CARE_PROVIDER_SITE_OTHER): Payer: Medicare PPO | Admitting: Pharmacist

## 2022-09-17 DIAGNOSIS — F339 Major depressive disorder, recurrent, unspecified: Secondary | ICD-10-CM

## 2022-09-17 DIAGNOSIS — E785 Hyperlipidemia, unspecified: Secondary | ICD-10-CM

## 2022-09-17 NOTE — Patient Instructions (Signed)
Hi Richard Fernandez,  It was great to catch up again! Don't forget to get your shingles shot at the pharmacy like we discussed.  Please reach out to me if you have any questions or need anything before our follow up!  Best, Maddie  Jeni Salles, PharmD, [facility]   Visit Information   Goals Addressed   None    Patient Care Plan: CCM Pharmacy Care Plan     Problem Identified: Problem: Hyperlipidemia, Depression, Anxiety, and Bipolar 1 disorder, history of stroke      Long-Range Goal: Patient-Specific Goal   Start Date: 05/10/2021  Expected End Date: 05/10/2022  Recent Progress: On track  Priority: High  Note:   Current Barriers:  Unable to independently monitor therapeutic efficacy Unable to achieve control of cholesterol  Unable to self administer medications as prescribed  Pharmacist Clinical Goal(s):  Patient will achieve adherence to monitoring guidelines and medication adherence to achieve therapeutic efficacy achieve control of cholesterol as evidenced by next lipid panel achieve ability to self administer medications as prescribed through use of setting alarms as evidenced by patient report through collaboration with PharmD and provider.   Interventions: 1:1 collaboration with [name], MD regarding development and update of comprehensive plan of care as evidenced by provider attestation and co-signature Inter-disciplinary care team collaboration (see longitudinal plan of care) Comprehensive medication review performed; medication list updated in electronic medical record  Hyperlipidemia: (LDL goal < 70) -Uncontrolled -Current treatment: Rosuvastatin 40 mg 1 tablet daily at bedtime - Appropriate, Query effective, Safe, Accessible -Medications previously tried: simvastatin (ineffective), fish oil (not needed) -Current dietary patterns: eats out more frequently -Current exercise habits: nothing  consistent -Educated on Cholesterol goals;  Benefits of statin for ASCVD risk reduction; Importance of limiting foods high in cholesterol; Exercise goal of 150 minutes per week; -Counseled on diet and exercise extensively Recommended repeat lipid panel and scheduled lab visit.  History of stroke (Goal: prevent recurrent stroke) -Controlled -Current treatment  Rosuvastatin 40 mg 1 tablet daily - Appropriate, Effective, Safe, Accessible Clopidogrel 75 mg 1 tablet daily -   Appropriate, Effective, Safe, Accessible -Medications previously tried: simvastatin (ineffective) -Recommended to continue current medication Counseled on monitoring for signs of bleeding such as unexplained and excessive bleeding from a cut or injury, easy or excessive bruising, blood in urine or stools, and nosebleeds without a known cause Educated on importance of taking the Plavix consistently for stroke prevention   Depression/Anxiety (Goal: minimize symptoms) -Controlled -Current treatment: Buspirone 7.5 mg 1 tablet twice daily - Appropriate, Effective, Safe, Accessible Fluoxetine 40 mg 1 capsule daily - Appropriate, Effective, Safe, Accessible -Medications previously tried/failed: n/a -PHQ9: 0 -GAD7: n/a -Educated on Benefits of medication for symptom control -Recommended to continue current medication  Bipolar 1 disorder (Goal: minimize symptoms) -Controlled -Current treatment  Aripiprazole 2 mg 1 tablet daily - Appropriate, Effective, Safe, Accessible -Medications previously tried: none  -Recommended to continue current medication  Daytime sleepiness (Goal: minimize daytime sleepiness) -Uncontrolled -Current treatment  None -Medications previously tried: modafinil (pt stopped following with neurology) -Collaborated with PCP for new neurologist referral  Nerve pain (Goal: minimize pain) -Controlled -Current treatment  Gabapentin 100 mg 1 capsule twice daily - Appropriate, Effective, Safe,  Accessible -Medications previously tried: none  -Recommended to continue current medication   Health Maintenance -Vaccine gaps: shingrix -Current therapy:  Sildenafil 50 mg 1 tablet as needed Multivitamin 1 tablet daily Artificial tears as needed -Educated on Cost vs benefit of each  product must be carefully weighed by individual consumer -Patient is satisfied with current therapy and denies issues -Recommended to continue current medication  Patient Goals/Self-Care Activities Patient will:  - take medications as prescribed focus on medication adherence by setting an alarm as a reminder to take medications target a minimum of 150 minutes of moderate intensity exercise weekly engage in dietary modifications by limiting fast food  Follow Up Plan: The care management team will reach out to the patient again over the next 120 days.         Patient verbalizes understanding of instructions and care plan provided today and agrees to view in [place]. Active MyChart status and patient understanding of how to access instructions and care plan via MyChart confirmed with patient.    The pharmacy team will reach out to the patient again over the next 120 days.   Viona Gilmore, [facility]

## 2022-09-19 DIAGNOSIS — Z789 Other specified health status: Secondary | ICD-10-CM | POA: Diagnosis not present

## 2022-09-19 DIAGNOSIS — L298 Other pruritus: Secondary | ICD-10-CM | POA: Diagnosis not present

## 2022-09-19 DIAGNOSIS — C44622 Squamous cell carcinoma of skin of right upper limb, including shoulder: Secondary | ICD-10-CM | POA: Diagnosis not present

## 2022-09-19 DIAGNOSIS — D485 Neoplasm of uncertain behavior of skin: Secondary | ICD-10-CM | POA: Diagnosis not present

## 2022-09-19 DIAGNOSIS — L538 Other specified erythematous conditions: Secondary | ICD-10-CM | POA: Diagnosis not present

## 2022-09-19 DIAGNOSIS — L570 Actinic keratosis: Secondary | ICD-10-CM | POA: Diagnosis not present

## 2022-09-19 DIAGNOSIS — L820 Inflamed seborrheic keratosis: Secondary | ICD-10-CM | POA: Diagnosis not present

## 2022-09-20 ENCOUNTER — Other Ambulatory Visit: Payer: Medicare PPO

## 2022-09-30 ENCOUNTER — Other Ambulatory Visit: Payer: Medicare PPO

## 2022-09-30 DIAGNOSIS — E7849 Other hyperlipidemia: Secondary | ICD-10-CM

## 2022-09-30 LAB — LIPID PANEL
Cholesterol: 132 mg/dL (ref 0–200)
HDL: 47.4 mg/dL (ref >39.00)
LDL Cholesterol: 70 mg/dL (ref 0–99)
NonHDL: 84.31
Total CHOL/HDL Ratio: 3
Triglycerides: 72 mg/dL (ref 0.0–149.0)
VLDL: 14.4 mg/dL (ref 0.0–40.0)

## 2022-09-30 LAB — HEPATIC FUNCTION PANEL
ALT: 19 U/L (ref 0–53)
AST: 19 U/L (ref 0–37)
Albumin: 3.9 g/dL (ref 3.5–5.2)
Alkaline Phosphatase: 61 U/L (ref 39–117)
Bilirubin, Direct: 0.1 mg/dL (ref 0.0–0.3)
Total Bilirubin: 0.4 mg/dL (ref 0.2–1.2)
Total Protein: 6.8 g/dL (ref 6.0–8.3)

## 2022-09-30 NOTE — Addendum Note (Signed)
Addended by: Rosalyn Gess D on: 09/30/2022 12:36 PM   Modules accepted: Orders

## 2022-10-01 ENCOUNTER — Other Ambulatory Visit: Payer: Self-pay | Admitting: Family Medicine

## 2022-10-01 DIAGNOSIS — E7849 Other hyperlipidemia: Secondary | ICD-10-CM

## 2022-10-03 DIAGNOSIS — E785 Hyperlipidemia, unspecified: Secondary | ICD-10-CM | POA: Diagnosis not present

## 2022-10-03 DIAGNOSIS — F319 Bipolar disorder, unspecified: Secondary | ICD-10-CM | POA: Diagnosis not present

## 2022-10-25 ENCOUNTER — Ambulatory Visit (INDEPENDENT_AMBULATORY_CARE_PROVIDER_SITE_OTHER): Payer: Medicare PPO | Admitting: Family Medicine

## 2022-10-25 ENCOUNTER — Encounter: Payer: Self-pay | Admitting: Family Medicine

## 2022-10-25 VITALS — BP 110/70 | HR 67 | Temp 98.0°F | Resp 16 | Ht 66.54 in | Wt 205.2 lb

## 2022-10-25 DIAGNOSIS — R110 Nausea: Secondary | ICD-10-CM | POA: Diagnosis not present

## 2022-10-25 DIAGNOSIS — R1013 Epigastric pain: Secondary | ICD-10-CM | POA: Diagnosis not present

## 2022-10-25 MED ORDER — ONDANSETRON HCL 4 MG PO TABS: 4.0000 mg | ORAL_TABLET | Freq: Two times a day (BID) | ORAL | 0 refills | Status: AC | PRN

## 2022-10-25 NOTE — Patient Instructions (Addendum)
A few things to remember from today's visit:  Nausea without vomiting - Plan: ondansetron (ZOFRAN) 4 MG tablet  Dyspepsia  For now try to eat more frequent, 3-4 times, small bites. Zofran sent to your pharmacy to help with nausea. Monitor for new symptoms. We could try Omeprazole 20 mg daily if not better. Please follow with your PCP if nausea has not resolved in 2 weeks.  If you need refills for medications you take chronically, please call your pharmacy. Do not use My Chart to request refills or for acute issues that need immediate attention. If you send a my chart message, it may take a few days to be addressed, specially if I am not in the office.  Please be sure medication list is accurate. If a new problem present, please set up appointment sooner than planned today.

## 2022-10-25 NOTE — Progress Notes (Signed)
ACUTE VISIT Chief Complaint  Patient presents with   Nausea    Started on Sunday   HPI: Mr.Markeese Krichbaum is a 81 y.o. male with past medical history significant for OSA, PTSD, hyperlipidemia, depression, bipolar 1 disorder,  and CVA here today complaining of 5 days of intermittent nausea.  He reports that he has experienced a similar episode a long time ago. He states that there is no specific pattern to the nausea, occurring at various times throughout the day.   He denies any associated symptoms such as sore throat, dysphagia, cough, CP, palpitations, dyspnea, heartburn, abdominal pain, vomiting, or changes in bowel habits.  Denies MS changes or recent falls/trauma. His last bowel movement was yesterday, with no reported blood or changes in color, and he is urinating normally. He has not identified exacerbating or alleviating factors but mentions that it is mildly abated with food intake. He eats twice daily. No changes in dietary habits. He is not taking any over-the-counter medications for the nausea and reports that the problem has been stable. He denies any new medications or increased anxiety.   Review of Systems  Constitutional:  Negative for activity change, appetite change and fever.  Respiratory:  Negative for choking, wheezing and stridor.   Genitourinary:  Negative for decreased urine volume, dysuria and hematuria.  Skin:  Negative for rash.  Neurological:  Negative for syncope and headaches.  See other pertinent positives and negatives in HPI.  Current Outpatient Medications on File Prior to Visit  Medication Sig Dispense Refill   ARIPiprazole (ABILIFY) 2 MG tablet TAKE 1 TABLET BY MOUTH EVERY DAY 30 tablet 1   busPIRone (BUSPAR) 7.5 MG tablet TAKE 1 TABLET(7.5 MG) BY MOUTH TWICE DAILY 60 tablet 5   clopidogrel (PLAVIX) 75 MG tablet TAKE 1 TABLET(75 MG) BY MOUTH DAILY 90 tablet 3   FLUoxetine (PROZAC) 40 MG capsule TAKE 1 CAPSULE(40 MG) BY MOUTH DAILY 90 capsule 3    gabapentin (NEURONTIN) 100 MG capsule TAKE 1 CAPSULE(100 MG) BY MOUTH TWICE DAILY 180 capsule 3   Hypromellose (ARTIFICIAL TEARS OP) Place 1 drop into both eyes daily as needed (dry eyes).     rosuvastatin (CRESTOR) 40 MG tablet TAKE 1 TABLET(40 MG) BY MOUTH DAILY 90 tablet 1   sildenafil (VIAGRA) 50 MG tablet Take 1 tablet (50 mg total) by mouth daily as needed for erectile dysfunction. 10 tablet 11   No current facility-administered medications on file prior to visit.   Past Medical History:  Diagnosis Date   CEREBROVASCULAR ACCIDENT, HX OF 11/29/2010   DEPRESSION 11/29/2010   Hearing loss    HYPERLIPIDEMIA 11/29/2010   OBSTRUCTIVE SLEEP APNEA 11/29/2010   CPAP   PTSD (post-traumatic stress disorder)    Stroke (HCC)    No Known Allergies  Social History   Socioeconomic History   Marital status: Married    Spouse name: [name]    Number of children: 1   Years of education: college   Highest education level: Not on file  Occupational History   Occupation: part time counselor   Tobacco Use   Smoking status: Former    Packs/day: 1.50    Years: 20.00    Total pack years: 30.00    Types: Cigarettes    Quit date: 11/04/1988    Years since quitting: 33.9   Smokeless tobacco: Never  Vaping Use   Vaping Use: Never used  Substance and Sexual Activity   Alcohol use: No    Alcohol/week: 0.0 standard drinks of  alcohol   Drug use: No   Sexual activity: Not on file  Other Topics Concern   Not on file  Social History Narrative   Pts is adopted. Patient lives at home with his [name].   Retired but still working some.   Education some college.   Left handed.   Caffeine four cups of coffee daily.      Social Determinants of Health   Financial Resource Strain: Low Risk  (09/17/2022)   Overall Financial Resource Strain (CARDIA)    Difficulty of Paying Living Expenses: Not very hard  Food Insecurity: No Food Insecurity (08/02/2021)   Hunger Vital Sign    Worried About Running Out  of Food in the Last Year: Never true    Ran Out of Food in the Last Year: Never true  Transportation Needs: No Transportation Needs (05/14/2021)   PRAPARE - Hydrologist (Medical): No    Lack of Transportation (Non-Medical): No  Physical Activity: Sufficiently Active (08/02/2021)   Exercise Vital Sign    Days of Exercise per Week: 5 days    Minutes of Exercise per Session: 30 min  Stress: No Stress Concern Present (08/02/2021)   [place]    Feeling of Stress : Not at all  Social Connections: Moderately Integrated (08/02/2021)   Social Connection and Isolation Panel [NHANES]    Frequency of Communication with Friends and Family: Three times a week    Frequency of Social Gatherings with Friends and Family: Three times a week    Attends Religious Services: More than 4 times per year    Active Member of Clubs or Organizations: No    Attends Archivist Meetings: Never    Marital Status: Married   Vitals:   10/25/22 0919  BP: 110/70  Pulse: 67  Resp: 16  Temp: 98 F (36.7 C)  SpO2: 97%  Body mass index is 32.59 kg/m.  Physical Exam Vitals and nursing note reviewed.  Constitutional:      General: He is not in acute distress.    Appearance: He is well-developed.  HENT:     Head: Normocephalic and atraumatic.     Mouth/Throat:     Mouth: Mucous membranes are moist.  Eyes:     Conjunctiva/sclera: Conjunctivae normal.  Cardiovascular:     Rate and Rhythm: Normal rate and regular rhythm.     Heart sounds: No murmur heard.    Comments: Trace pitting LE edema, bilateral. Pulmonary:     Effort: Pulmonary effort is normal. No respiratory distress.     Breath sounds: Normal breath sounds.  Abdominal:     Palpations: Abdomen is soft. There is no hepatomegaly or mass.     Tenderness: There is no abdominal tenderness.  Lymphadenopathy:     Cervical: No cervical adenopathy.   Skin:    General: Skin is warm.     Findings: No erythema or rash.  Neurological:     Mental Status: He is alert and oriented to person, place, and time.     Comments: Mild unstable gait, not assisted.  Psychiatric:        Mood and Affect: Mood and affect normal.   ASSESSMENT AND PLAN: Mr.Daden was seen today for nausea.  Diagnoses and all orders for this visit:  Nausea without vomiting We discussed possible etiologies. Some of his medications can be contributing factor but he has been taking them for a while. History and examination  today do not suggest a serious process. I would like to do some blood work today but he prefers to hold on further workup for now. He would like something to help with the nausea. He seems to be improved with food intake, so recommend increasing frequency of food intake, a small portion/bites 3-4 times per day. He was clearly instructed about warning signs.  -     ondansetron (ZOFRAN) 4 MG tablet; Take 1 tablet (4 mg total) by mouth 2 (two) times daily as needed for up to 5 days for nausea or vomiting.  Dyspepsia Trial for omeprazole 20 mg can be considered, he prefers to wait on adding more medication at this time. Try to avoid foods that could aggravate problem.  Return if symptoms worsen or fail to improve.  Louis Gaw G. Martinique, MD  [facility]. [place] office.

## 2022-10-29 ENCOUNTER — Other Ambulatory Visit: Payer: Self-pay | Admitting: Family Medicine

## 2022-11-01 ENCOUNTER — Telehealth: Payer: Self-pay | Admitting: Family Medicine

## 2022-11-01 NOTE — Telephone Encounter (Signed)
I left patient a detailed message with the information below & advised him to call back with any questions and to schedule appt for next week. Advised to seek care over the weekend if not improving.

## 2022-11-01 NOTE — Telephone Encounter (Signed)
I left patient a voicemail to return my call. 

## 2022-11-01 NOTE — Telephone Encounter (Signed)
Pt was transferred to Triage Nurse several times.   Pt is not feeling well (light headed & dizzy)  Pt says Triage told him someone would call him back and never did, then they put him on hold for a long time, then disconnected call. Then Triage transferred him to a line that was not in service.   Pt no longer wants to speak to Triage and would like a call back asap from MD or CMA.

## 2022-11-01 NOTE — Telephone Encounter (Signed)
Pt called back & I scheduled him for the soonest available appointment  (Friday - 11/08/22)

## 2022-11-01 NOTE — Telephone Encounter (Signed)
I spoke with patient. He started having dizzy spells and not feeling well a few hours ago. He has not had any falls or injuries. Please advise

## 2022-11-08 ENCOUNTER — Ambulatory Visit: Payer: Medicare PPO | Admitting: Family Medicine

## 2022-11-20 DIAGNOSIS — C44622 Squamous cell carcinoma of skin of right upper limb, including shoulder: Secondary | ICD-10-CM | POA: Diagnosis not present

## 2022-11-25 ENCOUNTER — Telehealth: Payer: Self-pay | Admitting: Family Medicine

## 2022-11-25 DIAGNOSIS — Z0100 Encounter for examination of eyes and vision without abnormal findings: Secondary | ICD-10-CM

## 2022-11-25 NOTE — Telephone Encounter (Signed)
Pt is asking for an eye dr referral.  Pt is asking for a call back.

## 2022-11-25 NOTE — Telephone Encounter (Signed)
I spoke with the patient and he stated that his old Provider at [facility] is no longer with the practice and he is not impressed with new provider he has been seeing. Patient requested a referral to another practice.

## 2022-11-26 NOTE — Telephone Encounter (Signed)
Referral placed and patient aware 

## 2022-12-23 ENCOUNTER — Telehealth: Payer: Self-pay | Admitting: Family Medicine

## 2022-12-23 MED ORDER — CLOPIDOGREL BISULFATE 75 MG PO TABS: ORAL_TABLET | ORAL | 1 refills | Status: DC

## 2022-12-23 NOTE — Telephone Encounter (Signed)
Rx sent 

## 2022-12-23 NOTE — Telephone Encounter (Signed)
Prescription Request  12/23/2022  Is this a "Controlled Substance" medicine? No  LOV: 08/12/2022  What is the name of the medication or equipment? clopidogrel (PLAVIX) 75 MG tablet   Have you contacted your pharmacy to request a refill? No   Which pharmacy would you like this sent to?  [facility], [address] AT [address] [place] [address] Phone: [phone] Fax: [phone]    Patient notified that their request is being sent to the clinical staff for review and that they should receive a response within 2 business days.   Please advise at [place]

## 2022-12-26 ENCOUNTER — Ambulatory Visit: Payer: Medicare PPO | Admitting: Podiatry

## 2022-12-26 DIAGNOSIS — B351 Tinea unguium: Secondary | ICD-10-CM | POA: Diagnosis not present

## 2022-12-26 DIAGNOSIS — L600 Ingrowing nail: Secondary | ICD-10-CM

## 2022-12-26 NOTE — Patient Instructions (Signed)
Keep a small amount of antibiotic ointment and bandage on the left 5th toe until healed. You can leave it open at night.   Monitor for any signs/symptoms of infection. Call the office immediately if any occur or go directly to the emergency room. Call with any questions/concerns.

## 2022-12-30 ENCOUNTER — Encounter: Payer: Self-pay | Admitting: Family Medicine

## 2022-12-30 ENCOUNTER — Ambulatory Visit (INDEPENDENT_AMBULATORY_CARE_PROVIDER_SITE_OTHER): Payer: Medicare PPO | Admitting: Family Medicine

## 2022-12-30 VITALS — BP 128/80 | HR 70 | Temp 97.6°F | Resp 16 | Ht 66.54 in | Wt 197.5 lb

## 2022-12-30 DIAGNOSIS — S61412A Laceration without foreign body of left hand, initial encounter: Secondary | ICD-10-CM | POA: Diagnosis not present

## 2022-12-30 MED ORDER — DOXYCYCLINE HYCLATE 100 MG PO TABS: 100.0000 mg | ORAL_TABLET | Freq: Two times a day (BID) | ORAL | 0 refills | Status: AC

## 2022-12-30 NOTE — Patient Instructions (Addendum)
A few things to remember from today's visit:  Laceration of left hand without foreign body, initial encounter Keep wound clean with soap and water. Uncover for a few hours during the day and cover at night. Monitor for signs of infection, if any start antibiotic treatment.  If you need refills for medications you take chronically, please call your pharmacy. Do not use My Chart to request refills or for acute issues that need immediate attention. If you send a my chart message, it may take a few days to be addressed, specially if I am not in the office.  Please be sure medication list is accurate. If a new problem present, please set up appointment sooner than planned today.

## 2022-12-30 NOTE — Progress Notes (Signed)
ACUTE VISIT Chief Complaint  Patient presents with   cuts on hand    Broke up dog fight on Friday night, dogs utd on vaccines. Last Tdap in 2020.   HPI: Mr.Richard Fernandez is a 82 y.o. male with PMHx significant for OSA, HLD, CVA with residual left-sided weakness, and depression here today complaining of left hand wounds. Hand Injury  The incident occurred 3 to 5 days ago. [place] the park. The pain is present in the left hand. The pain does not radiate. The pain is moderate. The pain has been Constant since the incident. Pertinent negatives include no chest pain or numbness. The symptoms are aggravated by movement and palpation. He has tried rest for the symptoms.   He had  left hand injury sustained on Friday while attempting to break up a dog fight involving one of his own dogs. The injury was caused by the leash digging into his hand when he was trying to hold his dog, somehow the leash went around his hand causing injury.  Denies any  scratch or bite from the dogs.  Area is sore, no erythema or purulent drainage. Pain exacerbated by movement and alleviated by rest.  He reports having washed the wound with hot water and soap and applied a gauze, which he changed twice since the incident.  Pain, oozing at times. Negative for fever,chills,numbness,tingling,or changes in left hand joints ROM.  He takes Plavix  75 mg daily.  Review of Systems  Respiratory:  Negative for shortness of breath.   Cardiovascular:  Negative for chest pain and palpitations.  Gastrointestinal:  Negative for abdominal pain, nausea and vomiting.  Musculoskeletal:  Positive for arthralgias.  Skin:  Negative for pallor.  Neurological:  Negative for syncope and numbness.  See other pertinent positives and negatives in HPI.  Current Outpatient Medications on File Prior to Visit  Medication Sig Dispense Refill   ARIPiprazole (ABILIFY) 2 MG tablet TAKE 1 TABLET BY MOUTH EVERY DAY 30 tablet 3    busPIRone (BUSPAR) 7.5 MG tablet TAKE 1 TABLET(7.5 MG) BY MOUTH TWICE DAILY 60 tablet 5   clopidogrel (PLAVIX) 75 MG tablet TAKE 1 TABLET(75 MG) BY MOUTH DAILY 90 tablet 1   FLUoxetine (PROZAC) 40 MG capsule TAKE 1 CAPSULE(40 MG) BY MOUTH DAILY 90 capsule 3   gabapentin (NEURONTIN) 100 MG capsule TAKE 1 CAPSULE(100 MG) BY MOUTH TWICE DAILY 180 capsule 3   Hypromellose (ARTIFICIAL TEARS OP) Place 1 drop into both eyes daily as needed (dry eyes).     rosuvastatin (CRESTOR) 40 MG tablet TAKE 1 TABLET(40 MG) BY MOUTH DAILY 90 tablet 1   sildenafil (VIAGRA) 50 MG tablet Take 1 tablet (50 mg total) by mouth daily as needed for erectile dysfunction. 10 tablet 11   No current facility-administered medications on file prior to visit.   Past Medical History:  Diagnosis Date   CEREBROVASCULAR ACCIDENT, HX OF 11/29/2010   DEPRESSION 11/29/2010   Hearing loss    HYPERLIPIDEMIA 11/29/2010   OBSTRUCTIVE SLEEP APNEA 11/29/2010   CPAP   PTSD (post-traumatic stress disorder)    Stroke (HCC)    No Known Allergies  Social History   Socioeconomic History   Marital status: Married    Spouse name: [name]    Number of children: 1   Years of education: college   Highest education level: Not on file  Occupational History   Occupation: part time counselor   Tobacco Use   Smoking status: Former  Packs/day: 1.50    Years: 20.00    Total pack years: 30.00    Types: Cigarettes    Quit date: 11/04/1988    Years since quitting: 34.1   Smokeless tobacco: Never  Vaping Use   Vaping Use: Never used  Substance and Sexual Activity   Alcohol use: No    Alcohol/week: 0.0 standard drinks of alcohol   Drug use: No   Sexual activity: Not on file  Other Topics Concern   Not on file  Social History Narrative   Pts is adopted. Patient lives at home with his [name].   Retired but still working some.   Education some college.   Left handed.   Caffeine four cups of coffee daily.      Social Determinants of  Health   Financial Resource Strain: Low Risk  (09/17/2022)   Overall Financial Resource Strain (CARDIA)    Difficulty of Paying Living Expenses: Not very hard  Food Insecurity: No Food Insecurity (08/02/2021)   Hunger Vital Sign    Worried About Running Out of Food in the Last Year: Never true    Ran Out of Food in the Last Year: Never true  Transportation Needs: No Transportation Needs (05/14/2021)   PRAPARE - Hydrologist (Medical): No    Lack of Transportation (Non-Medical): No  Physical Activity: Sufficiently Active (08/02/2021)   Exercise Vital Sign    Days of Exercise per Week: 5 days    Minutes of Exercise per Session: 30 min  Stress: No Stress Concern Present (08/02/2021)   [place]    Feeling of Stress : Not at all  Social Connections: Moderately Integrated (08/02/2021)   Social Connection and Isolation Panel [NHANES]    Frequency of Communication with Friends and Family: Three times a week    Frequency of Social Gatherings with Friends and Family: Three times a week    Attends Religious Services: More than 4 times per year    Active Member of Clubs or Organizations: No    Attends Archivist Meetings: Never    Marital Status: Married    Vitals:   12/30/22 1421  BP: 128/80  Pulse: 70  Resp: 16  Temp: 97.6 F (36.4 C)  SpO2: 96%   Body mass index is 31.36 kg/m.  Physical Exam Vitals and nursing note reviewed.  Constitutional:      General: He is not in acute distress.    Appearance: He is well-developed.  HENT:     Head: Normocephalic and atraumatic.  Eyes:     Conjunctiva/sclera: Conjunctivae normal.  Cardiovascular:     Rate and Rhythm: Normal rate and regular rhythm.     Heart sounds: Murmur (Soft SEM RUSB) heard.  Pulmonary:     Effort: Pulmonary effort is normal. No respiratory distress.     Breath sounds: Normal breath sounds.  Musculoskeletal:      Left hand: No bony tenderness. Normal range of motion. Normal capillary refill. Normal pulse.       Hands:     Comments:    Skin:    General: Skin is warm.     Findings: Abrasion and ecchymosis present. No erythema or rash.  Neurological:     Mental Status: He is alert and oriented to person, place, and time.  Psychiatric:     Comments: Well groomed, good eye contact.   ASSESSMENT AND PLAN: Mr. Berkenpas was seen today for left hand  injury/laceration.  Laceration of left hand without foreign body, initial encounter -     Doxycycline Hyclate; Take 1 tablet (100 mg total) by mouth 2 (two) times daily for 7 days.  Dispense: 14 tablet; Refill: 0  Cleaned area with water, applied abx oint around wound and covered with non stick bandage and wrapped with self-adhesive elastic bandage. Instructed to keep area uncovered for a few hours during the day. Keep it clean with soap and water, elevation.  On antiplatelet therapy, increasing risk of bleeding. Instructed to apply pressure for a few minutes if bleeding occurs. I do not think abx is needed at this time. He was educated about signs of infection and instructed to start Doxycycline if any. Some side effects discussed. Instructed about warning signs. F/U in 2-3 weeks with PCP.  I spent a total of 30 minutes in both face to face and non face to face activities for this visit on the date of this encounter. During this time history was obtained and documented, examination was performed, cleaning and dressing wound with and assessment/plan discussed.  Return in about 2 weeks (around 01/13/2023), or if symptoms worsen or fail to improve.  Jemima Petko G. Martin[place], MD  [facility]. [place] office.

## 2023-01-01 NOTE — Progress Notes (Signed)
Subjective:   Patient ID: [name], male   DOB: 82 y.o.   MRN: [id number]   HPI Chief Complaint  Patient presents with   Ingrown Toenail    Great toe L foot fungus on nail. ingrown left foot 5th toe, 2 weeks ago, patient denies any drainage   82 year old male presents the office for above concerns.  He is pleasant cooperative toenail also get ingrown toenail left fifth toe about 2 weeks ago.  He denies any drainage or pus at this time.  Area is tender.  He has no other concerns today.  No fevers or chills.   Review of Systems  All other systems reviewed and are negative.  Past Medical History:  Diagnosis Date   CEREBROVASCULAR ACCIDENT, HX OF 11/29/2010   DEPRESSION 11/29/2010   Hearing loss    HYPERLIPIDEMIA 11/29/2010   OBSTRUCTIVE SLEEP APNEA 11/29/2010   CPAP   PTSD (post-traumatic stress disorder)    Stroke [facility])     Past Surgical History:  Procedure Laterality Date   BACK SURGERY  1962   HAND SURGERY     KNEE SURGERY     SHOULDER ARTHROSCOPY WITH ROTATOR CUFF REPAIR       Current Outpatient Medications:    ARIPiprazole (ABILIFY) 2 MG tablet, TAKE 1 TABLET BY MOUTH EVERY DAY, Disp: 30 tablet, Rfl: 3   busPIRone (BUSPAR) 7.5 MG tablet, TAKE 1 TABLET(7.5 MG) BY MOUTH TWICE DAILY, Disp: 60 tablet, Rfl: 5   clopidogrel (PLAVIX) 75 MG tablet, TAKE 1 TABLET(75 MG) BY MOUTH DAILY, Disp: 90 tablet, Rfl: 1   doxycycline (VIBRA-TABS) 100 MG tablet, Take 1 tablet (100 mg total) by mouth 2 (two) times daily for 7 days., Disp: 14 tablet, Rfl: 0   FLUoxetine (PROZAC) 40 MG capsule, TAKE 1 CAPSULE(40 MG) BY MOUTH DAILY, Disp: 90 capsule, Rfl: 3   gabapentin (NEURONTIN) 100 MG capsule, TAKE 1 CAPSULE(100 MG) BY MOUTH TWICE DAILY, Disp: 180 capsule, Rfl: 3   Hypromellose (ARTIFICIAL TEARS OP), Place 1 drop into both eyes daily as needed (dry eyes)., Disp: , Rfl:    rosuvastatin (CRESTOR) 40 MG tablet, TAKE 1 TABLET(40 MG) BY MOUTH DAILY, Disp: 90 tablet, Rfl: 1   sildenafil (VIAGRA)  50 MG tablet, Take 1 tablet (50 mg total) by mouth daily as needed for erectile dysfunction., Disp: 10 tablet, Rfl: 11  No Known Allergies         Objective:  Physical Exam  General: AAO x3, NAD  Dermatological: Incurvation present to the left fifth toe nail on the right foot there is a corn just adjacent to this as well.  On debridement there is minimal bleeding but there is no probing, and or tunneling.  No fluctuation or crepitation.  No malodor.  Nails hypertrophic, dystrophic with yellow, red discoloration most of the hallux.  No pain in the nails at this time.  Vascular: Dorsalis Pedis artery and Posterior Tibial artery pedal pulses are 2/4 bilateral with immedate capillary fill time. There is no pain with calf compression, swelling, warmth, erythema.   Neruologic: Grossly intact via light touch bilateral.   Musculoskeletal: No gross boney pedal deformities bilateral. No pain, crepitus, or limitation noted with foot and ankle range of motion bilateral. Muscular strength 5/5 in all groups tested bilateral.  Gait: Unassisted, Nonantalgic.       Assessment:   Ingrown toenail, skin lesion right fifth toe; toenail fungus     Plan:  -Treatment options discussed including all alternatives, risks, and complications -Etiology of  symptoms were discussed -Sharply debrided the right fifth toenail with any complications as well as performed.  Mild bleeding occurred to remove this.  No signs of infection at this time.  Recommend antibiotic ointment dressing changes daily.  Monitor for any signs or symptoms of infection. -We discussed different treatment options for nail fungus including oral, topical as well as alternative treatments.  Legrand Como start with topical medication.  Discussed different options for this today.  Trula Slade DPM       Trula Slade DPM

## 2023-01-02 ENCOUNTER — Other Ambulatory Visit: Payer: Self-pay | Admitting: Family Medicine

## 2023-01-13 ENCOUNTER — Ambulatory Visit: Payer: Medicare PPO | Admitting: Family Medicine

## 2023-01-14 ENCOUNTER — Ambulatory Visit (INDEPENDENT_AMBULATORY_CARE_PROVIDER_SITE_OTHER): Payer: Medicare PPO | Admitting: Family Medicine

## 2023-01-14 ENCOUNTER — Encounter: Payer: Self-pay | Admitting: Family Medicine

## 2023-01-14 VITALS — BP 100/62 | HR 83 | Temp 98.1°F | Ht 66.0 in | Wt 197.5 lb

## 2023-01-14 DIAGNOSIS — R413 Other amnesia: Secondary | ICD-10-CM | POA: Diagnosis not present

## 2023-01-14 DIAGNOSIS — S61412D Laceration without foreign body of left hand, subsequent encounter: Secondary | ICD-10-CM

## 2023-01-14 DIAGNOSIS — E538 Deficiency of other specified B group vitamins: Secondary | ICD-10-CM | POA: Diagnosis not present

## 2023-01-14 DIAGNOSIS — F330 Major depressive disorder, recurrent, mild: Secondary | ICD-10-CM | POA: Diagnosis not present

## 2023-01-14 NOTE — Progress Notes (Signed)
Established Patient Office Visit  Subjective   Patient ID: Richard Fernandez, male    DOB: 06/09/1941  Age: 82 y.o. MRN: [id number]  Chief Complaint  Patient presents with   Laceration    Patient presents for follow on the left hand laceration   Memory Loss    Patient states he is concerned about memory loss not getting better for the past few months    HPI   Richard Fernandez is seen for the following items  Recent left hand injury.  Was seen here after his dog was attacked by another dog in the neighborhood and leash was wrapped around his left hand and pulled tightly tearing the skin in a couple spots dorsally.  He has been changing dressing every few days.  He was prescribed doxycycline but never took that.  Has not seen any erythema or any drainage or warmth.  No associated pain.  No other injuries reported.  He has other chronic problems including obstructive sleep apnea, bipolar disorder, history of CVA, history of recurrent depression, hyperlipidemia, history of posttraumatic stress disorder.  He is a [place] vet.  Current medication include BuSpar, Plavix, Abilify, Crestor, Prozac, gabapentin.  He states his mood is stable.  He does express concerns regarding difficulties cognitively at times.  He states he frequently "loses train of thought ".  He does still drive and has had no difficulties with driving.  He had CT head 5/21 which showed age-related volume loss with mild periventricular small vessel disease but no acute infarct.  He states he could not have an MRI of the brain because of claustrophobia..   Family history is unknown as he is adopted.  He takes Crestor for hyperlipidemia and had lipids checked last fall which were stable.  Past Medical History:  Diagnosis Date   CEREBROVASCULAR ACCIDENT, HX OF 11/29/2010   DEPRESSION 11/29/2010   Hearing loss    HYPERLIPIDEMIA 11/29/2010   OBSTRUCTIVE SLEEP APNEA 11/29/2010   CPAP   PTSD (post-traumatic stress disorder)    Stroke [facility])     Past Surgical History:  Procedure Laterality Date   BACK SURGERY  1962   HAND SURGERY     KNEE SURGERY     SHOULDER ARTHROSCOPY WITH ROTATOR CUFF REPAIR      reports that he quit smoking about 34 years ago. His smoking use included cigarettes. He has a 30.00 pack-year smoking history. He has never used smokeless tobacco. He reports that he does not drink alcohol and does not use drugs. family history includes Breast cancer in his mother; Heart failure in his father. He was adopted. No Known Allergies   Review of Systems  Constitutional:  Negative for chills, fever and malaise/fatigue.  Eyes:  Negative for blurred vision.  Respiratory:  Negative for shortness of breath.   Cardiovascular:  Negative for chest pain.  Neurological:  Negative for dizziness, speech change, focal weakness, weakness and headaches.  Psychiatric/Behavioral:  Negative for depression.       Objective:     BP 100/62 (BP Location: Left Arm, Patient Position: Sitting, Cuff Size: Normal)   Pulse 83   Temp 98.1 F (36.7 C) (Oral)   Ht '5\' 6"'$  (1.676 m)   Wt 197 lb 8 oz (89.6 kg)   SpO2 97%   BMI 31.88 kg/m  BP Readings from Last 3 Encounters:  01/14/23 100/62  12/30/22 128/80  10/25/22 110/70   Wt Readings from Last 3 Encounters:  01/14/23 197 lb 8 oz (89.6 kg)  12/30/22  197 lb 8 oz (89.6 kg)  10/25/22 205 lb 4 oz (93.1 kg)      Physical Exam Vitals reviewed.  Constitutional:      General: He is not in acute distress.    Appearance: Normal appearance.  Cardiovascular:     Rate and Rhythm: Normal rate and regular rhythm.  Pulmonary:     Effort: Pulmonary effort is normal.     Breath sounds: Normal breath sounds. No wheezing or rales.  Skin:    Comments: Left hand dorsally reveals couple of eschars as previously noted.  No erythema.  No visible swelling.  No purulent drainage.  Neurological:     General: No focal deficit present.     Mental Status: He is alert.     Cranial Nerves: No  cranial nerve deficit.     Comments: MMSE 22/30      No results found for any visits on 01/14/23.    The ASCVD Risk score (Arnett DK, et al., 2019) failed to calculate for the following reasons:   The 2019 ASCVD risk score is only valid for ages 67 to 37   The patient has a prior MI or stroke diagnosis    Assessment & Plan:   #1 recent left hand injury with couple of wounds which are healing well without complication.  No signs of secondary infection.  Continue to clean every day or 2 with soap and water and apply a little bit of topical Vaseline.  Should be able to leave uncovered in a few days.  Wounds were redressed today  #2 memory loss/cognitive impairment.  Scored 22/30 on MMSE.  Check labs with TSH, B12, RPR.  We discussed possible referral for neurocognitive assessment with neuropsychologist but he declines.  We discussed imaging such as MRI but he declines.  We also discussed possible medications such as Aricept or Namenda but he is not interested at this point.  #3 history of recurrent depression currently stable on Abilify, Prozac, and BuSpar  Set up 2-monthfollow-up to reassess   Return in about 6 months (around 07/17/2023).    Richard Littler MD

## 2023-01-15 LAB — TSH: TSH: 2.19 u[IU]/mL (ref 0.35–5.50)

## 2023-01-15 LAB — VITAMIN B12: Vitamin B-12: 158 pg/mL — ABNORMAL LOW (ref 211–911)

## 2023-01-15 LAB — RPR: RPR Ser Ql: NONREACTIVE

## 2023-01-17 ENCOUNTER — Telehealth: Payer: Self-pay | Admitting: Family Medicine

## 2023-01-17 NOTE — Addendum Note (Signed)
Addended by: Nilda Riggs on: 01/17/2023 01:26 PM   Modules accepted: Orders

## 2023-01-17 NOTE — Telephone Encounter (Signed)
Pt is calling and would like blood results

## 2023-01-17 NOTE — Telephone Encounter (Signed)
Please see result note 

## 2023-01-20 ENCOUNTER — Ambulatory Visit (INDEPENDENT_AMBULATORY_CARE_PROVIDER_SITE_OTHER): Payer: Medicare PPO

## 2023-01-20 ENCOUNTER — Ambulatory Visit: Payer: Medicare PPO

## 2023-01-20 DIAGNOSIS — E538 Deficiency of other specified B group vitamins: Secondary | ICD-10-CM | POA: Diagnosis not present

## 2023-01-20 MED ORDER — CYANOCOBALAMIN 1000 MCG/ML IJ SOLN
1000.0000 ug | Freq: Once | INTRAMUSCULAR | Status: AC
  Administered 2023-01-20: 1000 ug via INTRAMUSCULAR

## 2023-01-20 NOTE — Progress Notes (Signed)
Per orders of Dr. Burchette, injection of Cyanocobalamin 1000 mcg given by Valon Glasscock L Zaliah Wissner. Patient tolerated injection well.  

## 2023-01-24 ENCOUNTER — Other Ambulatory Visit: Payer: Self-pay | Admitting: Family Medicine

## 2023-01-27 ENCOUNTER — Ambulatory Visit (INDEPENDENT_AMBULATORY_CARE_PROVIDER_SITE_OTHER): Payer: Medicare PPO | Admitting: *Deleted

## 2023-01-27 DIAGNOSIS — E538 Deficiency of other specified B group vitamins: Secondary | ICD-10-CM

## 2023-01-27 MED ORDER — CYANOCOBALAMIN 1000 MCG/ML IJ SOLN
1000.0000 ug | Freq: Once | INTRAMUSCULAR | Status: AC
  Administered 2023-01-27: 1000 ug via INTRAMUSCULAR

## 2023-01-27 NOTE — Progress Notes (Signed)
Per orders of Dr. Fry, injection of Cyanocobalamin 1000mcg given by Reino Lybbert A. Patient tolerated injection well.  

## 2023-02-03 ENCOUNTER — Ambulatory Visit (INDEPENDENT_AMBULATORY_CARE_PROVIDER_SITE_OTHER): Payer: Self-pay

## 2023-02-03 DIAGNOSIS — E538 Deficiency of other specified B group vitamins: Secondary | ICD-10-CM

## 2023-02-03 MED ORDER — CYANOCOBALAMIN 1000 MCG/ML IJ SOLN
1000.0000 ug | Freq: Once | INTRAMUSCULAR | Status: AC
  Administered 2023-02-03: 1000 ug via INTRAMUSCULAR

## 2023-02-03 NOTE — Progress Notes (Cosign Needed Addendum)
Per orders of Dr. Fry, injection of Cyanocobalamin 1000 mcg given by Lavern Maslow L Jo Booze. °Patient tolerated injection well.  °

## 2023-02-10 ENCOUNTER — Ambulatory Visit: Payer: Self-pay

## 2023-02-10 ENCOUNTER — Encounter: Payer: Self-pay | Admitting: Family Medicine

## 2023-02-10 ENCOUNTER — Ambulatory Visit (INDEPENDENT_AMBULATORY_CARE_PROVIDER_SITE_OTHER): Payer: Medicare HMO | Admitting: Family Medicine

## 2023-02-10 VITALS — BP 102/70 | HR 62 | Temp 97.5°F | Wt 188.0 lb

## 2023-02-10 DIAGNOSIS — E538 Deficiency of other specified B group vitamins: Secondary | ICD-10-CM | POA: Diagnosis not present

## 2023-02-10 NOTE — Progress Notes (Signed)
   Subjective:    Patient ID: Richard Fernandez, male    DOB: Dec 02, 1940, 82 y.o.   MRN: [id number]  HPI Here to discuss his B12 shots. He spoke to Dr. Caryl Never, his PCP, on 01-14-23 about his concerns with his memory. Labs were drawn that day, and his B12 was low at 158. He was started on weekly B12 shots, and he has had 3 shot so far. However he says they make him very sleepy, and he wants to stop them. He says he goes to bed at 10 pm as usual, but he sleeps until noon the next day.    Review of Systems  Constitutional:  Positive for fatigue.  Respiratory: Negative.    Cardiovascular: Negative.   Neurological: Negative.        Objective:   Physical Exam Constitutional:      Appearance: Normal appearance.  Cardiovascular:     Rate and Rhythm: Normal rate and regular rhythm.     Pulses: Normal pulses.     Heart sounds: Normal heart sounds.  Pulmonary:     Effort: Pulmonary effort is normal.     Breath sounds: Normal breath sounds.  Neurological:     General: No focal deficit present.     Mental Status: He is alert and oriented to person, place, and time.           Assessment & Plan:  B12 deficiency. He is convinced the shots make him sleepy, so we will stop them for now. We will check another blood level today (the last shot was 7 days ago). Depending on that result, we may consider switching him to oral meds.  Gershon Crane, MD

## 2023-02-11 LAB — VITAMIN B12: Vitamin B-12: 469 pg/mL (ref 211–911)

## 2023-02-13 ENCOUNTER — Telehealth: Payer: Self-pay | Admitting: Family Medicine

## 2023-02-13 NOTE — Telephone Encounter (Signed)
Requesting a call to discuss lab results from 02/10/23

## 2023-02-13 NOTE — Telephone Encounter (Signed)
Pt is aware dr fry out of the this afternoon

## 2023-02-14 NOTE — Telephone Encounter (Signed)
Pt called to say he was told yesterday that Dr. Clent Ridges would call him back first thing this a.m.  Pt was informed that MD was currently with patients and would call him back at his earliest convenience.   Pt stated this was not okay, and expects a call back from MD this morning.

## 2023-02-17 NOTE — Telephone Encounter (Signed)
I spoke with the patient and informed him of the results. Patient reported that he is feeling Jittery, Nervous and having balance issues. Patient stated this was discussed during his last visit with Dr. Clent Ridges and inquired if there is anything he can do to help with this. Patient reported no SOB, chest pain or pressure at this time.

## 2023-02-17 NOTE — Telephone Encounter (Signed)
Patient calling to check on results

## 2023-02-17 NOTE — Telephone Encounter (Signed)
I am not very familiar with this patient, so I do not know how to explain his symptoms. He can either wait to see Dr. Caryl Never again, or I would be happy to see him again to go into this in more detail

## 2023-02-17 NOTE — Telephone Encounter (Signed)
Patient informed of the message below and has been scheduled for OV

## 2023-02-18 ENCOUNTER — Ambulatory Visit (INDEPENDENT_AMBULATORY_CARE_PROVIDER_SITE_OTHER): Payer: Medicare HMO | Admitting: Family Medicine

## 2023-02-18 ENCOUNTER — Encounter: Payer: Self-pay | Admitting: Family Medicine

## 2023-02-18 VITALS — BP 120/78 | HR 59 | Temp 98.0°F | Wt 185.0 lb

## 2023-02-18 DIAGNOSIS — F319 Bipolar disorder, unspecified: Secondary | ICD-10-CM | POA: Diagnosis not present

## 2023-02-18 DIAGNOSIS — F4310 Post-traumatic stress disorder, unspecified: Secondary | ICD-10-CM

## 2023-02-18 DIAGNOSIS — G4710 Hypersomnia, unspecified: Secondary | ICD-10-CM

## 2023-02-18 NOTE — Progress Notes (Signed)
   Subjective:    Patient ID: Richard Fernandez, male    DOB: 06-20-1941, 82 y.o.   MRN: [id number]  HPI Here to follow up on excessive sleeping. At our last visit, we decided to take him off B12 shots and to start him on B12 pills daily. He thought that this would help him sleep less, but nothing has changed. He goes to bed around 10 pm and he often does not wake up until 10 or 11 the next morning. He says his depression and anxiety are stable.    Review of Systems  Constitutional:  Positive for fatigue.  Respiratory: Negative.    Cardiovascular: Negative.   Neurological: Negative.   Psychiatric/Behavioral:  Negative for dysphoric mood. The patient is not nervous/anxious.        Objective:   Physical Exam Constitutional:      Appearance: Normal appearance.  Cardiovascular:     Rate and Rhythm: Normal rate and regular rhythm.     Pulses: Normal pulses.     Heart sounds: Normal heart sounds.  Pulmonary:     Effort: Pulmonary effort is normal.     Breath sounds: Normal breath sounds.  Neurological:     General: No focal deficit present.     Mental Status: He is alert and oriented to person, place, and time.  Psychiatric:        Mood and Affect: Mood normal.        Behavior: Behavior normal.        Thought Content: Thought content normal.           Assessment & Plan:  I think his excessive sleeping [place] related to his medications. We reviewed these together today, and the one that is most likely to cause sedation is Gabapentin. We agreed to stop this, and we will see how he does over the next few weeks.  Gershon Crane, MD

## 2023-03-04 DIAGNOSIS — H16223 Keratoconjunctivitis sicca, not specified as Sjogren's, bilateral: Secondary | ICD-10-CM | POA: Diagnosis not present

## 2023-03-04 DIAGNOSIS — H2513 Age-related nuclear cataract, bilateral: Secondary | ICD-10-CM | POA: Diagnosis not present

## 2023-03-24 ENCOUNTER — Telehealth: Payer: Self-pay

## 2023-03-24 NOTE — Progress Notes (Signed)
Care Management & Coordination Services Pharmacy Team  Reason for Encounter: General adherence update   Contacted patient for general health update and medication adherence call.  Spoke with patient on 03/24/2023   What concerns do you have about your medications? Patient denies  The patient denies side effects with their medications. Patient denies  How often do you forget or accidentally miss a dose? Patient states Rarely  Are you having any problems getting your medications from your pharmacy? Patient denies  Has the cost of your medications been a concern? Patient denies  Since last visit with PharmD, no interventions have been made.   The patient has not had an ED visit since last contact.   The patient denies problems with their health.   Patient denies concerns or questions for Delano Metz, PharmD at this time. Patient states he is doing fine.    Care Gaps: AWV - completed 08/02/2021 Last BP - 120/78 on 02/18/2023 Shingrix - never done Covid - overdue   Star Rating Drug: Rosuvastatin 40 mg - last filled  03/11/2023 90 DS as [facility]    Chart Updates: Recent office visits:  02/18/2023 Gershon Crane MD - Patient was seen for excessive sleepiness and additional concerns. Discontinued Gabapentin.   02/10/2023 Gershon Crane MD - Patient was seen for B12 deficiency. No medication changes.   01/14/2023 Evelena Peat MD - Patient was seen for memory loss and additional changes. No medication changes.   12/30/2022 Betty [place] MD - Patient was seen for Laceration of left hand without foreign body, initial encounter. Started Doxycycline 100 mg twice daily.   10/26/2023 Betty [place] MD - Patient was seen for nausea without vomiting and dyspepsia. Started Ondansetron 4 mg twice daily prn.   Recent consult visits:  12/26/2022 Ovid Curd DPM - Patient was seen for ingrown toenail and onychomycosis. No medication changes.   Hospital visits:   None  Medications: Outpatient Encounter Medications as of 03/24/2023  Medication Sig   ARIPiprazole (ABILIFY) 2 MG tablet TAKE 1 TABLET(2 MG) BY MOUTH DAILY   busPIRone (BUSPAR) 7.5 MG tablet TAKE 1 TABLET(7.5 MG) BY MOUTH TWICE DAILY   clopidogrel (PLAVIX) 75 MG tablet TAKE 1 TABLET(75 MG) BY MOUTH DAILY   FLUoxetine (PROZAC) 40 MG capsule TAKE 1 CAPSULE(40 MG) BY MOUTH DAILY   Hypromellose (ARTIFICIAL TEARS OP) Place 1 drop into both eyes daily as needed (dry eyes).   rosuvastatin (CRESTOR) 40 MG tablet TAKE 1 TABLET(40 MG) BY MOUTH DAILY   sildenafil (VIAGRA) 50 MG tablet Take 1 tablet (50 mg total) by mouth daily as needed for erectile dysfunction.   No facility-administered encounter medications on file as of 03/24/2023.  Fill History:  Dispensed Days Supply Quantity Provider Pharmacy  aripiprazole 2 mg tablet 03/20/2023 30 30 tablet      Dispensed Days Supply Quantity Provider Pharmacy  BUSPIRONE 7.5MG  TABLETS 03/01/2023 30 60 each      Dispensed Days Supply Quantity Provider Pharmacy  CLOPIDOGREL 75MG  TABLETS 12/23/2022 90 90 each      Dispensed Days Supply Quantity Provider Pharmacy  FLUOXETINE 40MG  CAPSULES 03/11/2023 90 90 each      Dispensed Days Supply Quantity Provider Pharmacy  ROSUVASTATIN 40MG  TABLETS 03/11/2023 90 90 each      Dispensed Days Supply Quantity Provider Pharmacy  SILDENAFIL 50 MG TABLET 12/22/2021 30       Recent vitals BP Readings from Last 3 Encounters:  02/18/23 120/78  02/10/23 102/70  01/14/23 100/62   Pulse Readings from Last 3 Encounters:  02/18/23 (!) 59  02/10/23 62  01/14/23 83   Wt Readings from Last 3 Encounters:  02/18/23 185 lb (83.9 kg)  02/10/23 188 lb (85.3 kg)  01/14/23 197 lb 8 oz (89.6 kg)   BMI Readings from Last 3 Encounters:  02/18/23 29.86 kg/m  02/10/23 30.34 kg/m  01/14/23 31.88 kg/m    Recent lab results    Component Value Date/Time   NA 135 03/13/2022 0926   K 4.0 03/13/2022 0926   CL 100  03/13/2022 0926   CO2 27 03/13/2022 0926   GLUCOSE 90 03/13/2022 0926   BUN 14 03/13/2022 0926   CREATININE 0.73 03/13/2022 0926   CALCIUM 8.8 03/13/2022 0926    Lab Results  Component Value Date   CREATININE 0.73 03/13/2022   GFR 85.67 03/13/2022   GFRNONAA >60 03/11/2020   GFRAA >60 03/11/2020   Lab Results  Component Value Date/Time   HGBA1C (H) 05/20/2010 11:44 PM    5.8 (NOTE)                                                                       According to the ADA Clinical Practice Recommendations for 2011, when HbA1c is used as a screening test:   >=6.5%   Diagnostic of Diabetes Mellitus           (if abnormal result  is confirmed)  5.7-6.4%   Increased risk of developing Diabetes Mellitus  References:Diagnosis and Classification of Diabetes Mellitus,Diabetes Care,2011,34(Suppl 1):S62-S69 and Standards of Medical Care in         Diabetes - 2011,Diabetes Care,2011,34  (Suppl 1):S11-S61.    Lab Results  Component Value Date   CHOL 132 09/30/2022   HDL 47.40 09/30/2022   LDLCALC 70 09/30/2022   TRIG 72.0 09/30/2022   CHOLHDL 3 09/30/2022    Inetta Fermo CMA  Clinical Pharmacist Assistant [phone]

## 2023-04-03 ENCOUNTER — Ambulatory Visit (INDEPENDENT_AMBULATORY_CARE_PROVIDER_SITE_OTHER): Payer: Medicare HMO | Admitting: Podiatry

## 2023-04-03 DIAGNOSIS — L84 Corns and callosities: Secondary | ICD-10-CM

## 2023-04-03 DIAGNOSIS — Z7901 Long term (current) use of anticoagulants: Secondary | ICD-10-CM

## 2023-04-03 DIAGNOSIS — L600 Ingrowing nail: Secondary | ICD-10-CM

## 2023-04-03 DIAGNOSIS — B351 Tinea unguium: Secondary | ICD-10-CM

## 2023-04-03 NOTE — Progress Notes (Signed)
Subjective: Chief Complaint  Patient presents with   Nail Problem   82 year old male presents the office for above concerns.  States he saw his fungus in the nails and he may have an ingrown toenail which is causing discomfort.  No swelling, redness, drainage or any signs of infection.  He is on Plavix  Objective: AAO x3, NAD DP/PT pulses palpable bilaterally, CRT less than 3 seconds There is mild incurvation present of the left fifth digit toenail there is thick hyperkeratotic tissue just adjacent to this.  There is no edema, erythema or signs of infection.  The nails are mildly hypertrophic and dystrophic with yellow, brown discoloration.  No edema, erythema to the toenail sites. No pain with calf compression, swelling, warmth, erythema  Assessment: 82 year old male with symptomatic onychomycosis, skin lesion left fifth toe  Plan: -All treatment options discussed with the patient including all alternatives, risks, complications.  -Sharply debrided nails x 10 without any complications or bleeding -Debrided hyperkeratotic lesions x 1 without any complications or bleeding.  I dispensed offloading pad to help take pressure off of this area and we discussed possible partial nail avulsion removal of the skin lesion.  He will consider his options and consider this next appointment if needed. -Monitor for any clinical signs or symptoms of infection and directed to call the office immediately should any occur or go to the ER. -Patient encouraged to call the office with any questions, concerns, change in symptoms.   Vivi Barrack DPM

## 2023-04-07 ENCOUNTER — Other Ambulatory Visit: Payer: Self-pay | Admitting: Family Medicine

## 2023-04-21 ENCOUNTER — Encounter: Payer: Self-pay | Admitting: Family Medicine

## 2023-04-21 ENCOUNTER — Ambulatory Visit (INDEPENDENT_AMBULATORY_CARE_PROVIDER_SITE_OTHER): Payer: Medicare HMO | Admitting: Family Medicine

## 2023-04-21 VITALS — BP 104/60 | HR 63 | Temp 97.6°F | Ht 66.0 in | Wt 186.1 lb

## 2023-04-21 DIAGNOSIS — G479 Sleep disorder, unspecified: Secondary | ICD-10-CM | POA: Diagnosis not present

## 2023-04-21 DIAGNOSIS — R634 Abnormal weight loss: Secondary | ICD-10-CM | POA: Diagnosis not present

## 2023-04-21 DIAGNOSIS — E538 Deficiency of other specified B group vitamins: Secondary | ICD-10-CM | POA: Diagnosis not present

## 2023-04-21 NOTE — Patient Instructions (Addendum)
Set up labs for tomorrow AM.   Be sure to take B12 1,000 mcg once daily.

## 2023-04-21 NOTE — Progress Notes (Signed)
Established Patient Office Visit  Subjective   Patient ID: Tyrek Ahuna, male    DOB: July 25, 1941  Age: 82 y.o. MRN: [id number]  Chief Complaint  Patient presents with   Medication Consultation    HPI   Khalid has chronic problems including obstructive sleep apnea, obesity, B12 deficiency, chronic bilateral foot drop, bipolar disorder, recurrent depression, hyperlipidemia, posttraumatic stress disorder.  Seen today for the following issues  Weight loss.  This is documented from 215 pounds back in October 23 to 197 pounds back in the spring to current weight of 186 pounds.  He states he has good appetite and eating 3-4 meals per day.  No dietary changes other than perhaps scaling back starchy foods such as potatoes a bit.  Denies any diarrhea.  He has some chronic mild tremors which are unchanged.  No tachycardia.  No abdominal pain.  No headaches.  Denies any current major depression issues and PHQ-9 today is 0.  Recent TSH normal.  His weight does seem to have leveled out though over the past couple months  Recent B12 deficiency.  He had level of 158 and repeat 469.  Not consistently taking oral replacement.  He states he is also sleeping "too much ".  Usually goes to bed around 9 but stays awake watching TV to around 10:30 or 11 but then falls asleep and usually sleeps about 9 the next morning.  Sometimes has to get his dog out at night.  Denies significant daytime napping.  He is not taking any sedative medications other than does take gabapentin but has been on this for years for neuropathy symptoms and low dosage.  No sedating medications at night.  No alcohol.  Past Medical History:  Diagnosis Date   CEREBROVASCULAR ACCIDENT, HX OF 11/29/2010   DEPRESSION 11/29/2010   Hearing loss    HYPERLIPIDEMIA 11/29/2010   OBSTRUCTIVE SLEEP APNEA 11/29/2010   CPAP   PTSD (post-traumatic stress disorder)    Stroke [facility])    Past Surgical History:  Procedure Laterality Date   BACK SURGERY   1962   HAND SURGERY     KNEE SURGERY     SHOULDER ARTHROSCOPY WITH ROTATOR CUFF REPAIR      reports that he quit smoking about 34 years ago. His smoking use included cigarettes. He has a 30.00 pack-year smoking history. He has never used smokeless tobacco. He reports that he does not drink alcohol and does not use drugs. family history includes Breast cancer in his mother; Heart failure in his father. He was adopted. No Known Allergies   Review of Systems  Constitutional:  Positive for weight loss. Negative for chills and fever.  Respiratory:  Negative for cough and shortness of breath.   Cardiovascular:  Negative for chest pain.  Gastrointestinal:  Negative for abdominal pain, blood in stool, diarrhea and melena.  Genitourinary:  Negative for dysuria.  Psychiatric/Behavioral:  Negative for depression. The patient has insomnia.       Objective:     BP 104/60 (BP Location: Left Arm, Patient Position: Sitting, Cuff Size: Normal)   Pulse 63   Temp 97.6 F (36.4 C) (Oral)   Ht 5\' 6"  (1.676 m)   Wt 186 lb 1.6 oz (84.4 kg)   SpO2 96%   BMI 30.04 kg/m  BP Readings from Last 3 Encounters:  04/21/23 104/60  02/18/23 120/78  02/10/23 102/70   Wt Readings from Last 3 Encounters:  04/21/23 186 lb 1.6 oz (84.4 kg)  02/18/23 185 lb (83.9  kg)  02/10/23 188 lb (85.3 kg)      Physical Exam Vitals reviewed.  Constitutional:      General: He is not in acute distress.    Appearance: Normal appearance.  HENT:     Mouth/Throat:     Mouth: Mucous membranes are moist.     Pharynx: Oropharynx is clear.  Cardiovascular:     Rate and Rhythm: Normal rate and regular rhythm.  Pulmonary:     Effort: Pulmonary effort is normal.     Breath sounds: Normal breath sounds. No wheezing or rales.  Abdominal:     Palpations: Abdomen is soft. There is no mass.     Tenderness: There is no abdominal tenderness.  Musculoskeletal:     Cervical back: Neck supple.     Right lower leg: No edema.      Left lower leg: No edema.  Lymphadenopathy:     Cervical: No cervical adenopathy.  Neurological:     Mental Status: He is alert.      No results found for any visits on 04/21/23.  Last CBC Lab Results  Component Value Date   WBC 3.5 (L) 03/13/2022   HGB 13.9 03/13/2022   HCT 40.6 03/13/2022   MCV 100.9 (H) 03/13/2022   MCH 35.4 (H) 03/11/2020   RDW 13.8 03/13/2022   PLT 180.0 03/13/2022   Last metabolic panel Lab Results  Component Value Date   GLUCOSE 90 03/13/2022   NA 135 03/13/2022   K 4.0 03/13/2022   CL 100 03/13/2022   CO2 27 03/13/2022   BUN 14 03/13/2022   CREATININE 0.73 03/13/2022   GFRNONAA >60 03/11/2020   CALCIUM 8.8 03/13/2022   PROT 6.8 09/30/2022   ALBUMIN 3.9 09/30/2022   BILITOT 0.4 09/30/2022   ALKPHOS 61 09/30/2022   AST 19 09/30/2022   ALT 19 09/30/2022   ANIONGAP 10 03/11/2020   Last hemoglobin A1c Lab Results  Component Value Date   HGBA1C (H) 05/20/2010    5.8 (NOTE)                                                                       According to the ADA Clinical Practice Recommendations for 2011, when HbA1c is used as a screening test:   >=6.5%   Diagnostic of Diabetes Mellitus           (if abnormal result  is confirmed)  5.7-6.4%   Increased risk of developing Diabetes Mellitus  References:Diagnosis and Classification of Diabetes Mellitus,Diabetes Care,2011,34(Suppl 1):S62-S69 and Standards of Medical Care in         Diabetes - 2011,Diabetes Care,2011,34  (Suppl 1):S11-S61.   Last thyroid functions Lab Results  Component Value Date   TSH 2.19 01/14/2023   Last vitamin B12 and Folate Lab Results  Component Value Date   VITAMINB12 469 02/10/2023      The ASCVD Risk score (Arnett DK, et al., 2019) failed to calculate for the following reasons:   The 2019 ASCVD risk score is only valid for ages 71 to 30   The patient has a prior MI or stroke diagnosis    Assessment & Plan:   #1 weight loss.  Has had substantial weight  loss since last fall but seems  to have leveled out recently.  He has good appetite which is a good sign.  No recent medication changes.  Has scaled back somewhat with starches.  For like the degree of weight loss he is had thus far is healthy as long as no underlying cause found.  Recent TSH normal. -Check further labs with CBC, CMP, sed rate, C-reactive protein -Set up follow-up in 2 to 3 months to reassess -Consider food diary  #2 B12 deficiency.  Improved on follow-up.  Continue over-the-counter B12 1000 mcg daily  #3 sleep disturbance.  Discussed sleep hygiene.  Avoid bright lights such as TV at night.  Consider more structured time for getting up each morning.   Return in about 3 months (around 07/22/2023).    Evelena Peat, MD

## 2023-04-22 ENCOUNTER — Other Ambulatory Visit: Payer: Medicare HMO

## 2023-04-24 ENCOUNTER — Other Ambulatory Visit (INDEPENDENT_AMBULATORY_CARE_PROVIDER_SITE_OTHER): Payer: Medicare HMO

## 2023-04-24 DIAGNOSIS — R634 Abnormal weight loss: Secondary | ICD-10-CM

## 2023-04-24 LAB — CBC WITH DIFFERENTIAL/PLATELET
Basophils Absolute: 0 10*3/uL (ref 0.0–0.1)
Basophils Relative: 1 % (ref 0.0–3.0)
Eosinophils Absolute: 0 10*3/uL (ref 0.0–0.7)
Eosinophils Relative: 0.7 % (ref 0.0–5.0)
HCT: 40.7 % (ref 39.0–52.0)
Hemoglobin: 13.5 g/dL (ref 13.0–17.0)
Lymphocytes Relative: 23.8 % (ref 12.0–46.0)
Lymphs Abs: 0.7 10*3/uL (ref 0.7–4.0)
MCHC: 33.2 g/dL (ref 30.0–36.0)
MCV: 100.3 fl — ABNORMAL HIGH (ref 78.0–100.0)
Monocytes Absolute: 0.2 10*3/uL (ref 0.1–1.0)
Monocytes Relative: 7.7 % (ref 3.0–12.0)
Neutro Abs: 1.9 10*3/uL (ref 1.4–7.7)
Neutrophils Relative %: 66.8 % (ref 43.0–77.0)
Platelets: 171 10*3/uL (ref 150.0–400.0)
RBC: 4.06 Mil/uL — ABNORMAL LOW (ref 4.22–5.81)
RDW: 14.3 % (ref 11.5–15.5)
WBC: 2.9 10*3/uL — ABNORMAL LOW (ref 4.0–10.5)

## 2023-04-24 LAB — COMPREHENSIVE METABOLIC PANEL
ALT: 20 U/L (ref 0–53)
AST: 21 U/L (ref 0–37)
Albumin: 3.8 g/dL (ref 3.5–5.2)
Alkaline Phosphatase: 58 U/L (ref 39–117)
BUN: 11 mg/dL (ref 6–23)
CO2: 28 mEq/L (ref 19–32)
Calcium: 8.5 mg/dL (ref 8.4–10.5)
Chloride: 94 mEq/L — ABNORMAL LOW (ref 96–112)
Creatinine, Ser: 0.69 mg/dL (ref 0.40–1.50)
GFR: 86.47 mL/min (ref >60.00)
Glucose, Bld: 132 mg/dL — ABNORMAL HIGH (ref 70–99)
Potassium: 3.8 mEq/L (ref 3.5–5.1)
Sodium: 131 mEq/L — ABNORMAL LOW (ref 135–145)
Total Bilirubin: 0.6 mg/dL (ref 0.2–1.2)
Total Protein: 6.8 g/dL (ref 6.0–8.3)

## 2023-04-24 LAB — C-REACTIVE PROTEIN: CRP: <1 mg/dL (ref 0.5–20.0)

## 2023-04-24 LAB — SEDIMENTATION RATE: Sed Rate: 27 mm/hr — ABNORMAL HIGH (ref 0–20)

## 2023-04-28 ENCOUNTER — Other Ambulatory Visit: Payer: Self-pay | Admitting: Family Medicine

## 2023-05-09 ENCOUNTER — Other Ambulatory Visit: Payer: Self-pay | Admitting: Family Medicine

## 2023-05-09 DIAGNOSIS — E7849 Other hyperlipidemia: Secondary | ICD-10-CM

## 2023-06-18 ENCOUNTER — Other Ambulatory Visit: Payer: Self-pay | Admitting: Family Medicine

## 2023-06-28 ENCOUNTER — Other Ambulatory Visit: Payer: Self-pay | Admitting: Family Medicine

## 2023-07-01 ENCOUNTER — Encounter: Payer: Self-pay | Admitting: Podiatry

## 2023-07-01 ENCOUNTER — Ambulatory Visit (INDEPENDENT_AMBULATORY_CARE_PROVIDER_SITE_OTHER): Payer: Medicare HMO | Admitting: Podiatry

## 2023-07-01 DIAGNOSIS — L84 Corns and callosities: Secondary | ICD-10-CM | POA: Diagnosis not present

## 2023-07-01 DIAGNOSIS — Z7901 Long term (current) use of anticoagulants: Secondary | ICD-10-CM | POA: Diagnosis not present

## 2023-07-01 DIAGNOSIS — B351 Tinea unguium: Secondary | ICD-10-CM

## 2023-07-01 NOTE — Patient Instructions (Signed)
Keep a small amount of antibiotic ointment and bandage on the left 5th toe.  Monitor for any signs/symptoms of infection. Call the office immediately if any occur or go directly to the emergency room. Call with any questions/concerns.

## 2023-07-02 NOTE — Progress Notes (Signed)
Subjective: Chief Complaint  Patient presents with   Ingrown Toenail    RM14: Patient is here for a follow up for chronic conditions and ingrown toenail     82 year old male presents the office for above concerns.  He states the toes are doing better.  He presents to have the nails trimmed as are still thickened elongated and cause discomfort but no significant pain on the area ingrown toenail.  No drainage or pus.  He is on Plavix  Objective: AAO x3, NAD DP/PT pulses palpable bilaterally, CRT less than 3 seconds Nails are hypertrophic, dystrophic with yellow, brown discoloration most notably on the hallux.  No edema, erythema to the toenail sites.  Tenderness nails 1-5 bilaterally.  Hyperkeratotic lesion along the lateral aspect left fifth toe and there is blood underneath this area.  Upon debridement minimal bleeding but there is no definitive ulceration but is preulcerative. No pain with calf compression, swelling, warmth, erythema  Assessment: 82 year old male with symptomatic onychomycosis, skin lesion left fifth toe  Plan: -All treatment options discussed with the patient including all alternatives, risks, complications.  -Sharply debrided nails x 10 without any complications or bleeding -Debrided hyperkeratotic lesions x 1.  Minimal bleeding did occur. Antibiotic ointment dressings were applied.  Discussed daily dressing changes.  Monitor for any signs or symptoms of infection and call me know immediately should any occur.  Discussed shoe modifications avoid excess pressure.   Return in about 3 months (around 10/01/2023).  Vivi Barrack DPM

## 2023-08-06 ENCOUNTER — Encounter: Payer: Self-pay | Admitting: Family Medicine

## 2023-08-06 ENCOUNTER — Telehealth: Payer: Medicare HMO | Admitting: Family Medicine

## 2023-08-06 VITALS — Ht 66.0 in

## 2023-08-06 DIAGNOSIS — R519 Headache, unspecified: Secondary | ICD-10-CM | POA: Diagnosis not present

## 2023-08-06 DIAGNOSIS — R110 Nausea: Secondary | ICD-10-CM | POA: Diagnosis not present

## 2023-08-06 MED ORDER — ONDANSETRON HCL 4 MG PO TABS: 4.0000 mg | ORAL_TABLET | Freq: Two times a day (BID) | ORAL | 0 refills | Status: AC | PRN

## 2023-08-06 NOTE — Progress Notes (Signed)
Virtual Visit via Video Note I connected with Richard Fernandez on 08/06/23 by a video enabled telemedicine application and verified that I am speaking with the correct person using two identifiers. Location patient: home Location provider:work office Persons participating in the virtual visit: patient, scriber, wife, provider  I discussed the limitations of evaluation and management by telemedicine and the availability of in person appointments. The patient expressed understanding and agreed to proceed.  Chief Complaint  Patient presents with   Nausea   Fatigue    No fever, no cough; not around anyone that's been sick.    Headache    X 2 days   HPI: Mr. Richard Fernandez is an 82 y.o. male with a PMHx significant for OSA, HLD, PTSD, and Bipolar I disorder who is complaining of fatigue, nausea,and headache for two days.  He describes his headache as an ache, and localizes it to the front of his forehead.  Headache is mild. No associated visual changes or focal weakness. No hx of headaches. He says he took some OTC headache pills.  He has taken Philips' Milk of Magnesia for his nausea. He endorses decreased appetite.  He denies fever,chills, nasal congestion, rhinorrhea,sore throat, cough, wheezing, SOB, CP, abdominal pain, vomiting, diarrhea, urinary symptoms, heartburn, belching, or new medications.  No known sick contact or recent travel.  He has not taken a COVID test.   ROS: See pertinent positives and negatives per HPI.  Past Medical History:  Diagnosis Date   CEREBROVASCULAR ACCIDENT, HX OF 11/29/2010   DEPRESSION 11/29/2010   Hearing loss    HYPERLIPIDEMIA 11/29/2010   OBSTRUCTIVE SLEEP APNEA 11/29/2010   CPAP   PTSD (post-traumatic stress disorder)    Stroke [facility])     Past Surgical History:  Procedure Laterality Date   BACK SURGERY  1962   HAND SURGERY     KNEE SURGERY     SHOULDER ARTHROSCOPY WITH ROTATOR CUFF REPAIR     Family History  Adopted: Yes  Problem Relation  Age of Onset   Breast cancer Mother    Heart failure Father    Social History   Socioeconomic History   Marital status: Married    Spouse name: Verlon Au    Number of children: 1   Years of education: college   Highest education level: Not on file  Occupational History   Occupation: part time counselor   Tobacco Use   Smoking status: Former    Current packs/day: 0.00    Average packs/day: 1.5 packs/day for 20.0 years (30.0 ttl pk-yrs)    Types: Cigarettes    Start date: 11/04/1968    Quit date: 11/04/1988    Years since quitting: 34.7   Smokeless tobacco: Never  Vaping Use   Vaping status: Never Used  Substance and Sexual Activity   Alcohol use: No    Alcohol/week: 0.0 standard drinks of alcohol   Drug use: No   Sexual activity: Not on file  Other Topics Concern   Not on file  Social History Narrative   Pts is adopted. Patient lives at home with his Verlon Au.   Retired but still working some.   Education some college.   Left handed.   Caffeine four cups of coffee daily.      Social Determinants of Health   Financial Resource Strain: Low Risk  (09/17/2022)   Overall Financial Resource Strain (CARDIA)    Difficulty of Paying Living Expenses: Not very hard  Food Insecurity: No Food Insecurity (08/02/2021)   Hunger Vital  Sign    Worried About Programme researcher, broadcasting/film/video in the Last Year: Never true    Ran Out of Food in the Last Year: Never true  Transportation Needs: No Transportation Needs (05/14/2021)   PRAPARE - Administrator, Civil Service (Medical): No    Lack of Transportation (Non-Medical): No  Physical Activity: Sufficiently Active (08/02/2021)   Exercise Vital Sign    Days of Exercise per Week: 5 days    Minutes of Exercise per Session: 30 min  Stress: No Stress Concern Present (08/02/2021)   [employer] of Occupational Health - Occupational Stress Questionnaire    Feeling of Stress : Not at all  Social Connections: Moderately Integrated (08/02/2021)    Social Connection and Isolation Panel [NHANES]    Frequency of Communication with Friends and Family: Three times a week    Frequency of Social Gatherings with Friends and Family: Three times a week    Attends Religious Services: More than 4 times per year    Active Member of Clubs or Organizations: No    Attends Banker Meetings: Never    Marital Status: Married  Catering manager Violence: Not At Risk (08/02/2021)   Humiliation, Afraid, Rape, and Kick questionnaire    Fear of Current or Ex-Partner: No    Emotionally Abused: No    Physically Abused: No    Sexually Abused: No     Current Outpatient Medications:    ARIPiprazole (ABILIFY) 2 MG tablet, TAKE 1 TABLET(2 MG) BY MOUTH DAILY, Disp: 30 tablet, Rfl: 5   busPIRone (BUSPAR) 7.5 MG tablet, TAKE 1 TABLET(7.5 MG) BY MOUTH TWICE DAILY, Disp: 60 tablet, Rfl: 5   clopidogrel (PLAVIX) 75 MG tablet, TAKE 1 TABLET(75 MG) BY MOUTH DAILY, Disp: 90 tablet, Rfl: 1   FLUoxetine (PROZAC) 40 MG capsule, TAKE 1 CAPSULE(40 MG) BY MOUTH DAILY, Disp: 90 capsule, Rfl: 0   Hypromellose (ARTIFICIAL TEARS OP), Place 1 drop into both eyes daily as needed (dry eyes)., Disp: , Rfl:    ondansetron (ZOFRAN) 4 MG tablet, Take 1 tablet (4 mg total) by mouth every 12 (twelve) hours as needed for up to 5 days for nausea or vomiting., Disp: 10 tablet, Rfl: 0   rosuvastatin (CRESTOR) 40 MG tablet, TAKE 1 TABLET(40 MG) BY MOUTH DAILY, Disp: 90 tablet, Rfl: 1   sildenafil (VIAGRA) 50 MG tablet, Take 1 tablet (50 mg total) by mouth daily as needed for erectile dysfunction., Disp: 10 tablet, Rfl: 11  EXAM:  VITALS per patient if applicable:Ht 5\' 6"  (1.676 m)   BMI 30.04 kg/m   GENERAL: alert, oriented, appears well and in no acute distress  HEENT: atraumatic, conjunctiva clear, no obvious abnormalities on inspection of external nose and ears  NECK: normal movements of the head and neck  LUNGS: on inspection no signs of respiratory distress, breathing  rate appears normal, no obvious gross SOB, gasping or wheezing  CV: no obvious cyanosis  MS: moves all visible extremities without noticeable abnormality  PSYCH/NEURO: pleasant and cooperative, no obvious depression or anxiety, speech and thought processing grossly intact  ASSESSMENT AND PLAN:  Discussed the following assessment and plan:   Nausea without vomiting Monitor for new GI symptoms. It seems to be part of a viral synd. Recommend adequate hydration. Zofran 4 mg bid prn.  -     Ondansetron HCl; Take 1 tablet (4 mg total) by mouth every 12 (twelve) hours as needed for up to 5 days for nausea or vomiting.  Dispense: 10 tablet; Refill: 0  Frontal headache We discussed possible etiologies. I do not think he needs to go to the ER at this time. Instructed to monitor for new symptoms. Recommend taking acetaminophen 500 mg 2-3 times per day as needed. Plenty of p.o. fluids. Clearly instructed about warning signs.  Re-connect with patient, COVID test performed at home and it was negative (5:00 pm).  We discussed possible serious and likely etiologies, options for evaluation and workup, limitations of telemedicine visit vs in person visit, treatment, treatment risks and precautions. The patient was advised to call back or seek an in-person evaluation if the symptoms worsen or if the condition fails to improve as anticipated. I discussed the assessment and treatment plan with the patient. The patient was provided an opportunity to ask questions and all were answered. The patient agreed with the plan and demonstrated an understanding of the instructions.  I, Rolla Etienne Wierda, acting as a scribe for Ladell Lea [place], MD., have documented all relevant documentation on the behalf of Franki Stemen [place], MD, as directed by  Johnisha Louks [place], MD while in the presence of Kylar Leonhardt [place], MD.   I, Latiesha Harada [place], MD, have reviewed all documentation for this visit. The documentation on 08/06/23 for the exam,  diagnosis, procedures, and orders are all accurate and complete.  Return if symptoms worsen or fail to improve.  Emika Tiano [place], MD

## 2023-08-11 ENCOUNTER — Ambulatory Visit (INDEPENDENT_AMBULATORY_CARE_PROVIDER_SITE_OTHER): Payer: Medicare HMO

## 2023-08-11 ENCOUNTER — Ambulatory Visit (INDEPENDENT_AMBULATORY_CARE_PROVIDER_SITE_OTHER): Payer: Medicare HMO | Admitting: Family Medicine

## 2023-08-11 VITALS — BP 100/68 | HR 58 | Temp 97.7°F | Ht 66.0 in | Wt 176.9 lb

## 2023-08-11 DIAGNOSIS — R634 Abnormal weight loss: Secondary | ICD-10-CM

## 2023-08-11 DIAGNOSIS — D72819 Decreased white blood cell count, unspecified: Secondary | ICD-10-CM | POA: Diagnosis not present

## 2023-08-11 DIAGNOSIS — R221 Localized swelling, mass and lump, neck: Secondary | ICD-10-CM

## 2023-08-11 DIAGNOSIS — E871 Hypo-osmolality and hyponatremia: Secondary | ICD-10-CM

## 2023-08-11 DIAGNOSIS — M799 Soft tissue disorder, unspecified: Secondary | ICD-10-CM | POA: Diagnosis not present

## 2023-08-11 DIAGNOSIS — Z23 Encounter for immunization: Secondary | ICD-10-CM | POA: Diagnosis not present

## 2023-08-11 DIAGNOSIS — I771 Stricture of artery: Secondary | ICD-10-CM | POA: Diagnosis not present

## 2023-08-11 LAB — CBC WITH DIFFERENTIAL/PLATELET
Basophils Absolute: 0 10*3/uL (ref 0.0–0.1)
Basophils Relative: 1.1 % (ref 0.0–3.0)
Eosinophils Absolute: 0 10*3/uL (ref 0.0–0.7)
Eosinophils Relative: 0.7 % (ref 0.0–5.0)
HCT: 41.2 % (ref 39.0–52.0)
Hemoglobin: 13.6 g/dL (ref 13.0–17.0)
Lymphocytes Relative: 25.9 % (ref 12.0–46.0)
Lymphs Abs: 0.8 10*3/uL (ref 0.7–4.0)
MCHC: 33.1 g/dL (ref 30.0–36.0)
MCV: 102.1 fL — ABNORMAL HIGH (ref 78.0–100.0)
Monocytes Absolute: 0.3 10*3/uL (ref 0.1–1.0)
Monocytes Relative: 9.4 % (ref 3.0–12.0)
Neutro Abs: 2 10*3/uL (ref 1.4–7.7)
Neutrophils Relative %: 62.9 % (ref 43.0–77.0)
Platelets: 200 10*3/uL (ref 150.0–400.0)
RBC: 4.04 Mil/uL — ABNORMAL LOW (ref 4.22–5.81)
RDW: 14.9 % (ref 11.5–15.5)
WBC: 3.2 10*3/uL — ABNORMAL LOW (ref 4.0–10.5)

## 2023-08-11 LAB — TSH: TSH: 4.06 u[IU]/mL (ref 0.35–5.50)

## 2023-08-11 LAB — BASIC METABOLIC PANEL
BUN: 10 mg/dL (ref 6–23)
CO2: 29 meq/L (ref 19–32)
Calcium: 8.9 mg/dL (ref 8.4–10.5)
Chloride: 97 meq/L (ref 96–112)
Creatinine, Ser: 0.66 mg/dL (ref 0.40–1.50)
GFR: 87.45 mL/min (ref >60.00)
Glucose, Bld: 99 mg/dL (ref 70–99)
Potassium: 3.9 meq/L (ref 3.5–5.1)
Sodium: 136 meq/L (ref 135–145)

## 2023-08-11 NOTE — Patient Instructions (Signed)
I will be setting up CT scan of abdomen if tests today are normal.

## 2023-08-11 NOTE — Progress Notes (Signed)
Established Patient Office Visit  Subjective   Patient ID: Richard Fernandez, male    DOB: 01-Jun-1941  Age: 82 y.o. MRN: [id number]  Chief Complaint  Patient presents with   Follow-up    Pt is here with wife and service dog to follow up on certain on losing weight.     HPI   Richard Fernandez has multiple chronic problems including history of obstructive sleep apnea, bipolar disorder, recurrent depression, history of CVA, hyperlipidemia, cognitive impairment, hyperlipidemia.  He has been gradually losing weight over several months.  Going back about a year and a half ago his weight is down almost 50 pounds.  He states he is eating about the same as usual and his wife concurs with this.  He relates good appetite.  Labs last visit only significant for nonspecific findings of sodium 131 and chronic stable low white count.  He denies any headaches, cough, abdominal pain, chronic diarrhea, rash, lymphadenopathy, night sweats.  He feels like his depression is relatively stable.  Quit smoking around 1990.  No recent chronic cough. Denies any recent dietary changes.  Past Medical History:  Diagnosis Date   CEREBROVASCULAR ACCIDENT, HX OF 11/29/2010   DEPRESSION 11/29/2010   Hearing loss    HYPERLIPIDEMIA 11/29/2010   OBSTRUCTIVE SLEEP APNEA 11/29/2010   CPAP   PTSD (post-traumatic stress disorder)    Stroke [facility])    Past Surgical History:  Procedure Laterality Date   BACK SURGERY  1962   HAND SURGERY     KNEE SURGERY     SHOULDER ARTHROSCOPY WITH ROTATOR CUFF REPAIR      reports that he quit smoking about 34 years ago. His smoking use included cigarettes. He started smoking about 54 years ago. He has a 30 pack-year smoking history. He has never used smokeless tobacco. He reports that he does not drink alcohol and does not use drugs. family history includes Breast cancer in his mother; Heart failure in his father. He was adopted. No Known Allergies  Review of Systems  Constitutional:  Positive for  weight loss. Negative for chills and fever.  Respiratory:  Negative for cough and shortness of breath.   Cardiovascular:  Negative for chest pain.  Gastrointestinal:  Negative for abdominal pain, blood in stool, diarrhea and melena.  Genitourinary:  Negative for hematuria.  Neurological:  Negative for headaches.  Psychiatric/Behavioral:  Negative for suicidal ideas.       Objective:     BP 100/68 (BP Location: Left Arm, Patient Position: Sitting, Cuff Size: Normal)   Pulse (!) 58   Temp 97.7 F (36.5 C) (Oral)   Ht 5\' 6"  (1.676 m)   Wt 176 lb 14.4 oz (80.2 kg)   SpO2 96%   BMI 28.55 kg/m  BP Readings from Last 3 Encounters:  08/11/23 100/68  04/21/23 104/60  02/18/23 120/78   Wt Readings from Last 3 Encounters:  08/11/23 176 lb 14.4 oz (80.2 kg)  04/21/23 186 lb 1.6 oz (84.4 kg)  02/18/23 185 lb (83.9 kg)      Physical Exam Vitals reviewed.  Constitutional:      General: He is not in acute distress.    Appearance: He is not ill-appearing.  HENT:     Head: Normocephalic and atraumatic.     Mouth/Throat:     Mouth: Mucous membranes are moist.     Pharynx: Oropharynx is clear.  Cardiovascular:     Rate and Rhythm: Normal rate and regular rhythm.  Pulmonary:     Effort:  Pulmonary effort is normal.     Breath sounds: Normal breath sounds. No wheezing or rales.  Abdominal:     Palpations: Abdomen is soft. There is no mass.     Tenderness: There is no abdominal tenderness.  Musculoskeletal:     Cervical back: Neck supple.  Lymphadenopathy:     Cervical: No cervical adenopathy.  Neurological:     General: No focal deficit present.     Mental Status: He is alert.     Comments: Tremor involving left upper extremity which is chronic  Psychiatric:        Mood and Affect: Mood normal.      No results found for any visits on 08/11/23.  Last CBC Lab Results  Component Value Date   WBC 2.9 (L) 04/24/2023   HGB 13.5 04/24/2023   HCT 40.7 04/24/2023   MCV 100.3  (H) 04/24/2023   MCH 35.4 (H) 03/11/2020   RDW 14.3 04/24/2023   PLT 171.0 04/24/2023   Last metabolic panel Lab Results  Component Value Date   GLUCOSE 132 (H) 04/24/2023   NA 131 (L) 04/24/2023   K 3.8 04/24/2023   CL 94 (L) 04/24/2023   CO2 28 04/24/2023   BUN 11 04/24/2023   CREATININE 0.69 04/24/2023   GFR 86.47 04/24/2023   CALCIUM 8.5 04/24/2023   PROT 6.8 04/24/2023   ALBUMIN 3.8 04/24/2023   BILITOT 0.6 04/24/2023   ALKPHOS 58 04/24/2023   AST 21 04/24/2023   ALT 20 04/24/2023   ANIONGAP 10 03/11/2020   Last lipids Lab Results  Component Value Date   CHOL 132 09/30/2022   HDL 47.40 09/30/2022   LDLCALC 70 09/30/2022   TRIG 72.0 09/30/2022   CHOLHDL 3 09/30/2022   Last thyroid functions Lab Results  Component Value Date   TSH 2.19 01/14/2023      The ASCVD Risk score (Arnett DK, et al., 2019) failed to calculate for the following reasons:   The 2019 ASCVD risk score is only valid for ages 60 to 58   The patient has a prior MI or stroke diagnosis    Assessment & Plan:   Problem List Items Addressed This Visit   None Visit Diagnoses     Weight loss    -  Primary   Relevant Orders   CBC with Differential/Platelet   Basic metabolic panel   TSH   DG Chest 2 View   Influenza vaccine needed       Relevant Orders   Flu Vaccine Trivalent High Dose (Fluad)     Patient presents with steady progressive weight loss over several months.  Etiology unclear.  Interestingly, he reports good appetite and states he is eating regularly and he thinks is same as he always has.  Nonfocal exam.  Does have a longstanding history of recurrent depression but does not appear to be acutely depressed.  Did have recent mild hyponatremia of uncertain significance and leukopenia but stable and chronic  -Obtain PA and lateral chest x-ray -Check TSH, CBC, CMP -If above unremarkable consider CT abdomen pelvis to further assess -Set up 88-month follow-up  Return in about 3  months (around 11/11/2023).    Evelena Peat, MD

## 2023-08-11 NOTE — Addendum Note (Signed)
Addended by: Vickii Chafe on: 08/11/2023 05:01 PM   Modules accepted: Orders

## 2023-08-25 ENCOUNTER — Other Ambulatory Visit: Payer: Self-pay | Admitting: Family Medicine

## 2023-09-02 ENCOUNTER — Telehealth: Payer: Self-pay

## 2023-09-02 NOTE — Telephone Encounter (Signed)
I received a call report from Loyal at [facility] and she wanted to make sure PCP received Chest x-ray and was notified of Impression 2 on report.

## 2023-09-02 NOTE — Progress Notes (Unsigned)
?   Of neck mass noted on frontal CXR view.  In view of recent weight loss, setting up CT neck with contrast to further assess.  Kristian Covey MD [facility] at [facility]

## 2023-09-03 NOTE — Telephone Encounter (Signed)
Please see result note 

## 2023-09-03 NOTE — Telephone Encounter (Signed)
Pt states he received a phone call and believes it was from CMA.  Pt says he could not quite make out what the call was about and would like CMA to call him back.

## 2023-09-09 NOTE — Telephone Encounter (Signed)
Pt is calling back and want you to give him a call

## 2023-09-09 NOTE — Telephone Encounter (Signed)
I spoke with the patient and he inquired about his CT scan of neck which was ordered by PCP on 10/29. Patient reported he still has not been contacted and I informed him that we will reach out to our referral coordinator to see about getting this scheduled.

## 2023-09-15 NOTE — Telephone Encounter (Signed)
Order and referral was placed in wrong workqueue.   I was able to get patient scheduled for 2:30pm at [facility] on 11/18.  Patient is to go to entrance A to check in for radiology.  Contacted patient and he wasn't near anything where he could write appointment information down. Patient will be calling the office back for information.  Please advise.

## 2023-09-15 NOTE — Telephone Encounter (Signed)
I spoke with the patient and he was provided with information below and voiced understanding.

## 2023-09-22 ENCOUNTER — Ambulatory Visit (HOSPITAL_COMMUNITY)
Admission: RE | Admit: 2023-09-22 | Discharge: 2023-09-22 | Disposition: A | Discharge status: Home / Self Care | Payer: Medicare HMO | Source: Ambulatory Visit | Attending: Family Medicine | Admitting: Family Medicine

## 2023-09-22 DIAGNOSIS — I672 Cerebral atherosclerosis: Secondary | ICD-10-CM | POA: Diagnosis not present

## 2023-09-22 DIAGNOSIS — R221 Localized swelling, mass and lump, neck: Secondary | ICD-10-CM | POA: Insufficient documentation

## 2023-09-22 DIAGNOSIS — I700 Atherosclerosis of aorta: Secondary | ICD-10-CM | POA: Diagnosis not present

## 2023-09-22 DIAGNOSIS — R634 Abnormal weight loss: Secondary | ICD-10-CM | POA: Insufficient documentation

## 2023-09-22 MED ORDER — IOHEXOL 350 MG/ML SOLN
70.0000 mL | Freq: Once | INTRAVENOUS | Status: AC | PRN
  Administered 2023-09-22: 70 mL via INTRAVENOUS

## 2023-09-29 ENCOUNTER — Telehealth: Payer: Self-pay | Admitting: Family Medicine

## 2023-09-29 NOTE — Telephone Encounter (Signed)
Pt can't understand why he hasn't gotten his radiology results. Advised that there has been a delay in results coming in. Still requests a call

## 2023-09-30 ENCOUNTER — Ambulatory Visit: Payer: Medicare HMO | Admitting: Podiatry

## 2023-10-13 ENCOUNTER — Telehealth: Payer: Self-pay | Admitting: Family Medicine

## 2023-10-13 NOTE — Telephone Encounter (Signed)
Please see result note 

## 2023-10-13 NOTE — Telephone Encounter (Signed)
Pt returning call for results

## 2023-10-27 ENCOUNTER — Encounter: Payer: Self-pay | Admitting: Family Medicine

## 2023-10-27 ENCOUNTER — Ambulatory Visit (INDEPENDENT_AMBULATORY_CARE_PROVIDER_SITE_OTHER): Payer: Medicare HMO | Admitting: Family Medicine

## 2023-10-27 VITALS — BP 120/74 | HR 67 | Temp 97.6°F | Ht 66.0 in | Wt 188.6 lb

## 2023-10-27 DIAGNOSIS — R9389 Abnormal findings on diagnostic imaging of other specified body structures: Secondary | ICD-10-CM

## 2023-10-27 DIAGNOSIS — F339 Major depressive disorder, recurrent, unspecified: Secondary | ICD-10-CM

## 2023-10-27 DIAGNOSIS — R634 Abnormal weight loss: Secondary | ICD-10-CM | POA: Diagnosis not present

## 2023-10-27 MED ORDER — ARIPIPRAZOLE 5 MG PO TABS: 5.0000 mg | ORAL_TABLET | Freq: Every day | ORAL | 2 refills | Status: DC

## 2023-10-27 NOTE — Progress Notes (Signed)
Established Patient Office Visit  Subjective   Patient ID: Richard Fernandez, male    DOB: 09-21-1941  Age: 82 y.o. MRN: [id number]  Chief Complaint  Patient presents with   Results    HPI   Richard Fernandez is seen to follow-up to go over recent CT neck.  He was seen here October 7 with significant documented weight loss.  Chest x-ray at that time revealed soft tissue density felt to possibly represent thick overlying and overhanging skin from neck but could not rule out laryngeal mass.  CT recommended.  CT scan of the neck with contrast showed no mass or adenopathy.  His weight has increased 12 pounds since last visit.  He states his appetite is stable and really possibly improved some.  No peripheral edema issues.  Recent lab work including TSH, basic metabolic panel, CBC reassuring.    He does wish to discuss depression.  Longstanding history of depression and has had some recent increased depressed mood but no suicidal ideation.  Currently on fluoxetine 40 mg daily and Abilify 2 mg at night.  Is wondering about possible titration of dosage.  Past Medical History:  Diagnosis Date   CEREBROVASCULAR ACCIDENT, HX OF 11/29/2010   DEPRESSION 11/29/2010   Hearing loss    HYPERLIPIDEMIA 11/29/2010   OBSTRUCTIVE SLEEP APNEA 11/29/2010   CPAP   PTSD (post-traumatic stress disorder)    Stroke [facility])    Past Surgical History:  Procedure Laterality Date   BACK SURGERY  1962   HAND SURGERY     KNEE SURGERY     SHOULDER ARTHROSCOPY WITH ROTATOR CUFF REPAIR      reports that he quit smoking about 35 years ago. His smoking use included cigarettes. He started smoking about 55 years ago. He has a 30 pack-year smoking history. He has never used smokeless tobacco. He reports that he does not drink alcohol and does not use drugs. family history includes Breast cancer in his mother; Heart failure in his father. He was adopted. No Known Allergies  Review of Systems  Constitutional:  Negative for fever,  malaise/fatigue and weight loss.  Eyes:  Negative for blurred vision.  Respiratory:  Negative for shortness of breath.   Cardiovascular:  Negative for chest pain.  Gastrointestinal:  Negative for abdominal pain.  Neurological:  Negative for dizziness, weakness and headaches.  Psychiatric/Behavioral:  Positive for depression. Negative for suicidal ideas.       Objective:     BP 120/74 (BP Location: Left Arm, Patient Position: Sitting, Cuff Size: Normal)   Pulse 67   Temp 97.6 F (36.4 C) (Oral)   Ht 5\' 6"  (1.676 m)   Wt 188 lb 9.6 oz (85.5 kg)   SpO2 98%   BMI 30.44 kg/m  BP Readings from Last 3 Encounters:  10/27/23 120/74  08/11/23 100/68  04/21/23 104/60   Wt Readings from Last 3 Encounters:  10/27/23 188 lb 9.6 oz (85.5 kg)  08/11/23 176 lb 14.4 oz (80.2 kg)  04/21/23 186 lb 1.6 oz (84.4 kg)      Physical Exam Vitals reviewed.  Constitutional:      General: He is not in acute distress.    Appearance: He is not ill-appearing.  Cardiovascular:     Rate and Rhythm: Normal rate and regular rhythm.  Pulmonary:     Effort: Pulmonary effort is normal.     Breath sounds: Normal breath sounds.  Musculoskeletal:     Cervical back: Neck supple.  Lymphadenopathy:  Cervical: No cervical adenopathy.  Neurological:     General: No focal deficit present.     Mental Status: He is alert.  Psychiatric:        Mood and Affect: Mood normal.        Thought Content: Thought content normal.      No results found for any visits on 10/27/23.  Last CBC Lab Results  Component Value Date   WBC 3.2 (L) 08/11/2023   HGB 13.6 08/11/2023   HCT 41.2 08/11/2023   MCV 102.1 (H) 08/11/2023   MCH 35.4 (H) 03/11/2020   RDW 14.9 08/11/2023   PLT 200.0 08/11/2023   Last metabolic panel Lab Results  Component Value Date   GLUCOSE 99 08/11/2023   NA 136 08/11/2023   K 3.9 08/11/2023   CL 97 08/11/2023   CO2 29 08/11/2023   BUN 10 08/11/2023   CREATININE 0.66 08/11/2023    GFR 87.45 08/11/2023   CALCIUM 8.9 08/11/2023   PROT 6.8 04/24/2023   ALBUMIN 3.8 04/24/2023   BILITOT 0.6 04/24/2023   ALKPHOS 58 04/24/2023   AST 21 04/24/2023   ALT 20 04/24/2023   ANIONGAP 10 03/11/2020   Last thyroid functions Lab Results  Component Value Date   TSH 4.06 08/11/2023      The ASCVD Risk score (Arnett DK, et al., 2019) failed to calculate for the following reasons:   The 2019 ASCVD risk score is only valid for ages 57 to 59   Risk score cannot be calculated because patient has a medical history suggesting prior/existing ASCVD    Assessment & Plan:   #1 recent abnormal imaging as above.  CT neck reassuring with no evidence for adenopathy or soft tissue malignancy/mass.  He does have some prominent redundant skin in the anterior neck which hangs down which is probably creating the shadow on chest x-ray  #2 recent weight loss.  Recent labs reassuring.  His weight has reversed has gained 12 pounds since last visit.  Continue to monitor  #3 history of recurrent depression.  Currently on fluoxetine 40 mg daily and low-dose Abilify.  We did discuss possible titration Abilify to 5 mg with new prescription sent.  Set up follow-up in a couple months to reassess and sooner as needed  Evelena Peat, MD

## 2023-10-27 NOTE — Patient Instructions (Signed)
Increase the Abilify to 5 mg once daily  Set up follow up in about 2 months.    CT scan looked good with no abnormal findings.

## 2023-11-14 ENCOUNTER — Encounter: Payer: Self-pay | Admitting: Family Medicine

## 2023-11-14 ENCOUNTER — Ambulatory Visit (INDEPENDENT_AMBULATORY_CARE_PROVIDER_SITE_OTHER): Payer: Medicare HMO | Admitting: Family Medicine

## 2023-11-14 VITALS — BP 110/70 | HR 59 | Temp 97.8°F | Wt 193.2 lb

## 2023-11-14 DIAGNOSIS — F330 Major depressive disorder, recurrent, mild: Secondary | ICD-10-CM | POA: Diagnosis not present

## 2023-11-14 DIAGNOSIS — E538 Deficiency of other specified B group vitamins: Secondary | ICD-10-CM

## 2023-11-14 DIAGNOSIS — E7849 Other hyperlipidemia: Secondary | ICD-10-CM | POA: Diagnosis not present

## 2023-11-14 LAB — COMPREHENSIVE METABOLIC PANEL
ALT: 16 U/L (ref 0–53)
AST: 18 U/L (ref 0–37)
Albumin: 3.9 g/dL (ref 3.5–5.2)
Alkaline Phosphatase: 60 U/L (ref 39–117)
BUN: 12 mg/dL (ref 6–23)
CO2: 32 meq/L (ref 19–32)
Calcium: 8.7 mg/dL (ref 8.4–10.5)
Chloride: 98 meq/L (ref 96–112)
Creatinine, Ser: 0.64 mg/dL (ref 0.40–1.50)
GFR: 88.11 mL/min (ref >60.00)
Glucose, Bld: 101 mg/dL — ABNORMAL HIGH (ref 70–99)
Potassium: 4 meq/L (ref 3.5–5.1)
Sodium: 135 meq/L (ref 135–145)
Total Bilirubin: 0.5 mg/dL (ref 0.2–1.2)
Total Protein: 6.7 g/dL (ref 6.0–8.3)

## 2023-11-14 LAB — LIPID PANEL
Cholesterol: 212 mg/dL — ABNORMAL HIGH (ref 0–200)
HDL: 61.1 mg/dL (ref >39.00)
LDL Cholesterol: 140 mg/dL — ABNORMAL HIGH (ref 0–99)
NonHDL: 151.28
Total CHOL/HDL Ratio: 3
Triglycerides: 58 mg/dL (ref 0.0–149.0)
VLDL: 11.6 mg/dL (ref 0.0–40.0)

## 2023-11-14 NOTE — Progress Notes (Signed)
 Established Patient Office Visit  Subjective   Patient ID: Richard Fernandez, male    DOB: Jul 15, 1941  Age: 83 y.o. MRN: [id number]  Chief Complaint  Patient presents with   Medical Management of Chronic Issues    HPI   Richard Fernandez is seen today for medical follow-up.  He has longstanding history of recurrent depression.  He had inquired last visit about bumping up his Abilify  and we increase this from 2.5 mg to 5 mg.  He feels like this has benefited him.  He is sleeping better and feels less depressed.  Also maintained on fluoxetine  40 mg daily.  No suicidal ideation.  He has hyperlipidemia treated with rosuvastatin  40 mg daily.  Overdue for lipid panel.  Compliant with therapy.  Denies any myalgias or other side effects.  Denies any recent chest pains or dizziness.  History of B12 deficiency.  Last B12 level is normal but he was low about a year ago.  Currently not taking any B12.  Past Medical History:  Diagnosis Date   CEREBROVASCULAR ACCIDENT, HX OF 11/29/2010   DEPRESSION 11/29/2010   Hearing loss    HYPERLIPIDEMIA 11/29/2010   OBSTRUCTIVE SLEEP APNEA 11/29/2010   CPAP   PTSD (post-traumatic stress disorder)    Stroke [facility])    Past Surgical History:  Procedure Laterality Date   BACK SURGERY  1962   HAND SURGERY     KNEE SURGERY     SHOULDER ARTHROSCOPY WITH ROTATOR CUFF REPAIR      reports that he quit smoking about 35 years ago. His smoking use included cigarettes. He started smoking about 55 years ago. He has a 30 pack-year smoking history. He has never used smokeless tobacco. He reports that he does not drink alcohol and does not use drugs. family history includes Breast cancer in his mother; Heart failure in his father. He was adopted. No Known Allergies  Review of Systems  Constitutional:  Negative for malaise/fatigue.  Eyes:  Negative for blurred vision.  Respiratory:  Negative for shortness of breath.   Cardiovascular:  Negative for chest pain.  Gastrointestinal:   Negative for abdominal pain.  Neurological:  Negative for dizziness, weakness and headaches.      Objective:     BP 110/70 (BP Location: Left Arm, Patient Position: Sitting, Cuff Size: Normal)   Pulse (!) 59   Temp 97.8 F (36.6 C) (Oral)   Wt 193 lb 3.2 oz (87.6 kg)   SpO2 98%   BMI 31.18 kg/m  BP Readings from Last 3 Encounters:  11/14/23 110/70  10/27/23 120/74  08/11/23 100/68   Wt Readings from Last 3 Encounters:  11/14/23 193 lb 3.2 oz (87.6 kg)  10/27/23 188 lb 9.6 oz (85.5 kg)  08/11/23 176 lb 14.4 oz (80.2 kg)      Physical Exam Vitals reviewed.  Constitutional:      Appearance: He is well-developed.  Eyes:     Pupils: Pupils are equal, round, and reactive to light.  Neck:     Thyroid : No thyromegaly.  Cardiovascular:     Rate and Rhythm: Normal rate and regular rhythm.  Pulmonary:     Effort: Pulmonary effort is normal. No respiratory distress.     Breath sounds: Normal breath sounds. No wheezing or rales.  Musculoskeletal:     Cervical back: Neck supple.  Neurological:     Mental Status: He is alert and oriented to person, place, and time.  Psychiatric:        Mood and Affect:  Mood normal.        Thought Content: Thought content normal.      No results found for any visits on 11/14/23.    The ASCVD Risk score (Arnett DK, et al., 2019) failed to calculate for the following reasons:   The 2019 ASCVD risk score is only valid for ages 44 to 66   Risk score cannot be calculated because patient has a medical history suggesting prior/existing ASCVD    Assessment & Plan:   #1 hyperlipidemia.  Patient on rosuvastatin  40 mg daily.  Due for follow-up labs.  Recheck lipid and CMP.  Continue low saturated fat diet  #2 history of recurrent depression improved with recent increase in Abilify  to 5 mg daily.  He takes in addition to fluoxetine  40 mg daily.  Continue current regimen  #3 history of B12 deficiency.  Currently not on supplement.  We suggested  over-the-counter B12 500 to 1000 mcg daily.  Consider B12 level at follow-up No follow-ups on file.    Wolm Scarlet, MD

## 2023-11-14 NOTE — Patient Instructions (Signed)
 Get back on over the counter B12 500 to 1,000 mcg daily.

## 2023-11-17 MED ORDER — ROSUVASTATIN CALCIUM 40 MG PO TABS: ORAL_TABLET | ORAL | 1 refills | Status: DC

## 2023-11-17 NOTE — Addendum Note (Signed)
 Addended by: Christy Sartorius on: 11/17/2023 10:12 AM   Modules accepted: Orders

## 2024-01-18 ENCOUNTER — Encounter (HOSPITAL_BASED_OUTPATIENT_CLINIC_OR_DEPARTMENT_OTHER): Payer: Self-pay | Admitting: Emergency Medicine

## 2024-01-18 ENCOUNTER — Other Ambulatory Visit: Payer: Self-pay

## 2024-01-18 DIAGNOSIS — Z203 Contact with and (suspected) exposure to rabies: Secondary | ICD-10-CM | POA: Insufficient documentation

## 2024-01-18 DIAGNOSIS — S51851A Open bite of right forearm, initial encounter: Secondary | ICD-10-CM | POA: Diagnosis not present

## 2024-01-18 DIAGNOSIS — Z87891 Personal history of nicotine dependence: Secondary | ICD-10-CM | POA: Diagnosis not present

## 2024-01-18 DIAGNOSIS — W5501XA Bitten by cat, initial encounter: Secondary | ICD-10-CM | POA: Diagnosis not present

## 2024-01-18 DIAGNOSIS — Z23 Encounter for immunization: Secondary | ICD-10-CM | POA: Insufficient documentation

## 2024-01-18 DIAGNOSIS — Z2914 Encounter for prophylactic rabies immune globin: Secondary | ICD-10-CM | POA: Insufficient documentation

## 2024-01-18 DIAGNOSIS — S51811A Laceration without foreign body of right forearm, initial encounter: Secondary | ICD-10-CM | POA: Diagnosis not present

## 2024-01-18 MED ORDER — LIDOCAINE HCL (PF) 1 % IJ SOLN
10.0000 mL | Freq: Once | INTRAMUSCULAR | Status: AC
  Administered 2024-01-19: 10 mL
  Filled 2024-01-18: qty 10

## 2024-01-18 NOTE — ED Triage Notes (Signed)
 Pt via pov from home with arm injury after being attacked by a cat. Pt was walking his dog and a cat tried to attack his dog but got his arm by mistake. Pt has large skin tear on right forearm. Pt alert & oriented, nad noted.

## 2024-01-19 ENCOUNTER — Emergency Department (HOSPITAL_BASED_OUTPATIENT_CLINIC_OR_DEPARTMENT_OTHER)
Admission: EM | Admit: 2024-01-19 | Discharge: 2024-01-19 | Disposition: A | Discharge status: Home / Self Care | Attending: Emergency Medicine | Admitting: Emergency Medicine

## 2024-01-19 DIAGNOSIS — S51811A Laceration without foreign body of right forearm, initial encounter: Secondary | ICD-10-CM

## 2024-01-19 DIAGNOSIS — W5501XA Bitten by cat, initial encounter: Secondary | ICD-10-CM

## 2024-01-19 MED ORDER — RABIES VACCINE, PCEC IM SUSR
1.0000 mL | Freq: Once | INTRAMUSCULAR | Status: AC
  Administered 2024-01-19: 1 mL via INTRAMUSCULAR
  Filled 2024-01-19: qty 1

## 2024-01-19 MED ORDER — AMOXICILLIN-POT CLAVULANATE 875-125 MG PO TABS
1.0000 | ORAL_TABLET | Freq: Once | ORAL | Status: AC
  Administered 2024-01-19: 1 via ORAL
  Filled 2024-01-19: qty 1

## 2024-01-19 MED ORDER — TETANUS-DIPHTH-ACELL PERTUSSIS 5-2.5-18.5 LF-MCG/0.5 IM SUSY
0.5000 mL | PREFILLED_SYRINGE | Freq: Once | INTRAMUSCULAR | Status: AC
  Administered 2024-01-19: 0.5 mL via INTRAMUSCULAR
  Filled 2024-01-19: qty 0.5

## 2024-01-19 MED ORDER — RABIES IMMUNE GLOBULIN 150 UNIT/ML IM INJ
20.0000 [IU]/kg | INJECTION | Freq: Once | INTRAMUSCULAR | Status: AC
  Administered 2024-01-19: 1725 [IU]
  Filled 2024-01-19: qty 12

## 2024-01-19 MED ORDER — AMOXICILLIN-POT CLAVULANATE 875-125 MG PO TABS: 1.0000 | ORAL_TABLET | Freq: Two times a day (BID) | ORAL | 0 refills | Status: DC

## 2024-01-19 NOTE — Discharge Instructions (Addendum)
 You were evaluated in the Emergency Department and after careful evaluation, we did not find any emergent condition requiring admission or further testing in the hospital.  Your exam/testing today is overall reassuring.  The skin tear will have to heal slowly from the inside out.  Take the Augmentin antibiotic to prevent infection.  Return for repeat rabies vaccine doses on day 3, 7, and day 14 as we discussed.  Please return to the Emergency Department if you experience any worsening of your condition.   Thank you for allowing [place] to be a part of your care.

## 2024-01-19 NOTE — ED Provider Notes (Signed)
 [facility] Emergency Department Provider Note MRN:  [id number]  Arrival date & time: 01/19/24     Chief Complaint   Arm Injury   History of Present Illness   Richard Fernandez is a 83 y.o. year-old male with a history of stroke presenting to the ED with chief complaint of arm injury.  Patient was outside walking his dog and the dog was sniffing out of bush and suddenly an orange Emerged from the bush and began assaulting the dog.  Patient tried to separate the animals and then the cat bit and scratched the patient on the right forearm causing a skin tear.  Patient was able to hit the on the head with his cane, which ended the altercation.  Review of Systems  A thorough review of systems was obtained and all systems are negative except as noted in the HPI and PMH.   Patient's Health History    Past Medical History:  Diagnosis Date   CEREBROVASCULAR ACCIDENT, HX OF 11/29/2010   DEPRESSION 11/29/2010   Hearing loss    HYPERLIPIDEMIA 11/29/2010   OBSTRUCTIVE SLEEP APNEA 11/29/2010   CPAP   PTSD (post-traumatic stress disorder)    Stroke [facility])     Past Surgical History:  Procedure Laterality Date   BACK SURGERY  1962   HAND SURGERY     KNEE SURGERY     SHOULDER ARTHROSCOPY WITH ROTATOR CUFF REPAIR      Family History  Adopted: Yes  Problem Relation Age of Onset   Breast cancer Mother    Heart failure Father     Social History   Socioeconomic History   Marital status: Married    Spouse name: Verlon Au    Number of children: 1   Years of education: college   Highest education level: Not on file  Occupational History   Occupation: part time counselor   Tobacco Use   Smoking status: Former    Current packs/day: 0.00    Average packs/day: 1.5 packs/day for 20.0 years (30.0 ttl pk-yrs)    Types: Cigarettes    Start date: 11/04/1968    Quit date: 11/04/1988    Years since quitting: 35.2   Smokeless tobacco: Never  Vaping Use   Vaping status: Never Used   Substance and Sexual Activity   Alcohol use: No    Alcohol/week: 0.0 standard drinks of alcohol   Drug use: No   Sexual activity: Not on file  Other Topics Concern   Not on file  Social History Narrative   Pts is adopted. Patient lives at home with his Verlon Au.   Retired but still working some.   Education some college.   Left handed.   Caffeine four cups of coffee daily.      Social Drivers of Corporate investment banker Strain: Low Risk  (09/17/2022)   Overall Financial Resource Strain (CARDIA)    Difficulty of Paying Living Expenses: Not very hard  Food Insecurity: No Food Insecurity (08/02/2021)   Hunger Vital Sign    Worried About Running Out of Food in the Last Year: Never true    Ran Out of Food in the Last Year: Never true  Transportation Needs: No Transportation Needs (05/14/2021)   PRAPARE - Administrator, Civil Service (Medical): No    Lack of Transportation (Non-Medical): No  Physical Activity: Sufficiently Active (08/02/2021)   Exercise Vital Sign    Days of Exercise per Week: 5 days    Minutes of Exercise per  Session: 30 min  Stress: No Stress Concern Present (08/02/2021)   [employer] of Occupational Health - Occupational Stress Questionnaire    Feeling of Stress : Not at all  Social Connections: Moderately Integrated (08/02/2021)   Social Connection and Isolation Panel [NHANES]    Frequency of Communication with Friends and Family: Three times a week    Frequency of Social Gatherings with Friends and Family: Three times a week    Attends Religious Services: More than 4 times per year    Active Member of Clubs or Organizations: No    Attends Banker Meetings: Never    Marital Status: Married  Catering manager Violence: Not At Risk (08/02/2021)   Humiliation, Afraid, Rape, and Kick questionnaire    Fear of Current or Ex-Partner: No    Emotionally Abused: No    Physically Abused: No    Sexually Abused: No     Physical Exam    Vitals:   01/18/24 2220  BP: 105/71  Pulse: 75  Resp: 18  Temp: 98.4 F (36.9 C)  SpO2: 100%    CONSTITUTIONAL: Well-appearing, NAD NEURO/PSYCH:  Alert and oriented x 3, no focal deficits EYES:  eyes equal and reactive ENT/NECK:  no LAD, no JVD CARDIO: Regular rate, well-perfused, normal S1 and S2 PULM:  CTAB no wheezing or rhonchi GI/GU:  non-distended, non-tender MSK/SPINE:  No gross deformities, no edema SKIN: Skin tear to right anterior forearm   *Additional and/or pertinent findings included in MDM below  Diagnostic and Interventional Summary    EKG Interpretation Date/Time:    Ventricular Rate:    PR Interval:    QRS Duration:    QT Interval:    QTC Calculation:   R Axis:      Text Interpretation:         Labs Reviewed - No data to display  No orders to display    Medications  Tdap (BOOSTRIX) injection 0.5 mL (has no administration in time range)  amoxicillin-clavulanate (AUGMENTIN) 875-125 MG per tablet 1 tablet (has no administration in time range)  rabies vaccine (RABAVERT) injection 1 mL (has no administration in time range)  rabies immune globulin (HYPERRAB/KEDRAB) injection 1,725 Units (has no administration in time range)  lidocaine (PF) (XYLOCAINE) 1 % injection 10 mL (10 mLs Infiltration Given 01/19/24 0217)     Procedures  /  Critical Care Procedures  ED Course and Medical Decision Making  Initial Impression and Ddx Hemostatic skin tear due to cat bite or cat scratch.  This is an unknown cat to the patient and seemed to be exhibiting some aggressive behavior, possibly just normal cat behavior however given the lack of vaccine history knowledge will need rabies vaccine and immunoglobulin.  The skin tear does not appear contaminated, will wash here in the emergency department and apply Xeroform dressing.  Patient will also have tetanus updated and be covered with Augmentin.  Denies any other injuries.  Past medical/surgical history that  increases complexity of ED encounter: Stroke, anticoagulated  Interpretation of Diagnostics Laboratory and/or imaging options to aid in the diagnosis/care of the patient were considered.  After careful history and physical examination, it was determined that there was no indication for diagnostics at this time.  Patient Reassessment and Ultimate Disposition/Management     Discharge  Patient management required discussion with the following services or consulting groups:  None  Complexity of Problems Addressed Acute complicated illness or Injury  Additional Data Reviewed and Analyzed Further history obtained from: Further history  from spouse/family member  Additional Factors Impacting ED Encounter Risk Prescriptions  Elmer Sow. Pilar Plate, MD [facility] Emergency Medicine [facility] [email or web address]  Final Clinical Impressions(s) / ED Diagnoses     ICD-10-CM   1. Cat bite, initial encounter  W55.01XA     2. Skin tear of right forearm without complication, initial encounter  S51.811A       ED Discharge Orders          Ordered    amoxicillin-clavulanate (AUGMENTIN) 875-125 MG tablet  Every 12 hours        01/19/24 0256             Discharge Instructions Discussed with and Provided to Patient:    Discharge Instructions      You were evaluated in the Emergency Department and after careful evaluation, we did not find any emergent condition requiring admission or further testing in the hospital.  Your exam/testing today is overall reassuring.  The skin tear will have to heal slowly from the inside out.  Take the Augmentin antibiotic to prevent infection.  Return for repeat rabies vaccine doses on day 3, 7, and day 14 as we discussed.  Please return to the Emergency Department if you experience any worsening of your condition.   Thank you for allowing [place] to be a part of your care.      Sabas Sous, MD 01/19/24 [phone]

## 2024-01-19 NOTE — ED Notes (Signed)
 R lower arm wound cleaned with normal saline.  Xeroform place and wound wrapped with gauze.  Patient instructed on wound care at home

## 2024-01-22 ENCOUNTER — Ambulatory Visit (HOSPITAL_COMMUNITY)
Admission: EM | Admit: 2024-01-22 | Discharge: 2024-01-22 | Disposition: A | Discharge status: Home / Self Care | Attending: Emergency Medicine | Admitting: Emergency Medicine

## 2024-01-22 DIAGNOSIS — Z23 Encounter for immunization: Secondary | ICD-10-CM

## 2024-01-22 DIAGNOSIS — Z203 Contact with and (suspected) exposure to rabies: Secondary | ICD-10-CM

## 2024-01-22 MED ORDER — RABIES VACCINE, PCEC IM SUSR
1.0000 mL | Freq: Once | INTRAMUSCULAR | Status: AC
  Administered 2024-01-22: 1 mL via INTRAMUSCULAR

## 2024-01-22 MED ORDER — RABIES VACCINE, PCEC IM SUSR
INTRAMUSCULAR | Status: AC
  Filled 2024-01-22: qty 1

## 2024-01-22 NOTE — ED Notes (Signed)
 Day 3 rabies injection given I the left deltoid and tolerated well.   Patient wound unwrapped and cleaned. Called provider on staff Wynonia Lawman, FNP to bedside to access the wound and type of dressing needed. Wound slightly opened with minimal bleeding. No noted discharge or swelling, area is bruised.   Verbal given to place xeroform dressing on the wound to the anterior right forearm.

## 2024-01-22 NOTE — ED Triage Notes (Signed)
 Patient presenting for day 3 rabies vaccine. Denies any reactions to the previous injections. States the wound on the right arm is sore and he is requesting the dressing on it be changed for him.

## 2024-01-26 ENCOUNTER — Ambulatory Visit (HOSPITAL_COMMUNITY)
Admission: EM | Admit: 2024-01-26 | Discharge: 2024-01-26 | Disposition: A | Discharge status: Home / Self Care | Attending: Family Medicine | Admitting: Family Medicine

## 2024-01-26 ENCOUNTER — Encounter (HOSPITAL_COMMUNITY): Payer: Self-pay

## 2024-01-26 ENCOUNTER — Telehealth: Payer: Self-pay

## 2024-01-26 DIAGNOSIS — Z203 Contact with and (suspected) exposure to rabies: Secondary | ICD-10-CM | POA: Diagnosis not present

## 2024-01-26 MED ORDER — RABIES VACCINE, PCEC IM SUSR
INTRAMUSCULAR | Status: AC
  Filled 2024-01-26: qty 1

## 2024-01-26 MED ORDER — RABIES VACCINE, PCEC IM SUSR
1.0000 mL | Freq: Once | INTRAMUSCULAR | Status: AC
  Administered 2024-01-26: 1 mL via INTRAMUSCULAR

## 2024-01-26 NOTE — ED Triage Notes (Signed)
 Pt presents to UC for day 7 of rabies vaccine. Denies reactions to previous vaccines or other concerns. Pt wants dressing changed to right forearm.

## 2024-01-26 NOTE — Telephone Encounter (Signed)
 Copied from CRM [id number]. Topic: Clinical - Medical Advice >> Jan 26, 2024  2:25 PM Sim Boast F wrote: Reason for CRM: Patient wants to know if a 4th rabie shot is necessary?

## 2024-01-26 NOTE — ED Notes (Signed)
 Dressing changed to right forearm with nonadherent dressing and coban

## 2024-01-28 NOTE — Telephone Encounter (Signed)
Left a message for the patient to return my call,

## 2024-01-28 NOTE — Telephone Encounter (Signed)
 Patient returned called I relayed message to patient

## 2024-01-28 NOTE — Telephone Encounter (Signed)
 Noted.

## 2024-02-02 ENCOUNTER — Ambulatory Visit (HOSPITAL_COMMUNITY)
Admission: EM | Admit: 2024-02-02 | Discharge: 2024-02-02 | Disposition: A | Discharge status: Home / Self Care | Attending: Family Medicine | Admitting: Family Medicine

## 2024-02-02 ENCOUNTER — Encounter (HOSPITAL_COMMUNITY): Payer: Self-pay | Admitting: Emergency Medicine

## 2024-02-02 DIAGNOSIS — Z203 Contact with and (suspected) exposure to rabies: Secondary | ICD-10-CM

## 2024-02-02 DIAGNOSIS — Z5189 Encounter for other specified aftercare: Secondary | ICD-10-CM

## 2024-02-02 MED ORDER — RABIES VACCINE, PCEC IM SUSR
INTRAMUSCULAR | Status: AC
  Filled 2024-02-02: qty 1

## 2024-02-02 MED ORDER — RABIES VACCINE, PCEC IM SUSR
1.0000 mL | Freq: Once | INTRAMUSCULAR | Status: AC
  Administered 2024-02-02: 1 mL via INTRAMUSCULAR

## 2024-02-02 NOTE — ED Triage Notes (Signed)
 Pt here to get 4th and final rabies vaccination. Denies any complications

## 2024-02-02 NOTE — ED Provider Notes (Signed)
 [facility]    CSN: [id number] Arrival date & time: 02/02/24  1111      History   Chief Complaint Chief Complaint  Patient presents with   Rabies Injection    HPI Richard Fernandez is a 83 y.o. male.   HPI  Patient presents today for final rabies vaccination.  He reports that he has been getting dressing changes when he presents for rabies vaccinations and is concerned for the wound today.     Past Medical History:  Diagnosis Date   CEREBROVASCULAR ACCIDENT, HX OF 11/29/2010   DEPRESSION 11/29/2010   Hearing loss    HYPERLIPIDEMIA 11/29/2010   OBSTRUCTIVE SLEEP APNEA 11/29/2010   CPAP   PTSD (post-traumatic stress disorder)    Stroke [facility])     Patient Active Problem List   Diagnosis Date Noted   B12 deficiency 02/10/2023   SCCA (squamous cell carcinoma) of skin 08/11/2019   Bilateral foot-drop 02/24/2018   Left-sided weakness 10/07/2016   Weakness of left arm 10/07/2016   PTSD (post-traumatic stress disorder)    Bipolar I disorder (HCC)    History of stroke    Morbid obesity due to excess calories (HCC) 12/27/2015   OSA on CPAP 12/27/2015   Memory difficulty 12/27/2015   Obesity (BMI 30-39.9) 10/18/2013   Hyperlipidemia 11/29/2010   Depression 11/29/2010   Obstructive sleep apnea 11/29/2010   History of cardiovascular disorder 11/29/2010    Past Surgical History:  Procedure Laterality Date   BACK SURGERY  1962   HAND SURGERY     KNEE SURGERY     SHOULDER ARTHROSCOPY WITH ROTATOR CUFF REPAIR         Home Medications    Prior to Admission medications   Medication Sig Start Date End Date Taking? Authorizing Provider  amoxicillin-clavulanate (AUGMENTIN) 875-125 MG tablet Take 1 tablet by mouth every 12 (twelve) hours. 01/19/24   Sabas Sous, MD  ARIPiprazole (ABILIFY) 5 MG tablet Take 1 tablet (5 mg total) by mouth daily. 10/27/23   Burchette, Elberta Fortis, MD  busPIRone (BUSPAR) 7.5 MG tablet TAKE 1 TABLET(7.5 MG) BY MOUTH TWICE DAILY 01/03/23    Burchette, Elberta Fortis, MD  clopidogrel (PLAVIX) 75 MG tablet TAKE 1 TABLET(75 MG) BY MOUTH DAILY 07/01/23   Burchette, Elberta Fortis, MD  FLUoxetine (PROZAC) 40 MG capsule TAKE 1 CAPSULE(40 MG) BY MOUTH DAILY 06/19/23   Burchette, Elberta Fortis, MD  Hypromellose (ARTIFICIAL TEARS OP) Place 1 drop into both eyes daily as needed (dry eyes).    [provider]  rosuvastatin (CRESTOR) 40 MG tablet TAKE 1 TABLET(40 MG) BY MOUTH DAILY 11/17/23   Burchette, Elberta Fortis, MD  sildenafil (VIAGRA) 50 MG tablet TAKE 1 TABLET(50 MG) BY MOUTH DAILY AS NEEDED FOR ERECTILE DYSFUNCTION 08/26/23   Burchette, Elberta Fortis, MD    Family History Family History  Adopted: Yes  Problem Relation Age of Onset   Breast cancer Mother    Heart failure Father     Social History Social History   Tobacco Use   Smoking status: Former    Current packs/day: 0.00    Average packs/day: 1.5 packs/day for 20.0 years (30.0 ttl pk-yrs)    Types: Cigarettes    Start date: 11/04/1968    Quit date: 11/04/1988    Years since quitting: 35.2   Smokeless tobacco: Never  Vaping Use   Vaping status: Never Used  Substance Use Topics   Alcohol use: No    Alcohol/week: 0.0 standard drinks of alcohol  Drug use: No     Allergies   Patient has no known allergies.   Review of Systems Review of Systems  Skin:  Positive for wound.     Physical Exam Triage Vital Signs ED Triage Vitals [02/02/24 1216]  Encounter Vitals Group     BP      Systolic BP Percentile      Diastolic BP Percentile      Pulse      Resp      Temp      Temp src      SpO2      Weight      Height      Head Circumference      Peak Flow      Pain Score 0     Pain Loc      Pain Education      Exclude from Growth Chart    No data found.  Updated Vital Signs There were no vitals taken for this visit.  Visual Acuity Right Eye Distance:   Left Eye Distance:   Bilateral Distance:    Right Eye Near:   Left Eye Near:    Bilateral Near:     Physical  Exam Vitals reviewed.  Constitutional:      General: He is awake.     Appearance: Normal appearance. He is well-developed and well-groomed.  HENT:     Head: Normocephalic and atraumatic.  Pulmonary:     Effort: Pulmonary effort is normal.  Skin:    General: Skin is warm and dry.     Findings: Wound present.     Comments: Approximately 5 cm long laceration with several small punctate wounds surrounding this on the right forearm.  Area appears to be well-healing without signs of purulent drainage, swelling, erythema.  There is some mild redness and potential purpura along the area but this does not lend itself to signs of infection.  Neurological:     Mental Status: He is alert.  Psychiatric:        Behavior: Behavior is cooperative.      UC Treatments / Results  Labs (all labs ordered are listed, but only abnormal results are displayed) Labs Reviewed - No data to display  EKG   Radiology No results found.  Procedures Procedures (including critical care time)  Medications Ordered in UC Medications  rabies vaccine (RABAVERT) injection 1 mL (1 mL Intramuscular Given 02/02/24 1219)    Initial Impression / Assessment and Plan / UC Course  I have reviewed the triage vital signs and the nursing notes.  Pertinent labs & imaging results that were available during my care of the patient were reviewed by me and considered in my medical decision making (see chart for details).      Final Clinical Impressions(s) / UC Diagnoses   Final diagnoses:  Rabies exposure  Visit for wound check   Patient presents today for his fourth and final rabies vaccination following a cat bite on 01/19/2024.  Fourth rabies vaccination was administered.  He also voiced concerns about his wound stating that he has not been doing regular dressing changes at home and would like it evaluated.  Wound appears well-healing at this time and does not show signs of swelling, erythema, purulent drainage.   Reviewed with him that he can probably go without dressings at this time given the fact that it is predominantly closed and does not appear to be draining at all.  Recommend gentle cleansing with warm water  and gentle soap and pat to dry.  Reviewed that he can apply Aquaphor or Vaseline to the area to help prevent dryness and aid with further healing and closure.  ED and return precautions were reviewed and provided in after visit summary.  Follow-up as needed for progressing or persistent symptoms    Discharge Instructions      The wound on your right forearm appears to be healing well.  At this time I recommend keeping it clean with a gentle cleanser and warm water and then patting it to dry.  You can apply Aquaphor or Vaseline to the area to help prevent it from getting dry and to aid with healing. If at any point you start to notice more severe pain at the area, redness, swelling, drainage that looks like pus or bleeding please return here to urgent care or follow-up with your primary care provider for further evaluation and potential management.     ED Prescriptions   None    PDMP not reviewed this encounter.   Demetrias Goodbar, Oswaldo Conroy, PA-C 02/02/24 1249

## 2024-02-02 NOTE — ED Notes (Signed)
 Upon discharging patient and going over AVS after vaccination administered, pt asked if we were going to change his dressing on right forearm. This RN asked if patient had been changing it at home. Pt responded that he did once but was getting it done at his visits and "I was told they would take it off when I was finished". This RN informed patient that would need to confirm with provider about the dressing change as that might would require a visit with a provider and not just a nurse visit. This RN confirmed with Dr Marlinda Mike that would turn into an office visit if needing dressing changes. Kirby Crigler, PA and Dr Marlinda Mike that would have dressing removed so could evaluate patients cat bit area.  This RN removed patient's dressing from right forearm. Pt reports that it had not been changed in a week since his last visit to get his 3rd rabies vaccination.

## 2024-02-02 NOTE — Discharge Instructions (Addendum)
 The wound on your right forearm appears to be healing well.  At this time I recommend keeping it clean with a gentle cleanser and warm water and then patting it to dry.  You can apply Aquaphor or Vaseline to the area to help prevent it from getting dry and to aid with healing. If at any point you start to notice more severe pain at the area, redness, swelling, drainage that looks like pus or bleeding please return here to urgent care or follow-up with your primary care provider for further evaluation and potential management.

## 2024-02-17 ENCOUNTER — Other Ambulatory Visit: Payer: Self-pay | Admitting: Family Medicine

## 2024-03-01 ENCOUNTER — Ambulatory Visit: Admitting: Podiatry

## 2024-04-02 ENCOUNTER — Ambulatory Visit: Admitting: Podiatry

## 2024-04-02 ENCOUNTER — Encounter: Payer: Self-pay | Admitting: Podiatry

## 2024-04-02 DIAGNOSIS — Z7901 Long term (current) use of anticoagulants: Secondary | ICD-10-CM | POA: Diagnosis not present

## 2024-04-02 DIAGNOSIS — B351 Tinea unguium: Secondary | ICD-10-CM

## 2024-04-02 NOTE — Progress Notes (Signed)
 Subjective: Chief Complaint  Patient presents with   RFC    RM#13 RFC      83 year old male presents the office for above concerns.  He states the toes are doing better.  States he has difficulty trimming the nails that are thickened elongated he could not do them himself.  No open lesions.    He is on Plavix   Objective: AAO x3, NAD DP/PT pulses palpable bilaterally, CRT less than 3 seconds Nails are hypertrophic, dystrophic with yellow, brown discoloration most notably on the hallux.  No edema, erythema to the toenail sites.  Tenderness nails 1-5 bilaterally.   No open lesions noted otherwise. No pain with calf compression, swelling, warmth, erythema  Assessment: 83 year old male with symptomatic onychomycosis, skin lesion left fifth toe  Plan: -All treatment options discussed with the patient including all alternatives, risks, complications.  -Sharply debrided nails x 10 without any complications or bleeding - Discussed daily foot inspection.   Return in about 3 months (around 07/03/2024).  Charity Conch DPM

## 2024-04-12 ENCOUNTER — Ambulatory Visit: Admitting: Podiatry

## 2024-06-01 ENCOUNTER — Ambulatory Visit (INDEPENDENT_AMBULATORY_CARE_PROVIDER_SITE_OTHER): Admitting: Family Medicine

## 2024-06-01 DIAGNOSIS — E7849 Other hyperlipidemia: Secondary | ICD-10-CM | POA: Diagnosis not present

## 2024-06-01 DIAGNOSIS — Z0000 Encounter for general adult medical examination without abnormal findings: Secondary | ICD-10-CM | POA: Diagnosis not present

## 2024-06-01 MED ORDER — ROSUVASTATIN CALCIUM 40 MG PO TABS: ORAL_TABLET | ORAL | 0 refills | Status: DC

## 2024-06-01 NOTE — Progress Notes (Signed)
 PATIENT CHECK-IN and HEALTH RISK ASSESSMENT QUESTIONNAIRE:  -completed by phone/video for upcoming Medicare Preventive Visit  Pre-Visit Check-in: 1)Vitals (height, wt, BP, etc) - record in vitals section for visit on day of visit Request home vitals (wt, BP, etc.) and enter into vitals, THEN update Vital Signs SmartPhrase below at the top of the HPI. See below.  2)Review and Update Medications, Allergies PMH, Surgeries, Social history in Epic 3)Hospitalizations in the last year with date/reason? n  4)Review and Update Care Team (patient's specialists) in Epic 5) Complete PHQ9 in Epic  6) Complete Fall Screening in Epic 7)Review all Health Maintenance Due and order if not done.  Medicare Wellness Patient Questionnaire:  Answer theses question about your habits: How often do you have a drink containing alcohol?n How many drinks containing alcohol do you have on a typical day when you are drinking?na How often do you have six or more drinks on one occasion?na Have you ever smoked?very remotely over 30 year ago On average, how many days per week do you engage in moderate to strenuous exercise (like a brisk walk)? Does some walking every day On average, how many minutes do you engage in exercise at this level? Mostly inside since his dog passed.  Typical he and his wife cook, sometimes they go out, they eat out rarely  Beverages: water sometimes coke  Answer theses question about your everyday activities: Can you perform most household chores?y Are you deaf or have significant trouble hearing?hearing aids Do you feel that you have a problem with memory? Some memory loss - but is mild Do you feel safe at home?yes Last dentist visit? Doesn't  go often 8. Do you have any difficulty performing your everyday activities?n Are you having any difficulty walking, taking medications on your own, and or difficulty managing daily home needs?n Do you have difficulty walking or climbing stairs?n Do  you have difficulty dressing or bathing?n Do you have difficulty doing errands alone such as visiting a doctor's office or shopping?n Do you currently have any difficulty preparing food and eating?n Do you currently have any difficulty using the toilet?n Do you have any difficulty managing your finances?n Do you have any difficulties with housekeeping of managing your housekeeping?n   Do you have Advanced Directives in place (Living Will, Healthcare Power or Attorney)? yes   Last eye Exam and location? Dr. Octavia, goes once a year   Do you currently use prescribed or non-prescribed narcotic or opioid pain medications?n  Do you have a history or close family history of breast, ovarian, tubal or peritoneal cancer or a family member with BRCA (breast cancer susceptibility 1 and 2) gene mutations?    ----------------------------------------------------------------------------------------------------------------------------------------------------------------------------------------------------------------------  Because this visit was a virtual/telehealth visit, some criteria may be missing or patient reported. Any vitals not documented were not able to be obtained and vitals that have been documented are patient reported.    MEDICARE ANNUAL PREVENTIVE CARE VISIT WITH PROVIDER (Welcome to Medicare, initial annual wellness or annual wellness exam)  Virtual Visit via Phone Note  I connected with Richard Fernandez on 06/01/24  by phone and verified that I am speaking with the correct person using two identifiers. Prefers phone visit.  Location patient: home Location provider:work or home office Persons participating in the virtual visit: patient, provider  Concerns and/or follow up today: stable, doing great.   See HM section in Epic for other details of completed HM.    ROS: negative for report of fevers, unintentional weight loss, vision  changes, vision loss, hearing loss or change,  chest pain, sob, hemoptysis, melena, hematochezia, hematuria, falls, bleeding or bruising, thoughts of suicide or self harm, memory loss  Patient-completed extensive health risk assessment - reviewed and discussed with the patient: See Health Risk Assessment completed with patient prior to the visit either above or in recent phone note. This was reviewed in detailed with the patient today and appropriate recommendations, orders and referrals were placed as needed per Summary below and patient instructions.   Review of Medical History: -PMH, PSH, Family History and current specialty and care providers reviewed and updated and listed below   Patient Care Team: Micheal Wolm ORN, MD as PCP - General (Family Medicine) Dohmeier, Dedra, MD as Consulting Physician (Neurology) Lelon Lonni BIRCH, MD as Consulting Physician (Ophthalmology) Gershon Donnice SAUNDERS, DPM as Consulting Physician (Podiatry) Liane Sharyne MATSU, [facility] (Inactive) as Pharmacist (Pharmacist)   Past Medical History:  Diagnosis Date   CEREBROVASCULAR ACCIDENT, HX OF 11/29/2010   DEPRESSION 11/29/2010   Hearing loss    HYPERLIPIDEMIA 11/29/2010   OBSTRUCTIVE SLEEP APNEA 11/29/2010   CPAP   PTSD (post-traumatic stress disorder)    Stroke [facility])     Past Surgical History:  Procedure Laterality Date   BACK SURGERY  1962   HAND SURGERY     KNEE SURGERY     SHOULDER ARTHROSCOPY WITH ROTATOR CUFF REPAIR      Social History   Socioeconomic History   Marital status: Married    Spouse name: Sonny    Number of children: 1   Years of education: college   Highest education level: Not on file  Occupational History   Occupation: part time counselor   Tobacco Use   Smoking status: Former    Current packs/day: 0.00    Average packs/day: 1.5 packs/day for 20.0 years (30.0 ttl pk-yrs)    Types: Cigarettes    Start date: 11/04/1968    Quit date: 11/04/1988    Years since quitting: 35.5   Smokeless tobacco: Never  Vaping Use    Vaping status: Never Used  Substance and Sexual Activity   Alcohol use: No    Alcohol/week: 0.0 standard drinks of alcohol   Drug use: No   Sexual activity: Not on file  Other Topics Concern   Not on file  Social History Narrative   Pts is adopted. Patient lives at home with his Sonny.   Retired but still working some.   Education some college.   Left handed.   Caffeine four cups of coffee daily.      Social Drivers of Corporate investment banker Strain: Low Risk  (09/17/2022)   Overall Financial Resource Strain (CARDIA)    Difficulty of Paying Living Expenses: Not very hard  Food Insecurity: No Food Insecurity (08/02/2021)   Hunger Vital Sign    Worried About Running Out of Food in the Last Year: Never true    Ran Out of Food in the Last Year: Never true  Transportation Needs: No Transportation Needs (05/14/2021)   PRAPARE - Administrator, Civil Service (Medical): No    Lack of Transportation (Non-Medical): No  Physical Activity: Sufficiently Active (08/02/2021)   Exercise Vital Sign    Days of Exercise per Week: 5 days    Minutes of Exercise per Session: 30 min  Stress: No Stress Concern Present (08/02/2021)   [employer] of Occupational Health - Occupational Stress Questionnaire    Feeling of Stress : Not at all  Social  Connections: Moderately Integrated (08/02/2021)   Social Connection and Isolation Panel    Frequency of Communication with Friends and Family: Three times a week    Frequency of Social Gatherings with Friends and Family: Three times a week    Attends Religious Services: More than 4 times per year    Active Member of Clubs or Organizations: No    Attends Banker Meetings: Never    Marital Status: Married  Catering manager Violence: Not At Risk (08/02/2021)   Humiliation, Afraid, Rape, and Kick questionnaire    Fear of Current or Ex-Partner: No    Emotionally Abused: No    Physically Abused: No    Sexually Abused: No     Family History  Adopted: Yes  Problem Relation Age of Onset   Breast cancer Mother    Heart failure Father     Current Outpatient Medications on File Prior to Visit  Medication Sig Dispense Refill   ARIPiprazole  (ABILIFY ) 5 MG tablet Take 1 tablet (5 mg total) by mouth daily. 30 tablet 2   busPIRone  (BUSPAR ) 7.5 MG tablet TAKE 1 TABLET(7.5 MG) BY MOUTH TWICE DAILY 60 tablet 5   clopidogrel  (PLAVIX ) 75 MG tablet TAKE 1 TABLET(75 MG) BY MOUTH DAILY 90 tablet 1   FLUoxetine  (PROZAC ) 40 MG capsule TAKE 1 CAPSULE(40 MG) BY MOUTH DAILY 90 capsule 0   Hypromellose (ARTIFICIAL TEARS OP) Place 1 drop into both eyes daily as needed (dry eyes).     sildenafil  (VIAGRA ) 50 MG tablet TAKE 1 TABLET(50 MG) BY MOUTH DAILY AS NEEDED FOR ERECTILE DYSFUNCTION 10 tablet 11   No current facility-administered medications on file prior to visit.    No Known Allergies     Physical Exam Vitals requested from patient and listed below if patient had equipment and was able to obtain at home for this virtual visit: There were no vitals filed for this visit. Estimated body mass index is 31.17 kg/m as calculated from the following:   Height as of 01/18/24: 5' 6 (1.676 m).   Weight as of 01/18/24: 193 lb 2 oz (87.6 kg).  EKG (optional): deferred due to virtual visit  GENERAL: alert, oriented, no acute distress detected; full vision exam deferred due to pandemic and/or virtual encounter  PSYCH/NEURO: pleasant and cooperative, no obvious depression or anxiety, speech and thought processing grossly intact, Cognitive function grossly intact  Flowsheet Row Office Visit from [facility]/11/2023 in [facility]  PHQ-9 Total Score 5        08/11/2023   10:31 AM 04/21/2023    4:52 PM 01/14/2023    3:23 PM 10/25/2022    9:26 AM 08/12/2022    1:35 PM  Depression screen PHQ 2/9  Decreased Interest 1 0 0 0 0  Down, Depressed, Hopeless 1 0 0 0 0  PHQ - 2 Score 2 0 0 0 0  Altered sleeping  2 0 0  0  Tired, decreased energy 1 0 1  0  Change in appetite 0 0 0  0  Feeling bad or failure about yourself  0 0 0  0  Trouble concentrating 0 0 0  0  Moving slowly or fidgety/restless 0 0 0  0  Suicidal thoughts 0 0 0  0  PHQ-9 Score 5 0 1  0  Difficult doing work/chores Not difficult at all Somewhat difficult   Not difficult at all       10/25/2022    9:27 AM 12/30/2022  2:29 PM 01/14/2023    3:25 PM 02/18/2023    2:19 PM 06/01/2024    5:58 PM  Fall Risk  Falls in the past year? 0 0 1 0 1  Was there an injury with Fall? 0 0 0 0 0  Fall Risk Category Calculator 0 0 2 0 1  Fall Risk Category (Retired) Low       (RETIRED) Patient Fall Risk Level Low fall risk       Patient at Risk for Falls Due to Other (Comment) Other (Comment) Impaired balance/gait No Fall Risks;History of fall(s)   Fall risk Follow up Falls evaluation completed  Falls evaluation completed Falls evaluation completed Falls evaluation completed Falls evaluation completed;Education provided     Data saved with a previous flowsheet row definition     SUMMARY AND PLAN:  Encounter for Medicare annual wellness exam  Other hyperlipidemia - Plan: rosuvastatin  (CRESTOR ) 40 MG tablet     Discussed applicable health maintenance/preventive health measures and advised and referred or ordered per patient preferences: -discussed vaccines due and that he could get at the pharmacy -reviewed medication list and he request refill of crestor , sent Health Maintenance  Topic Date Due   Zoster Vaccines- Shingrix (1 of 2) 04/08/1960   COVID-19 Vaccine (4 - 2024-25 season) 07/06/2023   INFLUENZA VACCINE  06/04/2024   Medicare Annual Wellness (AWV)  06/01/2025   DTaP/Tdap/Td (4 - Td or Tdap) 01/18/2034   Pneumococcal Vaccine: 50+ Years  Completed   Hepatitis B Vaccines  Aged Out   HPV VACCINES  Aged Out   Meningococcal B Vaccine  Aged Out   Hepatitis C Screening  Discontinued     Education and counseling on the  following was provided based on the above review of health and a plan/checklist for the patient, along with additional information discussed, was provided for the patient in the patient instructions :   -Provided counseling and plan for increased risk of falling if applicable per above screening. Reviewed and demonstrated safe balance exercises that can be done at home to improve balance and discussed exercise guidelines for adults with include balance exercises at least 3 days per week.  -Advised and counseled on a healthy lifestyle - including the importance of a healthy diet, regular physical activity, social connections and stress management. -Reviewed patient's current diet. Advised and counseled on a whole foods based healthy diet. A summary of a healthy diet was provided in the Patient Instructions.  -reviewed patient's current physical activity level and discussed exercise guidelines for adults. Discussed community resources and ideas for safe exercise at home to assist in meeting exercise guideline recommendations in a safe and healthy way.  -Advise yearly dental visits at minimum and regular eye exams   Follow up: see patient instructions   Patient Instructions  I really enjoyed getting to talk with you today! I am available on Tuesdays and Thursdays for virtual visits if you have any questions or concerns, or if I can be of any further assistance.   CHECKLIST FROM ANNUAL WELLNESS VISIT:  -Follow up (please call to schedule if not scheduled after visit):   -yearly for annual wellness visit with primary care office  Here is a list of your preventive care/health maintenance measures and the plan for each if any are due:  PLAN For any measures below that may be due:    1. Can get flu and covid vaccines at the pharmacy. Please let us  know if you do so that we can  update your record.   Health Maintenance  Topic Date Due   Zoster Vaccines- Shingrix (1 of 2) [birth year]1960   COVID-19  Vaccine (4 - 2024-25 season) 07/06/2023   INFLUENZA VACCINE  06/04/2024   Medicare Annual Wellness (AWV)  06/01/2025   DTaP/Tdap/Td (4 - Td or Tdap) 01/18/2034   Pneumococcal Vaccine: 50+ Years  Completed   Hepatitis B Vaccines  Aged Out   HPV VACCINES  Aged Out   Meningococcal B Vaccine  Aged Out   Hepatitis C Screening  Discontinued    -See a dentist at least yearly  -Get your eyes checked and then per your eye specialist's recommendations  -Other issues addressed today:   -I have included below further information regarding a healthy whole foods based diet, physical activity guidelines for adults, stress management and opportunities for social connections. I hope you find this information useful.   -----------------------------------------------------------------------------------------------------------------------------------------------------------------------------------------------------------------------------------------------------------    NUTRITION: -eat real food: lots of colorful vegetables (half the plate) and fruits -5-7 servings of vegetables and fruits per day (fresh or steamed is best), exp. 2 servings of vegetables with lunch and dinner and 2 servings of fruit per day. Berries and greens such as kale and collards are great choices.  -consume on a regular basis:  fresh fruits, fresh veggies, fish, nuts, seeds, healthy oils (such as olive oil, avocado oil), whole grains (make sure for bread/pasta/crackers/etc., that the first ingredient on label contains the word whole), legumes. -can eat small amounts of dairy and lean meat (no larger than the palm of your hand), but avoid processed meats such as ham, bacon, lunch meat, etc. -drink water -try to avoid fast food and pre-packaged foods, processed meat, ultra processed foods/beverages (donuts, candy, etc.) -most experts advise limiting sodium to < 2300mg  per day, should limit further is any chronic conditions such as  high blood pressure, heart disease, diabetes, etc. The American Heart Association advised that < 1500mg  is is ideal -try to avoid foods/beverages that contain any ingredients with names you do not recognize  -try to avoid foods/beverages  with added sugar or sweeteners/sweets  -try to avoid sweet drinks (including diet drinks): soda, juice, Gatorade, sweet tea, power drinks, diet drinks -try to avoid white rice, white bread, pasta (unless whole grain)  EXERCISE GUIDELINES FOR ADULTS: -if you wish to increase your physical activity, do so gradually and with the approval of your doctor -STOP and seek medical care immediately if you have any chest pain, chest discomfort or trouble breathing when starting or increasing exercise  -move and stretch your body, legs, feet and arms when sitting for long periods -Physical activity guidelines for optimal health in adults: -get at least 150 minutes per week of moderate exercise (can talk, but not sing); this is about 20-30 minutes of sustained activity 5-7 days per week or two 10-15 minute episodes of sustained activity 5-7 days per week -do some muscle building/resistance training/strength training at least 2 days per week  -balance exercises 3+ days per week:   Stand somewhere where you have something sturdy to hold onto if you lose balance    1) lift up on toes, then back down, start with 5x per day and work up to 20x   2) stand and lift one leg straight out to the side so that foot is a few inches of the floor, start with 5x each side and work up to 20x each side   3) stand on one foot, start with 5 seconds each side and work  up to 20 seconds on each side  If you need ideas or help with getting more active:  -Silver sneakers https://tools.silversneakers.com  -Walk with a Doc: http://www.duncan-williams.com/  -try to include resistance (weight lifting/strength building) and balance exercises twice per week: or the following link for  ideas: http://castillo-powell.com/  BuyDucts.dk  STRESS MANAGEMENT: -can try meditating, or just sitting quietly with deep breathing while intentionally relaxing all parts of your body for 5 minutes daily -if you need further help with stress, anxiety or depression please follow up with your primary doctor or contact the wonderful folks at [facility]: [phone]  SOCIAL CONNECTIONS: -options in [place] if you wish to engage in more social and exercise related activities:  -Silver sneakers https://tools.silversneakers.com  -Walk with a Doc: http://www.duncan-williams.com/  -Check out the [facility] Active Adults 50+ section on the [place] [employer] (hiking clubs, book clubs, cards and games, chess, exercise classes, aquatic classes and much more) - see the website for details: https://www.[place].gov/departments/parks-recreation/active-adults50  -YouTube has lots of exercise videos for different ages and abilities as well  -[facility] (a variety of indoor and outdoor inperson activities for adults). [phone]. [address].  -Virtual Online Classes (a variety of topics): see seniorplanet.org or call [phone]  -consider volunteering at a school, hospice center, church, senior center or elsewhere            Chiquita JONELLE Cramp, DO

## 2024-06-01 NOTE — Patient Instructions (Signed)
 I really enjoyed getting to talk with you today! I am available on Tuesdays and Thursdays for virtual visits if you have any questions or concerns, or if I can be of any further assistance.   CHECKLIST FROM ANNUAL WELLNESS VISIT:  -Follow up (please call to schedule if not scheduled after visit):   -yearly for annual wellness visit with primary care office  Here is a list of your preventive care/health maintenance measures and the plan for each if any are due:  PLAN For any measures below that may be due:    1. Can get flu and covid vaccines at the pharmacy. Please let us  know if you do so that we can update your record.   Health Maintenance  Topic Date Due   Zoster Vaccines- Shingrix (1 of 2) 04/08/1960   COVID-19 Vaccine (4 - 2024-25 season) 07/06/2023   INFLUENZA VACCINE  06/04/2024   Medicare Annual Wellness (AWV)  06/01/2025   DTaP/Tdap/Td (4 - Td or Tdap) 01/18/2034   Pneumococcal Vaccine: 50+ Years  Completed   Hepatitis B Vaccines  Aged Out   HPV VACCINES  Aged Out   Meningococcal B Vaccine  Aged Out   Hepatitis C Screening  Discontinued    -See a dentist at least yearly  -Get your eyes checked and then per your eye specialist's recommendations  -Other issues addressed today:   -I have included below further information regarding a healthy whole foods based diet, physical activity guidelines for adults, stress management and opportunities for social connections. I hope you find this information useful.   -----------------------------------------------------------------------------------------------------------------------------------------------------------------------------------------------------------------------------------------------------------    NUTRITION: -eat real food: lots of colorful vegetables (half the plate) and fruits -5-7 servings of vegetables and fruits per day (fresh or steamed is best), exp. 2 servings of vegetables with lunch and dinner and 2  servings of fruit per day. Berries and greens such as kale and collards are great choices.  -consume on a regular basis:  fresh fruits, fresh veggies, fish, nuts, seeds, healthy oils (such as olive oil, avocado oil), whole grains (make sure for bread/pasta/crackers/etc., that the first ingredient on label contains the word whole), legumes. -can eat small amounts of dairy and lean meat (no larger than the palm of your hand), but avoid processed meats such as ham, bacon, lunch meat, etc. -drink water -try to avoid fast food and pre-packaged foods, processed meat, ultra processed foods/beverages (donuts, candy, etc.) -most experts advise limiting sodium to < 2300mg  per day, should limit further is any chronic conditions such as high blood pressure, heart disease, diabetes, etc. The American Heart Association advised that < 1500mg  is is ideal -try to avoid foods/beverages that contain any ingredients with names you do not recognize  -try to avoid foods/beverages  with added sugar or sweeteners/sweets  -try to avoid sweet drinks (including diet drinks): soda, juice, Gatorade, sweet tea, power drinks, diet drinks -try to avoid white rice, white bread, pasta (unless whole grain)  EXERCISE GUIDELINES FOR ADULTS: -if you wish to increase your physical activity, do so gradually and with the approval of your doctor -STOP and seek medical care immediately if you have any chest pain, chest discomfort or trouble breathing when starting or increasing exercise  -move and stretch your body, legs, feet and arms when sitting for long periods -Physical activity guidelines for optimal health in adults: -get at least 150 minutes per week of moderate exercise (can talk, but not sing); this is about 20-30 minutes of sustained activity 5-7 days per week  or two 10-15 minute episodes of sustained activity 5-7 days per week -do some muscle building/resistance training/strength training at least 2 days per week  -balance  exercises 3+ days per week:   Stand somewhere where you have something sturdy to hold onto if you lose balance    1) lift up on toes, then back down, start with 5x per day and work up to 20x   2) stand and lift one leg straight out to the side so that foot is a few inches of the floor, start with 5x each side and work up to 20x each side   3) stand on one foot, start with 5 seconds each side and work up to 20 seconds on each side  If you need ideas or help with getting more active:  -Silver sneakers https://tools.silversneakers.com  -Walk with a Doc: http://www.duncan-williams.com/  -try to include resistance (weight lifting/strength building) and balance exercises twice per week: or the following link for ideas: http://castillo-powell.com/  BuyDucts.dk  STRESS MANAGEMENT: -can try meditating, or just sitting quietly with deep breathing while intentionally relaxing all parts of your body for 5 minutes daily -if you need further help with stress, anxiety or depression please follow up with your primary doctor or contact the wonderful folks at [facility]: [phone]  SOCIAL CONNECTIONS: -options in [place] if you wish to engage in more social and exercise related activities:  -Silver sneakers https://tools.silversneakers.com  -Walk with a Doc: http://www.duncan-williams.com/  -Check out the [facility] Active Adults 50+ section on the [place] of [employer] (hiking clubs, book clubs, cards and games, chess, exercise classes, aquatic classes and much more) - see the website for details: https://www.[place].gov/departments/parks-recreation/active-adults50  -YouTube has lots of exercise videos for different ages and abilities as well  -[facility] (a variety of indoor and outdoor inperson activities for adults). [phone]. [address]  -Virtual Online  Classes (a variety of topics): see seniorplanet.org or call [phone]  -consider volunteering at a school, hospice center, church, senior center or elsewhere

## 2024-06-07 ENCOUNTER — Telehealth: Payer: Self-pay

## 2024-06-07 NOTE — Telephone Encounter (Signed)
 Copied from CRM #[id number]. Topic: General - Other >> Jun 07, 2024 11:31 AM Burnard DEL wrote: Reason for CRM: Patient is requesting a phone call from provider to go over a plan that he said they discussed when he seen him regarding home health referral.

## 2024-06-07 NOTE — Telephone Encounter (Signed)
 I spoke with the patient in regard to home health referral for his wife and he reported they were contacted today by service provider and they declined services. Patient stated he could do anything that nurses would be able to do for his wife in regards to wound care. Patient requested a call from PCP to discuss a personal issue. I attempted to gather additional details but patient declined to share any information.

## 2024-06-08 NOTE — Telephone Encounter (Signed)
 Left a message for the patient to return my call.

## 2024-06-09 NOTE — Telephone Encounter (Signed)
 I spoke with the patient's husband and f/u has been scheduled for his wife

## 2024-07-08 ENCOUNTER — Ambulatory Visit: Admitting: Podiatry

## 2024-07-27 ENCOUNTER — Emergency Department (HOSPITAL_COMMUNITY)

## 2024-07-27 ENCOUNTER — Encounter (HOSPITAL_COMMUNITY): Payer: Self-pay

## 2024-07-27 ENCOUNTER — Other Ambulatory Visit: Payer: Self-pay

## 2024-07-27 ENCOUNTER — Emergency Department (HOSPITAL_COMMUNITY)
Admission: EM | Admit: 2024-07-27 | Discharge: 2024-07-27 | Disposition: A | Discharge status: Home / Self Care | Attending: Emergency Medicine | Admitting: Emergency Medicine

## 2024-07-27 ENCOUNTER — Ambulatory Visit: Payer: Self-pay

## 2024-07-27 DIAGNOSIS — R0789 Other chest pain: Secondary | ICD-10-CM | POA: Insufficient documentation

## 2024-07-27 DIAGNOSIS — R079 Chest pain, unspecified: Secondary | ICD-10-CM

## 2024-07-27 DIAGNOSIS — Z7901 Long term (current) use of anticoagulants: Secondary | ICD-10-CM | POA: Insufficient documentation

## 2024-07-27 LAB — CBC
HCT: 42.5 % (ref 39.0–52.0)
Hemoglobin: 14.3 g/dL (ref 13.0–17.0)
MCH: 33.3 pg (ref 26.0–34.0)
MCHC: 33.6 g/dL (ref 30.0–36.0)
MCV: 99.1 fL (ref 80.0–100.0)
Platelets: 211 K/uL (ref 150–400)
RBC: 4.29 MIL/uL (ref 4.22–5.81)
RDW: 13.9 % (ref 11.5–15.5)
WBC: 4.3 K/uL (ref 4.0–10.5)
nRBC: 0 % (ref 0.0–0.2)

## 2024-07-27 LAB — BASIC METABOLIC PANEL WITH GFR
Anion gap: 12 (ref 5–15)
BUN: 9 mg/dL (ref 8–23)
CO2: 23 mmol/L (ref 22–32)
Calcium: 8.6 mg/dL — ABNORMAL LOW (ref 8.9–10.3)
Chloride: 97 mmol/L — ABNORMAL LOW (ref 98–111)
Creatinine, Ser: 0.63 mg/dL (ref 0.61–1.24)
GFR, Estimated: >60 mL/min (ref >60)
Glucose, Bld: 111 mg/dL — ABNORMAL HIGH (ref 70–99)
Potassium: 4 mmol/L (ref 3.5–5.1)
Sodium: 132 mmol/L — ABNORMAL LOW (ref 135–145)

## 2024-07-27 LAB — TROPONIN T, HIGH SENSITIVITY
Troponin T High Sensitivity: <15 ng/L (ref 0–19)
Troponin T High Sensitivity: <15 ng/L (ref 0–19)

## 2024-07-27 LAB — CBG MONITORING, ED: Glucose-Capillary: 114 mg/dL — ABNORMAL HIGH (ref 70–99)

## 2024-07-27 MED ORDER — ACETAMINOPHEN 500 MG PO TABS
1000.0000 mg | ORAL_TABLET | Freq: Once | ORAL | Status: AC
  Administered 2024-07-27: 1000 mg via ORAL
  Filled 2024-07-27: qty 2

## 2024-07-27 NOTE — Discharge Instructions (Signed)
 While you are in the emergency room, your blood work and x-rays done that were all normal.  At this time, I do not a clear cause for your symptoms but does not appear to be an emergency at this time.  Please follow-up with your primary care doctor within 1 week.  Return to the emergency department for new or worsening symptoms.

## 2024-07-27 NOTE — ED Triage Notes (Signed)
 Pt reports with chest pain and dizziness that started 3 hrs ago. Pt states that the pain goes across his chest.

## 2024-07-27 NOTE — ED Provider Notes (Signed)
 [place] EMERGENCY DEPARTMENT AT [facility] Provider Note  CSN: [id number] Arrival date & time: 07/27/24 1451  Chief Complaint(s) Chest Pain  HPI Richard Fernandez is a 83 y.o. male who is here today for chest pain.  Patient reports that he has been intermittently having some pain of the chest that seems to come and go.  He has not identified any exacerbating events.  Has not felt short of breath, has not noted any lower extremity swelling.  He states that he has had worse balance over the last couple of months.   Past Medical History Past Medical History:  Diagnosis Date   CEREBROVASCULAR ACCIDENT, HX OF 11/29/2010   DEPRESSION 11/29/2010   Hearing loss    HYPERLIPIDEMIA 11/29/2010   OBSTRUCTIVE SLEEP APNEA 11/29/2010   CPAP   PTSD (post-traumatic stress disorder)    Stroke [facility])    Patient Active Problem List   Diagnosis Date Noted   B12 deficiency 02/10/2023   SCCA (squamous cell carcinoma) of skin 08/11/2019   Bilateral foot-drop 02/24/2018   Left-sided weakness 10/07/2016   Weakness of left arm 10/07/2016   PTSD (post-traumatic stress disorder)    Bipolar I disorder (HCC)    History of stroke    Morbid obesity due to excess calories (HCC) 12/27/2015   OSA on CPAP 12/27/2015   Memory difficulty 12/27/2015   Obesity (BMI 30-39.9) 10/18/2013   Hyperlipidemia 11/29/2010   Depression 11/29/2010   Obstructive sleep apnea 11/29/2010   History of cardiovascular disorder 11/29/2010   Home Medication(s) Prior to Admission medications   Medication Sig Start Date End Date Taking? Authorizing Provider  ARIPiprazole  (ABILIFY ) 5 MG tablet Take 1 tablet (5 mg total) by mouth daily. 10/27/23   Burchette, Wolm ORN, MD  busPIRone  (BUSPAR ) 7.5 MG tablet TAKE 1 TABLET(7.5 MG) BY MOUTH TWICE DAILY 01/03/23   Burchette, Wolm ORN, MD  clopidogrel  (PLAVIX ) 75 MG tablet TAKE 1 TABLET(75 MG) BY MOUTH DAILY 07/01/23   Burchette, Wolm ORN, MD  FLUoxetine  (PROZAC ) 40 MG capsule TAKE 1  CAPSULE(40 MG) BY MOUTH DAILY 02/18/24   Burchette, Wolm ORN, MD  Hypromellose (ARTIFICIAL TEARS OP) Place 1 drop into both eyes daily as needed (dry eyes).    [provider]  rosuvastatin  (CRESTOR ) 40 MG tablet TAKE 1 TABLET(40 MG) BY MOUTH DAILY 06/01/24   Luke Chiquita SAUNDERS, DO  sildenafil  (VIAGRA ) 50 MG tablet TAKE 1 TABLET(50 MG) BY MOUTH DAILY AS NEEDED FOR ERECTILE DYSFUNCTION 08/26/23   Burchette, Wolm ORN, MD                                                                                                                                    Past Surgical History Past Surgical History:  Procedure Laterality Date   BACK SURGERY  1962   HAND SURGERY     KNEE SURGERY     SHOULDER ARTHROSCOPY WITH ROTATOR CUFF REPAIR  Family History Family History  Adopted: Yes  Problem Relation Age of Onset   Breast cancer Mother    Heart failure Father     Social History Social History   Tobacco Use   Smoking status: Former    Current packs/day: 0.00    Average packs/day: 1.5 packs/day for 20.0 years (30.0 ttl pk-yrs)    Types: Cigarettes    Start date: 11/04/1968    Quit date: 11/04/1988    Years since quitting: 35.7   Smokeless tobacco: Never  Vaping Use   Vaping status: Never Used  Substance Use Topics   Alcohol use: No    Alcohol/week: 0.0 standard drinks of alcohol   Drug use: No   Allergies Patient has no known allergies.  Review of Systems Review of Systems  Physical Exam Vital Signs  I have reviewed the triage vital signs BP 121/70   Pulse 60   Temp 98.1 F (36.7 C) (Oral)   Resp 16   SpO2 98%   Physical Exam Vitals and nursing note reviewed.  Cardiovascular:     Rate and Rhythm: Normal rate.     Heart sounds: Normal heart sounds.  Pulmonary:     Effort: Pulmonary effort is normal.     Breath sounds: Normal breath sounds.  Chest:     Chest wall: No mass or tenderness.  Abdominal:     Palpations: Abdomen is soft.  Musculoskeletal:     Right lower leg: No  edema.     Left lower leg: No edema.  Skin:    General: Skin is warm.  Neurological:     Mental Status: He is alert.     Comments: Bilateral resting tremor in hands.  Normal speech, no facial droop.     ED Results and Treatments Labs (all labs ordered are listed, but only abnormal results are displayed) Labs Reviewed  BASIC METABOLIC PANEL WITH GFR - Abnormal; Notable for the following components:      Result Value   Sodium 132 (*)    Chloride 97 (*)    Glucose, Bld 111 (*)    Calcium  8.6 (*)    All other components within normal limits  CBG MONITORING, ED - Abnormal; Notable for the following components:   Glucose-Capillary 114 (*)    All other components within normal limits  CBC  TROPONIN T, HIGH SENSITIVITY  TROPONIN T, HIGH SENSITIVITY                                                                                                                          Radiology DG Chest 2 View Result Date: 07/27/2024 EXAM: 2 VIEW(S) XRAY OF THE CHEST 07/27/2024 03:11:59 PM COMPARISON: 08/11/2023 CLINICAL HISTORY: chest pain. Per chart - Pt reports with chest pain and dizziness that started 3 hrs ago. Pt states that the pain goes across his chest. FINDINGS: LUNGS AND PLEURA: No focal pulmonary opacity. No pulmonary edema. No pleural effusion. No pneumothorax. HEART AND MEDIASTINUM: No  acute abnormality of the cardiac and mediastinal silhouettes. BONES AND SOFT TISSUES: No acute osseous abnormality. IMPRESSION: 1. No acute abnormalities. Electronically signed by: Selinda Blue MD 07/27/2024 03:36 PM EDT RP Workstation: [id number]    Pertinent labs & imaging results that were available during my care of the patient were reviewed by me and considered in my medical decision making (see MDM for details).  Medications Ordered in ED Medications  acetaminophen  (TYLENOL ) tablet 1,000 mg (1,000 mg Oral Given 07/27/24 1720)                                                                                                                                      Procedures Procedures  (including critical care time)  Medical Decision Making / ED Course   This patient presents to the ED for concern of chest pain, this involves an extensive number of treatment options, and is a complaint that carries with it a high risk of complications and morbidity.  The differential diagnosis includes ACS, pleurisy, less likely PE, less likely acute CVA.  Less likely pneumonia.  MDM: Patient overall well-appearing.  Has clear lung sounds, normal vital signs.  His initial EKG shows no evidence of acute ischemia.  Pending troponin.  Patient with some residual deficits from prior CVA.  Sounds that has been having some balance trouble over the last couple of months.  Believe this is more appropriate for outpatient workup.  Will investigate emergent causes for the patient's chest pain.  Reassessment 7:10 PM-patient with 2 negative delta troponins.  At this time, do not believe patient has any emergent cause for his symptoms.  Believe he is appropriate for outpatient follow-up.  Additional history obtained: -Additional history obtained from wife at bedside -External records from outside source obtained and reviewed including: Chart review including previous notes, labs, imaging, consultation notes   Lab Tests: -I ordered, reviewed, and interpreted labs.   The pertinent results include:   Labs Reviewed  BASIC METABOLIC PANEL WITH GFR - Abnormal; Notable for the following components:      Result Value   Sodium 132 (*)    Chloride 97 (*)    Glucose, Bld 111 (*)    Calcium  8.6 (*)    All other components within normal limits  CBG MONITORING, ED - Abnormal; Notable for the following components:   Glucose-Capillary 114 (*)    All other components within normal limits  CBC  TROPONIN T, HIGH SENSITIVITY  TROPONIN T, HIGH SENSITIVITY      EKG my independent review of the patient's EKG shows no ST segment  depressions or elevations, no T wave inversions, no evidence of acute ischemia.  EKG Interpretation Date/Time:  Tuesday July 27 2024 15:03:04 EDT Ventricular Rate:  74 PR Interval:    QRS Duration:  101 QT Interval:  394 QTC Calculation: 438 R Axis:   51  Text Interpretation: Normal sinus rhythm Poor quality tracing  Confirmed by Mannie Pac [phone]) on 07/27/2024 3:47:07 PM         Imaging Studies ordered: I ordered imaging studies including chest x-ray I independently visualized and interpreted imaging. I agree with the radiologist interpretation   Medicines ordered and prescription drug management: Meds ordered this encounter  Medications   acetaminophen  (TYLENOL ) tablet 1,000 mg    -I have reviewed the patients home medicines and have made adjustments as needed   Cardiac Monitoring: The patient was maintained on a cardiac monitor.  I personally viewed and interpreted the cardiac monitored which showed an underlying rhythm of: Normal sinus rhythm  Social Determinants of Health:  Factors impacting patients care include: Lack of access to primary care   Reevaluation: After the interventions noted above, I reevaluated the patient and found that they have :improved  Co morbidities that complicate the patient evaluation  Past Medical History:  Diagnosis Date   CEREBROVASCULAR ACCIDENT, HX OF 11/29/2010   DEPRESSION 11/29/2010   Hearing loss    HYPERLIPIDEMIA 11/29/2010   OBSTRUCTIVE SLEEP APNEA 11/29/2010   CPAP   PTSD (post-traumatic stress disorder)    Stroke [facility])       Dispostion: I considered admission for this patient, however with his reassuring workup he is appropriate for discharge.     Final Clinical Impression(s) / ED Diagnoses Final diagnoses:  Nonspecific chest pain     @PCDICTATION @    Mannie Pac DASEN, DO 07/27/24 1917

## 2024-07-27 NOTE — Telephone Encounter (Signed)
 FYI Only or Action Required?: Action required by provider: refused ER.  Patient was last seen in primary care on 06/01/2024 by Richard Chiquita SAUNDERS, DO.  Called Nurse Triage reporting Dizziness.  Symptoms began today.  Interventions attempted: Rest, hydration, or home remedies.  Symptoms are: gradually worsening.  Triage Disposition: Go to ED Now (or PCP Triage)  Patient/caregiver understands and will follow disposition?: No, refuses disposition   Copied from CRM #[id number]. Topic: Clinical - Red Word Triage >> Jul 27, 2024 12:15 PM Franky GRADE wrote: Red Word that prompted transfer to Nurse Triage: Patient has been experiencing dizziness spells on and off for the last 45 min. Reason for Disposition  SEVERE dizziness (e.g., unable to stand, requires support to walk, feels like passing out now)  Answer Assessment - Initial Assessment Questions Additional info: Refused ER due to wait times, states he brought his wife recently and she wasn't evaluated for 8 hours. States I will stick it out and if not getting better I will call you back. Reinforced ED disposition, patient states he will go if he develops other symptoms such as headache, vision changes, tingling, numbness.      1. DESCRIPTION: Describe your dizziness.     Lightheaded  2. LIGHTHEADED: Do you feel lightheaded? (e.g., somewhat faint, woozy, weak upon standing)     Feels faint 3. VERTIGO: Do you feel like either you or the room is spinning or tilting? (i.e., vertigo)     Feels room is spinning 4. SEVERITY: How bad is it?  Do you feel like you are going to faint? Can you stand and walk?     Feels he would faint if he stands up 5. ONSET:  When did the dizziness begin?     45 minutes 6. AGGRAVATING FACTORS: Does anything make it worse? (e.g., standing, change in head position)     Movement 7. HEART RATE: Can you tell me your heart rate? How many beats in 15 seconds?  (Note: Not all patients can do this.)        Feels normal, does not measure 8. CAUSE: What do you think is causing the dizziness? (e.g., decreased fluids or food, diarrhea, emotional distress, heat exposure, new medicine, sudden standing, vomiting; unknown)     unsure 9. RECURRENT SYMPTOM: Have you had dizziness before? If Yes, ask: When was the last time? What happened that time?     never 10. OTHER SYMPTOMS: Do you have any other symptoms? (e.g., fever, chest pain, vomiting, diarrhea, bleeding)       Occasional extremity tingling-not now  Protocols used: Dizziness - Lightheadedness-A-AH

## 2024-08-04 ENCOUNTER — Encounter: Payer: Self-pay | Admitting: Family Medicine

## 2024-08-04 ENCOUNTER — Ambulatory Visit: Admitting: Family Medicine

## 2024-08-04 VITALS — BP 110/70 | HR 71 | Temp 98.0°F | Wt 176.3 lb

## 2024-08-04 DIAGNOSIS — R079 Chest pain, unspecified: Secondary | ICD-10-CM | POA: Diagnosis not present

## 2024-08-04 DIAGNOSIS — R251 Tremor, unspecified: Secondary | ICD-10-CM

## 2024-08-04 DIAGNOSIS — Z599 Problem related to housing and economic circumstances, unspecified: Secondary | ICD-10-CM | POA: Diagnosis not present

## 2024-08-04 NOTE — Progress Notes (Signed)
 Established Patient Office Visit  Subjective   Patient ID: Richard Fernandez, male    DOB: 1940/12/12  Age: 83 y.o. MRN: [id number]  Chief Complaint  Patient presents with   Hospitalization Follow-up    HPI   Richard Fernandez has history of obstructive sleep apnea, history of skin cancer, bipolar 1 disorder, past history of CVA, hyperlipidemia, cognitive impairment, history of PTSD.  Is seen today for scheduled follow-up after recent ER visit on 9/23 for chest pain.  Chest x-ray unremarkable.  Troponins negative.  EKG sinus rhythm with no acute changes.  He states he had no chest pain whatsoever since then.  Generally fairly sedentary.  CBC and basic metabolic panel unremarkable except for mild hyponatremia with sodium 132.  Kidney function stable.  No known history of coronary disease.  No recent exertional symptoms.  Non-smoker-quit 1990  Patient relates progressive tremor over the past year.  Tremor is worse at rest and actually abolishes somewhat with movement.  Friend who is with him today thinks his gait may have changed some over the past year.  He is adopted so family history is unknown  Patient relates excessive worries and stress recently.  They have financial concerns.  He is on very limited income with so security from him and his wife.  She has special needs fleeta related to her chronic disabilities and they have to pay several $100 a month for that.  They also rent their current residence.  They do have some food concerns at times.  Patient is receptive to possible social consult.  Past Medical History:  Diagnosis Date   CEREBROVASCULAR ACCIDENT, HX OF 11/29/2010   DEPRESSION 11/29/2010   Hearing loss    HYPERLIPIDEMIA 11/29/2010   OBSTRUCTIVE SLEEP APNEA 11/29/2010   CPAP   PTSD (post-traumatic stress disorder)    Stroke [facility])    Past Surgical History:  Procedure Laterality Date   BACK SURGERY  1962   HAND SURGERY     KNEE SURGERY     SHOULDER ARTHROSCOPY WITH ROTATOR CUFF REPAIR       reports that he quit smoking about 35 years ago. His smoking use included cigarettes. He started smoking about 55 years ago. He has a 30 pack-year smoking history. He has never used smokeless tobacco. He reports that he does not drink alcohol and does not use drugs. family history includes Breast cancer in his mother; Heart failure in his father. He was adopted. No Known Allergies  Review of Systems  Constitutional:  Negative for chills and fever.  Eyes:  Negative for blurred vision.  Respiratory:  Negative for cough, shortness of breath and wheezing.   Cardiovascular:        See HPI  Neurological:  Positive for tremors. Negative for dizziness, speech change, focal weakness, seizures, loss of consciousness and headaches.      Objective:     BP 110/70   Pulse 71   Temp 98 F (36.7 C) (Oral)   Wt 176 lb 4.8 oz (80 kg)   SpO2 96%   BMI 28.46 kg/m  BP Readings from Last 3 Encounters:  08/04/24 110/70  07/27/24 121/70  01/19/24 111/70   Wt Readings from Last 3 Encounters:  08/04/24 176 lb 4.8 oz (80 kg)  01/18/24 193 lb 2 oz (87.6 kg)  11/14/23 193 lb 3.2 oz (87.6 kg)      Physical Exam Vitals reviewed.  Constitutional:      General: He is not in acute distress.  Appearance: He is well-developed. He is not ill-appearing.  Eyes:     Pupils: Pupils are equal, round, and reactive to light.  Neck:     Thyroid : No thyromegaly.  Cardiovascular:     Rate and Rhythm: Normal rate and regular rhythm.  Pulmonary:     Effort: Pulmonary effort is normal. No respiratory distress.     Breath sounds: Normal breath sounds. No wheezing or rales.  Musculoskeletal:     Cervical back: Neck supple.  Neurological:     Mental Status: He is alert.     Cranial Nerves: No cranial nerve deficit.     Comments: He has significant pill-rolling type upper extremity tremor involving right and left upper extremity.  Does abolish some with activity. General loss of facial expression.  No  significant cogwheel rigidity      No results found for any visits on 08/04/24.  Last CBC Lab Results  Component Value Date   WBC 4.3 07/27/2024   HGB 14.3 07/27/2024   HCT 42.5 07/27/2024   MCV 99.1 07/27/2024   MCH 33.3 07/27/2024   RDW 13.9 07/27/2024   PLT 211 07/27/2024   Last metabolic panel Lab Results  Component Value Date   GLUCOSE 111 (H) 07/27/2024   NA 132 (L) 07/27/2024   K 4.0 07/27/2024   CL 97 (L) 07/27/2024   CO2 23 07/27/2024   BUN 9 07/27/2024   CREATININE 0.63 07/27/2024   GFRNONAA >60 07/27/2024   CALCIUM  8.6 (L) 07/27/2024   PROT 6.7 11/14/2023   ALBUMIN 3.9 11/14/2023   BILITOT 0.5 11/14/2023   ALKPHOS 60 11/14/2023   AST 18 11/14/2023   ALT 16 11/14/2023   ANIONGAP 12 07/27/2024   Last lipids Lab Results  Component Value Date   CHOL 212 (H) 11/14/2023   HDL 61.10 11/14/2023   LDLCALC 140 (H) 11/14/2023   TRIG 58.0 11/14/2023   CHOLHDL 3 11/14/2023   Last hemoglobin A1c Lab Results  Component Value Date   HGBA1C (H) 05/20/2010    5.8 (NOTE)                                                                       According to the ADA Clinical Practice Recommendations for 2011, when HbA1c is used as a screening test:   >=6.5%   Diagnostic of Diabetes Mellitus           (if abnormal result  is confirmed)  5.7-6.4%   Increased risk of developing Diabetes Mellitus  References:Diagnosis and Classification of Diabetes Mellitus,Diabetes Care,2011,34(Suppl 1):S62-S69 and Standards of Medical Care in         Diabetes - 2011,Diabetes Care,2011,34  (Suppl 1):S11-S61.   Last thyroid  functions Lab Results  Component Value Date   TSH 4.06 08/11/2023      The ASCVD Risk score (Arnett DK, et al., 2019) failed to calculate for the following reasons:   The 2019 ASCVD risk score is only valid for ages 101 to 11   Risk score cannot be calculated because patient has a medical history suggesting prior/existing ASCVD    Assessment & Plan:    #1  resting tremor.  Concern for possible Parkinson's disease.  Tremor does improve some with intentional activity.  Patient states  this is particularly, worsened over the past year.  Set up neurology referral  #2 recent acute chest pain.  Seen in ER with workup as above.  No further chest pain episodes.  Observe for now  #3 financial concerns.  Patient stresses monthly regarding their bills including rent and car payment.  He has significant worries about other basic necessities such as food.  Will set up social worker consult to see if there are any available resources to help him through this.  No follow-ups on file.    Wolm Scarlet, MD

## 2024-08-05 ENCOUNTER — Telehealth: Payer: Self-pay | Admitting: *Deleted

## 2024-08-05 NOTE — Progress Notes (Unsigned)
 Complex Care Management Note Care Guide Note  08/05/2024 Name: Richard Fernandez MRN: [id number] DOB: 10-16-1941   Complex Care Management Outreach Attempts: An unsuccessful telephone outreach was attempted today to offer the patient information about available complex care management services.  Follow Up Plan:  Additional outreach attempts will be made to offer the patient complex care management information and services.   Encounter Outcome:  No Answer  [name], CMA [place]  [facility], [facility] Guide Direct Dial: [phone]  Fax: [phone] Website: [email or web address]

## 2024-08-06 NOTE — Progress Notes (Signed)
 Complex Care Management Note  Care Guide Note 08/06/2024 Name: Huston Stonehocker MRN: [id number] DOB: 05-28-1941  Diamonte Stavely is a 83 y.o. year old male who sees Burchette, Wolm ORN, MD for primary care. I reached out to Debby Leech by phone today to offer complex care management services.  Mr. Blomquist was given information about Complex Care Management services today including:   The Complex Care Management services include support from the care team which includes your Nurse Care Manager, Clinical Social Worker, or Pharmacist.  The Complex Care Management team is here to help remove barriers to the health concerns and goals most important to you. Complex Care Management services are voluntary, and the patient may decline or stop services at any time by request to their care team member.   Complex Care Management Consent Status: Patient agreed to services and verbal consent obtained.   Follow up plan:  Telephone appointment with complex care management team member scheduled for:  08/10/2024 and 08/13/2024  Encounter Outcome:  Patient Scheduled  [name], CMA [place]  [facility], [facility] Guide Direct Dial: [phone]  Fax: [phone] Website: [email or web address]

## 2024-08-10 ENCOUNTER — Telehealth: Payer: Self-pay

## 2024-08-10 NOTE — Patient Instructions (Signed)
 Debby Leech - I am sorry I was unable to reach you today for our scheduled appointment. I work with Micheal, Wolm ORN, MD and am calling to support your healthcare needs. Please contact me at [phone] at your earliest convenience. I look forward to speaking with you soon.   Thank you,  Tillman Gardener, BSW   [facility], [facility] Social Worker Direct Dial: [phone]  Fax: [phone] Website: [email or web address]

## 2024-08-13 ENCOUNTER — Other Ambulatory Visit: Payer: Self-pay

## 2024-08-17 ENCOUNTER — Telehealth: Payer: Self-pay | Admitting: *Deleted

## 2024-08-17 NOTE — Progress Notes (Signed)
 Complex Care Management Care Guide Note  08/17/2024 Name: Richard Fernandez MRN: [id number] DOB: January 14, 1941  [name] is a 83 y.o. year old male who is a primary care patient of Burchette, Wolm ORN, MD and is actively engaged with the care management team. I reached out to Debby Leech by phone today to assist with re-scheduling  with the BSW.  Follow up plan: pt declined to reschedule with BSW   [name], CMA [facility]  [facility], [facility] Guide Direct Dial: [phone]  Fax: [phone] Website: [email or web address]

## 2024-09-27 ENCOUNTER — Ambulatory Visit: Payer: Self-pay

## 2024-09-27 NOTE — Telephone Encounter (Signed)
 Agree with advice for ER evaluation  Marquetta Sit MD [facility]

## 2024-09-27 NOTE — Telephone Encounter (Signed)
 FYI Only or Action Required?: Action required by provider: request for appointment.- refused appt for today, Rn recommended ER. He refused due to wife being upset with him.   Patient was last seen in primary care on 08/04/2024 by Richard Fernandez ORN, MD.  Called Nurse Triage reporting Dizziness and Fatigue.  Symptoms began today.  Interventions attempted: Nothing.  Symptoms are: unchanged.  Triage Disposition: See HCP Within 4 Hours (Or PCP Triage)  Patient/caregiver understands and will follow disposition?: No, refuses disposition      Copied from CRM #[id number]. Topic: Clinical - Red Word Triage >> Sep 27, 2024 12:49 PM [name] wrote: [facility] that prompted transfer to Nurse Triage: dizziness pt is not feeling good, losing weigh, stomach bloated, cant energy back, no pain, dizziness Reason for Disposition  [1] MODERATE weakness (e.g., interferes with work, school, normal activities) AND [2] cause unknown  (Exceptions: Weakness from acute minor illness or poor fluid intake; weakness is chronic and not worse.)  Answer Assessment - Initial Assessment Questions P states he hasn't been feel well today or last night. His wife noticed that he has been losing weight as his clothes are not fitting well. Pt denies any pain, just pain, shortness of breath, swelling, numbness or weakness, nasuea, vomiting or diarrhea. He did say he was dizzy but when asked to explain it states he just feels blah. He can't describe it, but denied feeling like room was spinning or like he was going to pass out. He states he just doesn't feel right. Rn attempted to make and appt at the office today. Pt had RN on speaker phone and wife was audibly upset. She states she was not bringing him to an appt today. Yelled at him that he could take a taxi. Pt states his wife is upset with him and it's his fault. Rn advised if refusing an appt, would recommend ER, Rn offered to call 911. Pt refused all things. Is not sure when he  will have a ride.     1. DESCRIPTION: Describe how you are feeling.     Blah 2. SEVERITY: How bad is it?  Can you stand and walk?     States he can walk.  3. ONSET: When did these symptoms begin? (e.g., hours, days, weeks, months)     States today, maybe a little last night.  4. CAUSE: What do you think is causing the weakness or fatigue? (e.g., not drinking enough fluids, medical problem, trouble sleeping)     unknown 7 OTHER SYMPTOMS: Do you have any other symptoms? (e.g., chest pain, fever, cough, SOB, vomiting, diarrhea, bleeding, other areas of pain)     Denies all symptoms.  Protocols used: Weakness (Generalized) and Fatigue-A-AH

## 2024-10-20 ENCOUNTER — Telehealth: Payer: Self-pay | Admitting: *Deleted

## 2024-10-20 NOTE — Telephone Encounter (Signed)
 Copied from CRM #[id number]. Topic: Clinical - Medication Question >> Oct 20, 2024  2:49 PM Carlyon D wrote: Reason for CRM: pt is calling in regards to weight loss he is wanting to speak to someone and would like a call back. [phone]

## 2024-10-20 NOTE — Telephone Encounter (Signed)
 Appt scheduled for 10/25/2024

## 2024-10-25 ENCOUNTER — Ambulatory Visit (INDEPENDENT_AMBULATORY_CARE_PROVIDER_SITE_OTHER): Admitting: Family Medicine

## 2024-10-25 ENCOUNTER — Encounter: Payer: Self-pay | Admitting: Family Medicine

## 2024-10-25 VITALS — BP 110/70 | HR 71 | Temp 97.6°F | Wt 170.0 lb

## 2024-10-25 DIAGNOSIS — R634 Abnormal weight loss: Secondary | ICD-10-CM | POA: Diagnosis not present

## 2024-10-25 NOTE — Progress Notes (Signed)
 "  Established Patient Office Visit  Subjective   Patient ID: Richard Fernandez, male    DOB: 07/10/1941  Age: 83 y.o. MRN: [id number]  Chief Complaint  Patient presents with   Weight Loss    HPI   Mr. Mccannon has history of obstructive sleep apnea, squamous cell skin cancer, B12 deficiency, bipolar 1 disorder, history of CVA, hyperlipidemia, cognitive impairment, and PTSD.  Is seen today with some unintentional weight loss.  His weight is down about 18 pounds compared to last year and 6 pounds from last visit.  History is somewhat difficult from patient and his wife but apparently they generally only eat about 2 meals per day.  He states his appetite is fair.  Wife states that he seems to be eating similarly but is losing weight.  Denies any headaches, difficulty swallowing, chronic cough, chest pains, abdominal pain, change in stool habits, gross hematuria, night sweats, lymphadenopathy.  No new medications.  He feels like his depression is relatively stable.  Has been on combination therapy with Abilify  and Prozac  for years.  Past Medical History:  Diagnosis Date   CEREBROVASCULAR ACCIDENT, HX OF 11/29/2010   DEPRESSION 11/29/2010   Hearing loss    HYPERLIPIDEMIA 11/29/2010   OBSTRUCTIVE SLEEP APNEA 11/29/2010   CPAP   PTSD (post-traumatic stress disorder)    Stroke [facility])    Past Surgical History:  Procedure Laterality Date   BACK SURGERY  1962   HAND SURGERY     KNEE SURGERY     SHOULDER ARTHROSCOPY WITH ROTATOR CUFF REPAIR      reports that he quit smoking about 35 years ago. His smoking use included cigarettes. He started smoking about 56 years ago. He has a 30 pack-year smoking history. He has never used smokeless tobacco. He reports that he does not drink alcohol and does not use drugs. family history includes Breast cancer in his mother; Heart failure in his father. He was adopted. Allergies[1]  Review of Systems  Constitutional:  Positive for weight loss. Negative for fever.   Respiratory:  Negative for cough and shortness of breath.   Cardiovascular:  Negative for chest pain.  Gastrointestinal:  Negative for abdominal pain, blood in stool, diarrhea, nausea and vomiting.  Genitourinary:  Negative for hematuria.  Neurological:  Positive for tremors.  Psychiatric/Behavioral:  Negative for depression.       Objective:     BP 110/70   Pulse 71   Temp 97.6 F (36.4 C) (Oral)   Wt 170 lb (77.1 kg)   SpO2 98%   BMI 27.44 kg/m  BP Readings from Last 3 Encounters:  10/25/24 110/70  08/04/24 110/70  07/27/24 121/70   Wt Readings from Last 3 Encounters:  10/25/24 170 lb (77.1 kg)  08/04/24 176 lb 4.8 oz (80 kg)  01/18/24 193 lb 2 oz (87.6 kg)      Physical Exam Vitals reviewed.  Cardiovascular:     Rate and Rhythm: Normal rate and regular rhythm.  Pulmonary:     Effort: Pulmonary effort is normal.     Breath sounds: Normal breath sounds.  Abdominal:     General: There is no distension.     Palpations: Abdomen is soft.     Tenderness: There is no abdominal tenderness.  Musculoskeletal:     Cervical back: Neck supple.     Right lower leg: No edema.     Left lower leg: No edema.  Lymphadenopathy:     Cervical: No cervical adenopathy.  Neurological:  Mental Status: He is alert.      No results found for any visits on 10/25/24.    The ASCVD Risk score (Arnett DK, et al., 2019) failed to calculate for the following reasons:   The 2019 ASCVD risk score is only valid for ages 52 to 70   Risk score cannot be calculated because patient has a medical history suggesting prior/existing ASCVD   * - Cholesterol units were assumed    Assessment & Plan:   Problem List Items Addressed This Visit   None Visit Diagnoses       Weight loss, unintentional    -  Primary   Relevant Orders   CBC with Differential/Platelet   TSH   CMP   Sedimentation rate     History of plan has chronic issues as above including cognitive impairment who is  seen with unintentional weight loss.  Denies any localizing symptoms.  Exam is nonfocal.  -Recommend checking additional labs above including CBC, TSH, CMP, sed rate - Recommend regular supplement with boost or Ensure and perhaps adding extra meal or high-calorie nutritious snack per day. - Set up follow-up in couple months to reassess.  If still losing weight at that point consider imaging such as CT abdomen pelvis - We did try to explore social aspects of his weight loss and both he and his wife deny that they have any financial constraints with getting adequate groceries currently.  Return in about 3 months (around 01/23/2025).    Wolm Scarlet, MD     [1] No Known Allergies  "

## 2024-10-25 NOTE — Patient Instructions (Signed)
 Consider nutrition supplement daily such as Boost or Ensure.

## 2024-10-26 ENCOUNTER — Ambulatory Visit: Payer: Self-pay | Admitting: Family Medicine

## 2024-10-26 LAB — COMPREHENSIVE METABOLIC PANEL WITH GFR
ALT: 14 U/L (ref 3–53)
AST: 17 U/L (ref 5–37)
Albumin: 4.1 g/dL (ref 3.5–5.2)
Alkaline Phosphatase: 54 U/L (ref 39–117)
BUN: 8 mg/dL (ref 6–23)
CO2: 27 meq/L (ref 19–32)
Calcium: 8.4 mg/dL (ref 8.4–10.5)
Chloride: 94 meq/L — ABNORMAL LOW (ref 96–112)
Creatinine, Ser: 0.6 mg/dL (ref 0.40–1.50)
GFR: 89.24 mL/min
Glucose, Bld: 98 mg/dL (ref 70–99)
Potassium: 3.9 meq/L (ref 3.5–5.1)
Sodium: 130 meq/L — ABNORMAL LOW (ref 135–145)
Total Bilirubin: 0.5 mg/dL (ref 0.2–1.2)
Total Protein: 6.8 g/dL (ref 6.0–8.3)

## 2024-10-26 LAB — CBC WITH DIFFERENTIAL/PLATELET
Basophils Absolute: 0 K/uL (ref 0.0–0.1)
Basophils Relative: 0.7 % (ref 0.0–3.0)
Eosinophils Absolute: 0 K/uL (ref 0.0–0.7)
Eosinophils Relative: 0.2 % (ref 0.0–5.0)
HCT: 42.1 % (ref 39.0–52.0)
Hemoglobin: 14.4 g/dL (ref 13.0–17.0)
Lymphocytes Relative: 13.6 % (ref 12.0–46.0)
Lymphs Abs: 0.6 K/uL — ABNORMAL LOW (ref 0.7–4.0)
MCHC: 34.2 g/dL (ref 30.0–36.0)
MCV: 101.6 fl — ABNORMAL HIGH (ref 78.0–100.0)
Monocytes Absolute: 0.4 K/uL (ref 0.1–1.0)
Monocytes Relative: 9.3 % (ref 3.0–12.0)
Neutro Abs: 3.4 K/uL (ref 1.4–7.7)
Neutrophils Relative %: 76.2 % (ref 43.0–77.0)
Platelets: 233 K/uL (ref 150.0–400.0)
RBC: 4.15 Mil/uL — ABNORMAL LOW (ref 4.22–5.81)
RDW: 14 % (ref 11.5–15.5)
WBC: 4.5 K/uL (ref 4.0–10.5)

## 2024-10-26 LAB — SEDIMENTATION RATE: Sed Rate: 18 mm/h (ref 0–20)

## 2024-10-26 LAB — TSH: TSH: 3.41 u[IU]/mL (ref 0.35–5.50)

## 2024-11-05 ENCOUNTER — Ambulatory Visit: Payer: Self-pay

## 2024-11-05 ENCOUNTER — Ambulatory Visit: Admitting: Family Medicine
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
## 2024-11-08 ENCOUNTER — Emergency Department (HOSPITAL_COMMUNITY)

## 2024-11-08 ENCOUNTER — Emergency Department (HOSPITAL_COMMUNITY)
Admission: EM | Admit: 2024-11-08 | Discharge: 2024-11-09 | Disposition: A | Discharge status: Home / Self Care | Attending: Emergency Medicine | Admitting: Emergency Medicine

## 2024-11-08 ENCOUNTER — Other Ambulatory Visit: Payer: Self-pay

## 2024-11-08 DIAGNOSIS — R4182 Altered mental status, unspecified: Secondary | ICD-10-CM | POA: Insufficient documentation

## 2024-11-08 DIAGNOSIS — G3184 Mild cognitive impairment, so stated: Secondary | ICD-10-CM

## 2024-11-08 DIAGNOSIS — Z87891 Personal history of nicotine dependence: Secondary | ICD-10-CM | POA: Insufficient documentation

## 2024-11-08 DIAGNOSIS — F32A Depression, unspecified: Secondary | ICD-10-CM | POA: Diagnosis not present

## 2024-11-08 DIAGNOSIS — F319 Bipolar disorder, unspecified: Secondary | ICD-10-CM

## 2024-11-08 DIAGNOSIS — R410 Disorientation, unspecified: Secondary | ICD-10-CM

## 2024-11-08 DIAGNOSIS — F0390 Unspecified dementia without behavioral disturbance: Secondary | ICD-10-CM

## 2024-11-08 LAB — BLOOD GAS, VENOUS
Acid-Base Excess: 6.7 mmol/L — ABNORMAL HIGH (ref 0.0–2.0)
Bicarbonate: 33.3 mmol/L — ABNORMAL HIGH (ref 20.0–28.0)
O2 Saturation: 31.9 %
Patient temperature: 37
pCO2, Ven: 55 mmHg (ref 44–60)
pH, Ven: 7.39 (ref 7.25–7.43)
pO2, Ven: <31 mmHg — CL (ref 32–45)

## 2024-11-08 LAB — URINE DRUG SCREEN
Amphetamines: NEGATIVE
Barbiturates: NEGATIVE
Benzodiazepines: NEGATIVE
Cocaine: NEGATIVE
Fentanyl: NEGATIVE
Methadone Scn, Ur: NEGATIVE
Opiates: NEGATIVE
Tetrahydrocannabinol: NEGATIVE

## 2024-11-08 LAB — COMPREHENSIVE METABOLIC PANEL WITH GFR
ALT: 14 U/L (ref 0–44)
AST: 22 U/L (ref 15–41)
Albumin: 3.6 g/dL (ref 3.5–5.0)
Alkaline Phosphatase: 64 U/L (ref 38–126)
Anion gap: 11 (ref 5–15)
BUN: 8 mg/dL (ref 8–23)
CO2: 25 mmol/L (ref 22–32)
Calcium: 8.5 mg/dL — ABNORMAL LOW (ref 8.9–10.3)
Chloride: 99 mmol/L (ref 98–111)
Creatinine, Ser: 0.54 mg/dL — ABNORMAL LOW (ref 0.61–1.24)
GFR, Estimated: >60 mL/min
Glucose, Bld: 99 mg/dL (ref 70–99)
Potassium: 4.2 mmol/L (ref 3.5–5.1)
Sodium: 134 mmol/L — ABNORMAL LOW (ref 135–145)
Total Bilirubin: 0.3 mg/dL (ref 0.0–1.2)
Total Protein: 6.5 g/dL (ref 6.5–8.1)

## 2024-11-08 LAB — URINALYSIS, ROUTINE W REFLEX MICROSCOPIC
Bilirubin Urine: NEGATIVE
Glucose, UA: NEGATIVE mg/dL
Hgb urine dipstick: NEGATIVE
Ketones, ur: NEGATIVE mg/dL
Leukocytes,Ua: NEGATIVE
Nitrite: NEGATIVE
Protein, ur: NEGATIVE mg/dL
Specific Gravity, Urine: 1.01 (ref 1.005–1.030)
pH: 7 (ref 5.0–8.0)

## 2024-11-08 LAB — CBC WITH DIFFERENTIAL/PLATELET
Abs Immature Granulocytes: 0.01 K/uL (ref 0.00–0.07)
Basophils Absolute: 0.1 K/uL (ref 0.0–0.1)
Basophils Relative: 1 %
Eosinophils Absolute: 0 K/uL (ref 0.0–0.5)
Eosinophils Relative: 0 %
HCT: 40.3 % (ref 39.0–52.0)
Hemoglobin: 14.3 g/dL (ref 13.0–17.0)
Immature Granulocytes: 0 %
Lymphocytes Relative: 17 %
Lymphs Abs: 0.7 K/uL (ref 0.7–4.0)
MCH: 36 pg — ABNORMAL HIGH (ref 26.0–34.0)
MCHC: 35.5 g/dL (ref 30.0–36.0)
MCV: 101.5 fL — ABNORMAL HIGH (ref 80.0–100.0)
Monocytes Absolute: 0.3 K/uL (ref 0.1–1.0)
Monocytes Relative: 8 %
Neutro Abs: 3.2 K/uL (ref 1.7–7.7)
Neutrophils Relative %: 74 %
Platelets: 328 K/uL (ref 150–400)
RBC: 3.97 MIL/uL — ABNORMAL LOW (ref 4.22–5.81)
RDW: 14 % (ref 11.5–15.5)
WBC: 4.4 K/uL (ref 4.0–10.5)
nRBC: 0 % (ref 0.0–0.2)

## 2024-11-08 LAB — MAGNESIUM: Magnesium: 2.2 mg/dL (ref 1.7–2.4)

## 2024-11-08 LAB — TROPONIN T, HIGH SENSITIVITY: Troponin T High Sensitivity: <15 ng/L (ref 0–19)

## 2024-11-08 LAB — CBG MONITORING, ED: Glucose-Capillary: 113 mg/dL — ABNORMAL HIGH (ref 70–99)

## 2024-11-08 LAB — ETHANOL: Alcohol, Ethyl (B): <15 mg/dL

## 2024-11-08 LAB — AMMONIA: Ammonia: 53 umol/L — ABNORMAL HIGH (ref 9–35)

## 2024-11-08 MED ORDER — ARIPIPRAZOLE 5 MG PO TABS
5.0000 mg | ORAL_TABLET | Freq: Every day | ORAL | Status: DC
  Administered 2024-11-08: 5 mg via ORAL
  Filled 2024-11-08 (×2): qty 1

## 2024-11-08 MED ORDER — HALOPERIDOL LACTATE 5 MG/ML IJ SOLN
5.0000 mg | Freq: Once | INTRAMUSCULAR | Status: AC
  Administered 2024-11-08: 5 mg via INTRAVENOUS
  Filled 2024-11-08: qty 1

## 2024-11-08 MED ORDER — DIPHENHYDRAMINE HCL 50 MG/ML IJ SOLN
12.5000 mg | Freq: Once | INTRAMUSCULAR | Status: AC
  Administered 2024-11-08: 12.5 mg via INTRAVENOUS
  Filled 2024-11-08: qty 1

## 2024-11-08 MED ORDER — FLUOXETINE HCL 40 MG PO CAPS: 40.0000 mg | ORAL_CAPSULE | Freq: Every day | ORAL | Status: DC

## 2024-11-08 MED ORDER — LORAZEPAM 2 MG/ML IJ SOLN
1.0000 mg | Freq: Once | INTRAMUSCULAR | Status: AC
  Administered 2024-11-08: 1 mg via INTRAVENOUS
  Filled 2024-11-08: qty 1

## 2024-11-08 MED ORDER — CLOPIDOGREL BISULFATE 75 MG PO TABS
75.0000 mg | ORAL_TABLET | Freq: Every day | ORAL | Status: DC
  Administered 2024-11-08: 75 mg via ORAL
  Filled 2024-11-08 (×2): qty 1

## 2024-11-08 MED ORDER — BUSPIRONE HCL 5 MG PO TABS: 7.5000 mg | ORAL_TABLET | Freq: Two times a day (BID) | ORAL | Status: DC

## 2024-11-08 MED ORDER — ROSUVASTATIN CALCIUM 20 MG PO TABS
40.0000 mg | ORAL_TABLET | Freq: Every day | ORAL | Status: DC
  Administered 2024-11-08: 40 mg via ORAL
  Filled 2024-11-08 (×2): qty 2

## 2024-11-08 NOTE — ED Notes (Signed)
 PT urinated on himself in bed; NT assisted PT with changing into paper scrubs and a brief PT was also placed onto monitor as well as pulse ox and BP cuff per parademic's & providers request.

## 2024-11-08 NOTE — ED Notes (Signed)
 PT sitting in chair beside bed. He is now easy directable but continues asking about leaving

## 2024-11-08 NOTE — ED Notes (Signed)
 Pt continues to call out for help. When asking Pt what he needs, Pt attempts to hand phone to writer to talk to wife. Wife has continously called the hospital toi ask for updates on Pt, and has been given updates, however wife became angry when writer was not able to immediately answer the phone. Wife became belligerent several times with me and unit secretary over the phone. This time, wife was told that she was noty going to scream at clinical research associate over the phone, and phone was given back to patient
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
## 2024-11-08 NOTE — Consult Note (Signed)
 [facility] Consult Note  Patient Name: Richard Fernandez MRN: [id number] DOB: 26-Jul-1941 DATE OF Consult: 11/08/2024 Consult Order details:  Orders (From admission, onward)     Start     Ordered   11/08/24 1913  CONSULT TO CALL ACT TEAM       Comments: Cognitive decline in both patient and wife. Patient brought in to ED after driving down the wrong way on [address]. There have been recent concerns of increased agitation in patient's wife at home and concern for patient's safety.  Ordering Provider: Melvenia Motto, MD  Provider:  (Not yet assigned)  Question:  Reason for Consult?  Answer:  Placement   11/08/24 1915            PRIMARY PSYCHIATRIC DIAGNOSES  1.  Depression per the patient 2.  Bipolar per the chart 3.  Cognitive impairment with memory problems per his pcp  RECOMMENDATIONS  Recommendations: Medication recommendations: Continue home meds as already ordered  Prozac  40 mg daily for depression, Abilify  5 mg for mood,  Buspar  7.5 mg for anxiety Non-Medication/therapeutic recommendations: Can likely go home. I'm trying to contact wife to see how she has been managing him.  Is inpatient psychiatric hospitalization recommended for this patient? No (Explain why): There does not appear to be an acute psychiatric issue From a psychiatric perspective, is this patient appropriate for discharge to an outpatient setting/resource or other less restrictive environment for continued care?  Yes (Explain why): as long as his wife is able to continue managing him. He needs to have his licensed taken away and some follow up for dementia. SW may need to contact wife. I was unable to reach her x2  Sonny [phone].  Follow-Up Telepsychiatry C/L services: We will sign off for now. Please re-consult our service if needed for any concerning changes in the patient's condition, discharge planning, or questions. Communication: Treatment team members (and family members if applicable) who were  involved in treatment/care discussions and planning, and with whom we spoke or engaged with via secure text/chat, include the following: treatment team via Epic Chat.  I personally spent a total of 60 minutes in the care of the patient today including preparing to see the patient, getting/reviewing separately obtained history, performing a medically appropriate exam/evaluation, referring and communicating with other health care professionals, documenting clinical information in the EHR, independently interpreting results, and coordinating care.  Thank you for involving us  in the care of this patient. If you have any additional questions or concerns, please call [phone] and ask for me or the provider on-call.  TELEPSYCHIATRY ATTESTATION & CONSENT  As the provider for this telehealth consult, I attest that I verified the patients identity using two separate identifiers, introduced myself to the patient, provided my credentials, disclosed my location, and performed this encounter via a HIPAA-compliant, real-time, face-to-face, two-way, interactive audio and video platform and with the full consent and agreement of the patient (or guardian as applicable.)  Patient physical location: [facility]. Telehealth provider physical location: home office in state of [place] .  Video start time: 2[facility] Time) Video end time: 2[facility] Time)  IDENTIFYING DATA  Richard Fernandez is a 84 y.o. year-old male for whom a psychiatric consultation has been ordered by the primary provider. The patient was identified using two separate identifiers.  CHIEF COMPLAINT/REASON FOR CONSULT  Stopped by police for driving on the wrong side of the road and was confused  HISTORY OF PRESENT ILLNESS (HPI)  The patient knows why  he is hear. He seems to attribute his driving on the wrong side of the road to his tremor. He is aware that he has had the tremor for a long time but it was worse today.   PAST PSYCHIATRIC HISTORY   Patient denies that he has been psychiatrically hospitalized. He denies that he has bipolar or PTSD. He states that he has had depression before. Current medications are Prozac , Abilify , Buspar  Denies any suicide attempts Otherwise as per HPI above.  PAST MEDICAL HISTORY  Past Medical History:  Diagnosis Date   CEREBROVASCULAR ACCIDENT, HX OF 11/29/2010   DEPRESSION 11/29/2010   Hearing loss    HYPERLIPIDEMIA 11/29/2010   OBSTRUCTIVE SLEEP APNEA 11/29/2010   CPAP   PTSD (post-traumatic stress disorder)    Stroke [facility])      HOME MEDICATIONS  Facility Ordered Medications  Medication   [COMPLETED] LORazepam  (ATIVAN ) injection 1 mg   ARIPiprazole  (ABILIFY ) tablet 5 mg   busPIRone  (BUSPAR ) tablet 7.5 mg   clopidogrel  (PLAVIX ) tablet 75 mg   FLUoxetine  (PROZAC ) capsule 40 mg   rosuvastatin  (CRESTOR ) tablet 40 mg   PTA Medications  Medication Sig   Hypromellose (ARTIFICIAL TEARS OP) Place 1 drop into both eyes daily as needed (dry eyes).   busPIRone  (BUSPAR ) 7.5 MG tablet TAKE 1 TABLET(7.5 MG) BY MOUTH TWICE DAILY   clopidogrel  (PLAVIX ) 75 MG tablet TAKE 1 TABLET(75 MG) BY MOUTH DAILY   sildenafil  (VIAGRA ) 50 MG tablet TAKE 1 TABLET(50 MG) BY MOUTH DAILY AS NEEDED FOR ERECTILE DYSFUNCTION   ARIPiprazole  (ABILIFY ) 5 MG tablet Take 1 tablet (5 mg total) by mouth daily.   FLUoxetine  (PROZAC ) 40 MG capsule TAKE 1 CAPSULE(40 MG) BY MOUTH DAILY   rosuvastatin  (CRESTOR ) 40 MG tablet TAKE 1 TABLET(40 MG) BY MOUTH DAILY     ALLERGIES  Allergies[1]  SOCIAL & SUBSTANCE USE HISTORY  Social History   Socioeconomic History   Marital status: Married    Spouse name: Sonny    Number of children: 1   Years of education: college   Highest education level: Not on file  Occupational History   Occupation: part time counselor   Tobacco Use   Smoking status: Former    Current packs/day: 0.00    Average packs/day: 1.5 packs/day for 20.0 years (30.0 ttl pk-yrs)    Types: Cigarettes    Start  date: 11/04/1968    Quit date: 11/04/1988    Years since quitting: 36.0   Smokeless tobacco: Never  Vaping Use   Vaping status: Never Used  Substance and Sexual Activity   Alcohol use: No    Alcohol/week: 0.0 standard drinks of alcohol   Drug use: No   Sexual activity: Not on file  Other Topics Concern   Not on file  Social History Narrative   Pts is adopted. Patient lives at home with his Sonny.   Retired but still working some.   Education some college.   Left handed.   Caffeine four cups of coffee daily.      Social Drivers of Health   Tobacco Use: Medium Risk (10/25/2024)   Patient History    Smoking Tobacco Use: Former    Smokeless Tobacco Use: Never    Passive Exposure: Not on file  Financial Resource Strain: Low Risk (09/17/2022)   Overall Financial Resource Strain (CARDIA)    Difficulty of Paying Living Expenses: Not very hard  Food Insecurity: Not on file  Transportation Needs: Not on file  Physical Activity: Not on file  Stress: Not on file  Social Connections: Not on file  Depression (PHQ2-9): Medium Risk (08/11/2023)   Depression (PHQ2-9)    PHQ-2 Score: 5  Alcohol Screen: Not on file  Housing: Not on file  Utilities: Not on file  Health Literacy: Not on file   Tobacco Use History[2] Social History   Substance and Sexual Activity  Alcohol Use No   Alcohol/week: 0.0 standard drinks of alcohol   Social History   Substance and Sexual Activity  Drug Use No    Additional pertinent information Patient was driving on the wrong side of the road. He should not have a drivers license.  FAMILY HISTORY  Family History  Adopted: Yes  Problem Relation Age of Onset   Breast cancer Mother    Heart failure Father    Family Psychiatric History (if known):  unknown  MENTAL STATUS EXAM (MSE)  Mental Status Exam: General Appearance: Casual  Orientation:  Other:  patient is aware of the date and why he is in the ED  Memory:  patient denies memory issues then  states that he has in the past but none that he is aware of currently, per chart review he has memory impairment and cognitive disability  Concentration:  Concentration: Fair  Recall:  NA  Attention  Poor  Eye Contact:  Fair  Speech:  Clear and Coherent and Slow  Language:  Fair  Volume:  Normal  Mood: anxious  Affect:  Blunt  Thought Process:  Goal Directed wants to go home  Thought Content:  Illogical  Suicidal Thoughts:  No  Homicidal Thoughts:  No  Judgement:  Poor  Insight:  Lacking  Psychomotor Activity:  Tremor, shuffling gait  Akathisia:  No  Fund of Knowledge:  Poor    Assets:  Housing  Cognition:  Impaired,  Mild  ADL's:  Intact  AIMS (if indicated):       VITALS  Blood pressure 118/74, pulse 69, temperature (!) 97.5 F (36.4 C), temperature source Oral, resp. rate (!) 22, SpO2 99%.  LABS  Admission on 11/08/2024  Component Date Value Ref Range Status   Glucose-Capillary 11/08/2024 113 (H)  70 - 99 mg/dL Final   Glucose reference range applies only to samples taken after fasting for at least 8 hours.   WBC 11/08/2024 4.4  4.0 - 10.5 K/uL Final   RBC 11/08/2024 3.97 (L)  4.22 - 5.81 MIL/uL Final   Hemoglobin 11/08/2024 14.3  13.0 - 17.0 g/dL Final   HCT 98/94/7973 40.3  39.0 - 52.0 % Final   MCV 11/08/2024 101.5 (H)  80.0 - 100.0 fL Final   MCH 11/08/2024 36.0 (H)  26.0 - 34.0 pg Final   MCHC 11/08/2024 35.5  30.0 - 36.0 g/dL Final   RDW 98/94/7973 14.0  11.5 - 15.5 % Final   Platelets 11/08/2024 328  150 - 400 K/uL Final   nRBC 11/08/2024 0.0  0.0 - 0.2 % Final   Neutrophils Relative % 11/08/2024 74  % Final   Neutro Abs 11/08/2024 3.2  1.7 - 7.7 K/uL Final   Lymphocytes Relative 11/08/2024 17  % Final   Lymphs Abs 11/08/2024 0.7  0.7 - 4.0 K/uL Final   Monocytes Relative 11/08/2024 8  % Final   Monocytes Absolute 11/08/2024 0.3  0.1 - 1.0 K/uL Final   Eosinophils Relative 11/08/2024 0  % Final   Eosinophils Absolute 11/08/2024 0.0  0.0 - 0.5 K/uL Final    Basophils Relative 11/08/2024 1  % Final  Basophils Absolute 11/08/2024 0.1  0.0 - 0.1 K/uL Final   Immature Granulocytes 11/08/2024 0  % Final   Abs Immature Granulocytes 11/08/2024 0.01  0.00 - 0.07 K/uL Final   Performed at [facility], [address], [place], [place] [address]   Color, Urine 11/08/2024 YELLOW  YELLOW Final   APPearance 11/08/2024 CLEAR  CLEAR Final   Specific Gravity, Urine 11/08/2024 1.010  1.005 - 1.030 Final   pH 11/08/2024 7.0  5.0 - 8.0 Final   Glucose, UA 11/08/2024 NEGATIVE  NEGATIVE mg/dL Final   Hgb urine dipstick 11/08/2024 NEGATIVE  NEGATIVE Final   Bilirubin Urine 11/08/2024 NEGATIVE  NEGATIVE Final   Ketones, ur 11/08/2024 NEGATIVE  NEGATIVE mg/dL Final   Protein, ur 98/94/7973 NEGATIVE  NEGATIVE mg/dL Final   Nitrite 98/94/7973 NEGATIVE  NEGATIVE Final   Leukocytes,Ua 11/08/2024 NEGATIVE  NEGATIVE Final   Performed at [facility], [address] [place], [place] [address]   Opiates 11/08/2024 NEGATIVE  NEGATIVE Final   Cocaine 11/08/2024 NEGATIVE  NEGATIVE Final   Benzodiazepines 11/08/2024 NEGATIVE  NEGATIVE Final   Amphetamines 11/08/2024 NEGATIVE  NEGATIVE Final   Tetrahydrocannabinol 11/08/2024 NEGATIVE  NEGATIVE Final   Barbiturates 11/08/2024 NEGATIVE  NEGATIVE Final   Methadone Scn, Ur 11/08/2024 NEGATIVE  NEGATIVE Final   Fentanyl 11/08/2024 NEGATIVE  NEGATIVE Final   Comment: (NOTE) Drug screen is for Medical Purposes only. Positive results are preliminary only. If confirmation is needed, notify lab within 5 days.  Drug Class                 Cutoff (ng/mL) Amphetamine and metabolites 1000 Barbiturate and metabolites 200 Benzodiazepine              200 Opiates and metabolites     300 Cocaine and metabolites     300 THC                         50 Fentanyl                    5 Methadone                   300  Trazodone is metabolized in vivo to several metabolites,  including pharmacologically  active m-CPP, which is excreted in the  urine.  Immunoassay screens for amphetamines and MDMA have potential  cross-reactivity with these compounds and may provide false positive  result.  Performed at [facility], [address] [place], [place] [address]    Alcohol, Ethyl (B) 11/08/2024 <15  <15 mg/dL Final   Comment: (NOTE) For medical purposes only. Performed at [facility], [address] [place], [place] [address]    pH, Ven 11/08/2024 7.39  7.25 - 7.43 Final   pCO2, Ven 11/08/2024 55  44 - 60 mmHg Final   pO2, Ven 11/08/2024 <31 (LL)  32 - 45 mmHg Final   Comment: CRITICAL RESULT CALLED TO, READ BACK BY AND VERIFIED WITH: M HINCKLEY RN ON 11/08/2024 AT 1730 BY SLASIGAN    Bicarbonate 11/08/2024 33.3 (H)  20.0 - 28.0 mmol/L Final   Acid-Base Excess 11/08/2024 6.7 (H)  0.0 - 2.0 mmol/L Final   O2 Saturation 11/08/2024 31.9  % Final   Patient temperature 11/08/2024 37.0   Final   Performed at [facility], [address] [place], [place] [address]   Troponin T High Sensitivity 11/08/2024 <15  0 - 19 ng/L Final   Comment: (NOTE) Biotin concentrations > 1000 ng/mL falsely decrease TnT results.  Serial cardiac troponin measurements are suggested.  Refer to the Links section for chest pain algorithms and additional  guidance. Performed at [facility], [address], [place], [place] [address]    Sodium 11/08/2024 134 (L)  135 - 145 mmol/L Final   Potassium 11/08/2024 4.2  3.5 - 5.1 mmol/L Final   HEMOLYSIS AT THIS LEVEL MAY AFFECT RESULT   Chloride 11/08/2024 99  98 - 111 mmol/L Final   CO2 11/08/2024 25  22 - 32 mmol/L Final   Glucose, Bld 11/08/2024 99  70 - 99 mg/dL Final   Glucose reference range applies only to samples taken after fasting for at least 8 hours.   BUN 11/08/2024 8  8 - 23 mg/dL Final   Creatinine, Ser 11/08/2024 0.54 (L)  0.61 - 1.24 mg/dL Final   Calcium  11/08/2024 8.5  (L)  8.9 - 10.3 mg/dL Final   Total Protein 98/94/7973 6.5  6.5 - 8.1 g/dL Final   Albumin 98/94/7973 3.6  3.5 - 5.0 g/dL Final   AST 98/94/7973 22  15 - 41 U/L Final   HEMOLYSIS AT THIS LEVEL MAY AFFECT RESULT   ALT 11/08/2024 14  0 - 44 U/L Final   Alkaline Phosphatase 11/08/2024 64  38 - 126 U/L Final   Total Bilirubin 11/08/2024 0.3  0.0 - 1.2 mg/dL Final   GFR, Estimated 11/08/2024 >60  >60 mL/min Final   Comment: (NOTE) Calculated using the CKD-EPI Creatinine Equation (2021)    Anion gap 11/08/2024 11  5 - 15 Final   Performed at [facility], [address] [place], [place] [address]   Magnesium 11/08/2024 2.2  1.7 - 2.4 mg/dL Final   Performed at [facility], [address] [place], [place] [address]    PSYCHIATRIC REVIEW OF SYSTEMS (ROS)  ROS: Notable for the following relevant positive findings: ROS  Additional findings:      Musculoskeletal: Impaired      Gait & Station: Laying/Sitting      Pain Screening: Denies      Nutrition & Dental Concerns: no concerns, per chart review his pcp is aware of weight loss over the past year  RISK FORMULATION/ASSESSMENT  Is the patient experiencing any suicidal or homicidal ideations: No  Protective factors considered for safety management: not SI, future oriented  Risk factors/concerns considered for safety management:  Physical illness/chronic pain Age over 74 Impulsivity Male gender  Is there a safety management plan with the patient and treatment team to minimize risk factors and promote protective factors: Yes           Explain: monitor in the ED Is crisis care placement or psychiatric hospitalization recommended: No     Based on my current evaluation and risk assessment, patient is determined at this time to be at:  Low risk  *RISK ASSESSMENT Risk assessment is a dynamic process; it is possible that this patient's condition, and risk level, may change. This should be re-evaluated  and managed over time as appropriate. Please re-consult psychiatric consult services if additional assistance is needed in terms of risk assessment and management. If your team decides to discharge this patient, please advise the patient how to best access emergency psychiatric services, or to call 911, if their condition worsens or they feel unsafe in any way.   Mliss DELENA Chihuahua, NP Telepsychiatry Consult Services    [  1] No Known Allergies [2]  Social History Tobacco Use  Smoking Status Former   Current packs/day: 0.00   Average packs/day: 1.5 packs/day for 20.0 years (30.0 ttl pk-yrs)   Types: Cigarettes   Start date: 11/04/1968   Quit date: 11/04/1988   Years since quitting: 36.0  Smokeless Tobacco Never

## 2024-11-08 NOTE — BH Assessment (Signed)
 Patient was deferred to [facility] for a telepsych assessment. The assigned care coordinator will provide updates regarding the scheduling of the assessment. [facility] coordinator can be reached at [phone] for further information on the timing of the telepsych evaluation.

## 2024-11-08 NOTE — ED Notes (Signed)
 PT done with TTS conversation & immediately put his regular clothes back on and said that the provider he spoke with said that he is free to go. Also stated that the provider spoke with his wife and that he is calling a taxi cab home now; PT is sure that he is going home now at this moment; NT unaware of this to be true or not; Parademic made aware of situation.

## 2024-11-08 NOTE — ED Notes (Signed)
 Pt still on the phone with his wife and both are demanding that the wife be spoken to and given answers. PT continues to get up from the bed and is only listening to what the wife has to say; parademic and charge RN made aware.

## 2024-11-08 NOTE — ED Notes (Signed)
 Patient transported to MRI

## 2024-11-09 ENCOUNTER — Ambulatory Visit: Admitting: Family Medicine

## 2024-11-09 ENCOUNTER — Ambulatory Visit: Payer: Self-pay

## 2024-11-09 NOTE — ED Notes (Signed)
 Pt sleeping.

## 2024-11-09 NOTE — ED Notes (Signed)
 Pt now in bed laying down.

## 2024-11-09 NOTE — ED Provider Notes (Signed)
 Pt with hx dementia, has been boarding in ED overnight.   Pt indicates feels ready for d/c. Denies acute physical or mental health issue or complaint.   Will ask TOC to facilitate/coordinate transport home for patient, possibly with home health services in place.      Briunna Leicht, MD 11/09/24 [phone]

## 2024-11-09 NOTE — ED Notes (Signed)
 Wife called requesting to speak with pt. Wife has called several times tonight, arguing with staff.

## 2024-11-09 NOTE — ED Notes (Signed)
 Wife again called the ED, upset that no one has called his family. Wife was explained that Pt is under IVC order tonight to see social worker in the morning. Wife was yelling at clinical research associate on the phone, stating that she wants to know what is going on with husband. Explained to wife that I didn't have any more information to give other than that. Wife stated she was going to call the police on the hospital because no one is telling her what is going on.

## 2024-11-09 NOTE — ED Notes (Signed)
 Pt in bed; stated that he was hungry, NT provided PT with a sandwhich per parademic approval.

## 2024-11-09 NOTE — ED Notes (Signed)
"  Pt is sleeping  "

## 2024-11-09 NOTE — Discharge Instructions (Signed)
 It was our pleasure to provide your ER care today - we hope that you feel better.  Follow up closely with primary care doctor in one week - discuss with them continued coordination of home health services, and/or discuss with them future plan if/when you desire possible transition to an extended care facility.  No driving until cleared to do so by your doctor.   Return to ER if worse, new symptoms, fevers, new/severe pain, chest pain, trouble breathing, or other concern.

## 2024-11-09 NOTE — Progress Notes (Signed)
" °   11/09/24 0848  TOC ED Mini Assessment  TOC Time spent with patient (minutes): 20  Admission or Readmission Diverted Yes  Barriers to Discharge No Barriers Identified  Means of departure Car (spouse/family friend to transport)  Choice offered to / list presented to  NA   RNCM spoke to patient's spouse regarding d/c plans/home health needs. Spouse stated that no needs for home health at this time and spouse or family friend will transport patient home this am upon discharge. Will continue to follow. "

## 2024-11-09 NOTE — Telephone Encounter (Signed)
 FYI Only or Action Required?: Action required by provider: request for appointment.  Patient was last seen in primary care on 10/25/2024 by Micheal Wolm ORN, MD.  Called Nurse Triage reporting Altered Mental Status.  Symptoms began several months ago.  Interventions attempted: Nothing.  Symptoms are: gradually worsening. Son reports pt. Has had confusion, but is getting worse. Was in ED yesterday after driving the wrong way down the road.Son, Warren , lives in [place] . [phone].  Triage Disposition: See Physician Within 24 Hours  Patient/caregiver understands and will follow disposition?:     Copied from CRM [id number]. Topic: Clinical - Red Word Triage >> Nov 09, 2024 11:33 AM Deleta RAMAN wrote: Red Word that prompted transfer to Nurse Triage: has confusion went to er and doctors told he has confusion/ dementia. patient son is calling not on dpr originally calling to cancel patient visit on today Answer Assessment - Initial Assessment Questions 1. LEVEL OF CONSCIOUSNESS: How are they (the patient) acting right now? (e.g., alert-oriented, confused, lethargic, stuporous, comatose)     OK today 2. ONSET: When did the confusion start?  (e.g., minutes, hours, days)     For a few months 3. PATTERN: Does this come and go, or has it been constant since it started?  Is it present now?     Comes and goes 4. ALCOHOL or DRUGS: Have they been drinking alcohol or taking any drugs?      no 5. NARCOTIC MEDICINES: Have they been receiving any narcotic medications? (e.g., morphine, Vicodin)     no 6. CAUSE: What do you think is causing the confusion?      unsure 7. OTHER SYMPTOMS: Are there any other symptoms? (e.g., difficulty breathing, fever, headache, weakness)     no  Protocols used: Confusion - Delirium-A-AH

## 2024-11-09 NOTE — ED Notes (Signed)
 PT still sleeping comfortably

## 2024-11-09 NOTE — ED Notes (Addendum)
 Ambulated pt to bathroom went back to sleep once in bed

## 2024-11-09 NOTE — Telephone Encounter (Signed)
 Left message for the patient to return my call.

## 2024-11-09 NOTE — ED Notes (Signed)
 Wife has called several times. Wife called again saying she will call the Police to bring her here in the ED. She has been raising her voice towards staff and keeps saying that no one has spoken to her and has given her any updates.

## 2024-11-10 ENCOUNTER — Telehealth: Payer: Self-pay | Admitting: Family Medicine

## 2024-11-10 NOTE — Telephone Encounter (Signed)
"   I spoke with the patient's son Lynwood and advised him to have patient schedule an appointment and if possible be in attendance with the patient . Lynwood informed me that he does not think this is possible as he resides in [place]. Lynwood informed me that patient will be with spouse for her upcoming appt on 11/13/2023 and DPR will be provided to patient and his wife at that time.  "

## 2024-11-10 NOTE — Telephone Encounter (Signed)
 I spoke with the patient and he reported he is feeling safe at home and will be in the office with his spouse for her upcoming appt on 11/13/2023

## 2024-11-10 NOTE — Telephone Encounter (Unsigned)
 Copied from CRM [id number]. Topic: Clinical - Refused Triage >> Nov 10, 2024  4:48 PM China J wrote: Patient calling in to go over his appointments but then became very confused once I mentioned his hospital visit on the 5th and wondering if he would like to see Dr. Micheal. Patient was going to be seen for confusion originally. Declined transfer to triage.

## 2024-11-12 ENCOUNTER — Encounter: Admitting: Family Medicine

## 2024-11-12 ENCOUNTER — Other Ambulatory Visit: Payer: Self-pay | Admitting: Family Medicine

## 2024-11-12 ENCOUNTER — Inpatient Hospital Stay: Admitting: Family Medicine

## 2024-11-12 DIAGNOSIS — E7849 Other hyperlipidemia: Secondary | ICD-10-CM

## 2024-11-12 MED ORDER — ROSUVASTATIN CALCIUM 40 MG PO TABS: ORAL_TABLET | ORAL | 1 refills | Status: AC

## 2024-11-12 MED ORDER — ARIPIPRAZOLE 5 MG PO TABS: 5.0000 mg | ORAL_TABLET | Freq: Every day | ORAL | 3 refills | Status: AC

## 2024-11-12 MED ORDER — FLUOXETINE HCL 40 MG PO CAPS: ORAL_CAPSULE | ORAL | 3 refills | Status: AC

## 2024-11-12 MED ORDER — CLOPIDOGREL BISULFATE 75 MG PO TABS: ORAL_TABLET | ORAL | 3 refills | Status: AC

## 2024-11-12 NOTE — Progress Notes (Signed)
 This encounter was created in error - please disregard.

## 2024-11-19 ENCOUNTER — Encounter: Payer: Self-pay | Admitting: Family Medicine

## 2024-11-19 ENCOUNTER — Ambulatory Visit: Admitting: Family Medicine

## 2024-11-19 VITALS — BP 98/56 | HR 59 | Temp 97.5°F | Wt 168.6 lb

## 2024-11-19 DIAGNOSIS — R4189 Other symptoms and signs involving cognitive functions and awareness: Secondary | ICD-10-CM

## 2024-11-19 DIAGNOSIS — R251 Tremor, unspecified: Secondary | ICD-10-CM

## 2024-11-19 DIAGNOSIS — R413 Other amnesia: Secondary | ICD-10-CM | POA: Diagnosis not present
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
## 2024-11-23 ENCOUNTER — Telehealth: Payer: Self-pay | Admitting: *Deleted

## 2024-11-23 NOTE — Telephone Encounter (Signed)
 Copied from CRM #[id number]. Topic: Appointments - Appointment Info/Confirmation >> Nov 23, 2024  3:41 PM Mercedes MATSU wrote: Patients son Lynwood Brought is requesting a call back from the dr or his nurse to go over the patients last visit. He can be reached at [phone].

## 2024-11-23 NOTE — Telephone Encounter (Signed)
 Patient's son Lynwood informed of patient last visit and PCP recommendations.

## 2024-11-23 NOTE — Telephone Encounter (Signed)
 Copied from CRM #[id number]. Topic: Appointments - Appointment Info/Confirmation >> Nov 23, 2024  3:41 PM Mercedes MATSU wrote: Patients son Richard Fernandez is requesting a call back from the dr or his nurse to go over the patients last visit. He can be reached at [phone].

## 2024-12-10 ENCOUNTER — Encounter: Payer: Self-pay | Admitting: Family Medicine

## 2024-12-10 ENCOUNTER — Ambulatory Visit: Admitting: Family Medicine
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
